# Patient Record
Sex: Male | Born: 1965
Health system: Southern US, Community
[De-identification: ages and names within clinical notes are randomized; demographics above are authoritative.]

## PROBLEM LIST (undated history)

## (undated) DIAGNOSIS — E119 Type 2 diabetes mellitus without complications: Secondary | ICD-10-CM

## (undated) DIAGNOSIS — F209 Schizophrenia, unspecified: Secondary | ICD-10-CM

## (undated) DIAGNOSIS — N179 Acute kidney failure, unspecified: Secondary | ICD-10-CM

---

## 2007-06-01 ENCOUNTER — Emergency Department: Payer: Self-pay | Admitting: Emergency Medicine

## 2007-06-08 ENCOUNTER — Inpatient Hospital Stay: Payer: Self-pay | Admitting: Psychiatry

## 2008-04-10 ENCOUNTER — Ambulatory Visit: Payer: Self-pay | Admitting: Internal Medicine

## 2008-04-29 ENCOUNTER — Ambulatory Visit: Payer: Self-pay | Admitting: Oncology

## 2008-05-15 ENCOUNTER — Ambulatory Visit: Payer: Self-pay | Admitting: Internal Medicine

## 2008-05-27 ENCOUNTER — Ambulatory Visit: Payer: Self-pay | Admitting: Oncology

## 2008-06-11 ENCOUNTER — Ambulatory Visit: Payer: Self-pay | Admitting: Gastroenterology

## 2008-06-13 ENCOUNTER — Ambulatory Visit: Payer: Self-pay | Admitting: Oncology

## 2008-06-17 ENCOUNTER — Ambulatory Visit: Payer: Self-pay | Admitting: Oncology

## 2008-06-30 ENCOUNTER — Ambulatory Visit: Payer: Self-pay | Admitting: Oncology

## 2008-07-29 ENCOUNTER — Ambulatory Visit: Payer: Self-pay | Admitting: Internal Medicine

## 2008-11-04 ENCOUNTER — Emergency Department: Payer: Self-pay | Admitting: Emergency Medicine

## 2009-02-25 ENCOUNTER — Emergency Department: Payer: Self-pay | Admitting: Emergency Medicine

## 2009-06-13 ENCOUNTER — Emergency Department: Payer: Self-pay | Admitting: Emergency Medicine

## 2009-06-29 ENCOUNTER — Emergency Department: Payer: Self-pay | Admitting: Emergency Medicine

## 2010-02-09 ENCOUNTER — Ambulatory Visit: Payer: Self-pay | Admitting: Emergency Medicine

## 2010-02-12 LAB — PATHOLOGY REPORT

## 2010-06-22 ENCOUNTER — Inpatient Hospital Stay: Payer: Self-pay | Admitting: Psychiatry

## 2010-11-22 ENCOUNTER — Emergency Department: Payer: Self-pay | Admitting: Emergency Medicine

## 2011-02-12 ENCOUNTER — Emergency Department: Payer: Self-pay | Admitting: Emergency Medicine

## 2012-05-12 ENCOUNTER — Emergency Department: Payer: Self-pay | Admitting: Emergency Medicine

## 2012-05-12 LAB — CBC
MCV: 87 fL (ref 80–100)
Platelet: 217 10*3/uL (ref 150–440)
RBC: 5.35 10*6/uL (ref 4.40–5.90)
RDW: 13.4 % (ref 11.5–14.5)
WBC: 5.4 10*3/uL (ref 3.8–10.6)

## 2012-05-12 LAB — URINALYSIS, COMPLETE
Bilirubin,UR: NEGATIVE
Blood: NEGATIVE
Glucose,UR: NEGATIVE mg/dL (ref 0–75)
Ketone: NEGATIVE
Leukocyte Esterase: NEGATIVE
Nitrite: NEGATIVE
Ph: 6 (ref 4.5–8.0)
Squamous Epithelial: <1

## 2012-05-12 LAB — COMPREHENSIVE METABOLIC PANEL
Albumin: 4.2 g/dL (ref 3.4–5.0)
Anion Gap: 7 (ref 7–16)
BUN: 11 mg/dL (ref 7–18)
Bilirubin,Total: 1.3 mg/dL — ABNORMAL HIGH (ref 0.2–1.0)
Chloride: 107 mmol/L (ref 98–107)
Co2: 29 mmol/L (ref 21–32)
Creatinine: 1.1 mg/dL (ref 0.60–1.30)
EGFR (African American): >60
Glucose: 166 mg/dL — ABNORMAL HIGH (ref 65–99)
Osmolality: 288 (ref 275–301)
Potassium: 4.1 mmol/L (ref 3.5–5.1)
SGOT(AST): 25 U/L (ref 15–37)
Sodium: 143 mmol/L (ref 136–145)
Total Protein: 7.5 g/dL (ref 6.4–8.2)

## 2012-05-12 LAB — DRUG SCREEN, URINE
Barbiturates, Ur Screen: NEGATIVE (ref ?–200)
Benzodiazepine, Ur Scrn: NEGATIVE (ref ?–200)
Methadone, Ur Screen: NEGATIVE (ref ?–300)
Opiate, Ur Screen: NEGATIVE (ref ?–300)
Phencyclidine (PCP) Ur S: NEGATIVE (ref ?–25)
Tricyclic, Ur Screen: NEGATIVE (ref ?–1000)

## 2012-05-12 LAB — DIFFERENTIAL
Basophil #: 0.1 10*3/uL (ref 0.0–0.1)
Eosinophil #: 0 10*3/uL (ref 0.0–0.7)
Lymphocyte #: 1.1 10*3/uL (ref 1.0–3.6)
Monocyte #: 0.3 x10 3/mm (ref 0.2–1.0)
Monocyte %: 4.5 %
Neutrophil #: 4.2 10*3/uL (ref 1.4–6.5)
Neutrophil %: 73.8 %

## 2012-05-12 LAB — ETHANOL
Ethanol %: <0.003 % (ref 0.000–0.080)
Ethanol: <3 mg/dL

## 2012-05-12 LAB — ACETAMINOPHEN LEVEL: Acetaminophen: <2 ug/mL

## 2012-05-13 LAB — LITHIUM LEVEL: Lithium: 0.76 mmol/L

## 2012-05-17 LAB — CBC
HGB: 13.7 g/dL (ref 13.0–18.0)
MCH: 28.7 pg (ref 26.0–34.0)
MCHC: 32.8 g/dL (ref 32.0–36.0)
MCV: 88 fL (ref 80–100)
Platelet: 194 10*3/uL (ref 150–440)
RDW: 12.8 % (ref 11.5–14.5)

## 2012-05-19 LAB — CBC
HCT: 43.9 % (ref 40.0–52.0)
HGB: 14.2 g/dL (ref 13.0–18.0)
MCH: 28.7 pg (ref 26.0–34.0)
MCHC: 32.4 g/dL (ref 32.0–36.0)
MCV: 89 fL (ref 80–100)
RBC: 4.95 10*6/uL (ref 4.40–5.90)

## 2012-05-19 LAB — COMPREHENSIVE METABOLIC PANEL
Alkaline Phosphatase: 77 U/L (ref 50–136)
Anion Gap: 3 — ABNORMAL LOW (ref 7–16)
Bilirubin,Total: 1 mg/dL (ref 0.2–1.0)
Calcium, Total: 9.4 mg/dL (ref 8.5–10.1)
Chloride: 107 mmol/L (ref 98–107)
Co2: 30 mmol/L (ref 21–32)
Creatinine: 1.1 mg/dL (ref 0.60–1.30)
EGFR (African American): >60
Osmolality: 281 (ref 275–301)
SGOT(AST): 42 U/L — ABNORMAL HIGH (ref 15–37)
SGPT (ALT): 57 U/L (ref 12–78)
Sodium: 140 mmol/L (ref 136–145)

## 2012-05-19 LAB — DIFFERENTIAL
Basophil #: 0.1 10*3/uL (ref 0.0–0.1)
Basophil %: 0.8 %
Eosinophil #: 0 10*3/uL (ref 0.0–0.7)
Lymphocyte %: 12 %
Monocyte #: 0.6 x10 3/mm (ref 0.2–1.0)
Monocyte %: 4.8 %
Neutrophil #: 9.5 10*3/uL — ABNORMAL HIGH (ref 1.4–6.5)
Neutrophil %: 82.3 %

## 2012-07-16 ENCOUNTER — Emergency Department: Payer: Self-pay | Admitting: Emergency Medicine

## 2012-07-16 LAB — URINALYSIS, COMPLETE
Bacteria: NONE SEEN
Blood: NEGATIVE
Glucose,UR: NEGATIVE mg/dL (ref 0–75)
Ketone: NEGATIVE
Ph: 7 (ref 4.5–8.0)
RBC,UR: 1 /HPF (ref 0–5)
Specific Gravity: 1.008 (ref 1.003–1.030)
WBC UR: 3 /HPF (ref 0–5)

## 2012-07-16 LAB — CBC
MCHC: 32.6 g/dL (ref 32.0–36.0)
MCV: 88 fL (ref 80–100)
Platelet: 217 10*3/uL (ref 150–440)
RBC: 5.31 10*6/uL (ref 4.40–5.90)
RDW: 13.7 % (ref 11.5–14.5)
WBC: 6.4 10*3/uL (ref 3.8–10.6)

## 2012-07-16 LAB — TSH: Thyroid Stimulating Horm: 1.61 u[IU]/mL

## 2012-07-16 LAB — COMPREHENSIVE METABOLIC PANEL
Albumin: 4.1 g/dL (ref 3.4–5.0)
Alkaline Phosphatase: 65 U/L (ref 50–136)
Anion Gap: 4 — ABNORMAL LOW (ref 7–16)
BUN: 6 mg/dL — ABNORMAL LOW (ref 7–18)
Calcium, Total: 9.1 mg/dL (ref 8.5–10.1)
Chloride: 110 mmol/L — ABNORMAL HIGH (ref 98–107)
Co2: 29 mmol/L (ref 21–32)
EGFR (Non-African Amer.): >60
Osmolality: 281 (ref 275–301)
SGOT(AST): 19 U/L (ref 15–37)
SGPT (ALT): 29 U/L (ref 12–78)
Total Protein: 7.5 g/dL (ref 6.4–8.2)

## 2012-07-16 LAB — DRUG SCREEN, URINE
Cannabinoid 50 Ng, Ur ~~LOC~~: NEGATIVE (ref ?–50)
Methadone, Ur Screen: NEGATIVE (ref ?–300)
Tricyclic, Ur Screen: NEGATIVE (ref ?–1000)

## 2012-07-16 LAB — ETHANOL
Ethanol %: <0.003 % (ref 0.000–0.080)
Ethanol: <3 mg/dL

## 2012-07-18 LAB — LITHIUM LEVEL: Lithium: 0.64 mmol/L

## 2012-08-03 ENCOUNTER — Emergency Department: Payer: Self-pay | Admitting: Emergency Medicine

## 2012-08-03 LAB — CBC
HCT: 49.3 % (ref 40.0–52.0)
HGB: 16 g/dL (ref 13.0–18.0)
MCH: 29 pg (ref 26.0–34.0)
MCV: 90 fL (ref 80–100)
RBC: 5.5 10*6/uL (ref 4.40–5.90)
RDW: 14.3 % (ref 11.5–14.5)
WBC: 5 10*3/uL (ref 3.8–10.6)

## 2012-08-03 LAB — DRUG SCREEN, URINE
Amphetamines, Ur Screen: NEGATIVE (ref ?–1000)
Benzodiazepine, Ur Scrn: NEGATIVE (ref ?–200)
Cannabinoid 50 Ng, Ur ~~LOC~~: NEGATIVE (ref ?–50)
Cocaine Metabolite,Ur ~~LOC~~: NEGATIVE (ref ?–300)
MDMA (Ecstasy)Ur Screen: NEGATIVE (ref ?–500)
Opiate, Ur Screen: NEGATIVE (ref ?–300)
Phencyclidine (PCP) Ur S: NEGATIVE (ref ?–25)

## 2012-08-03 LAB — URINALYSIS, COMPLETE
Bilirubin,UR: NEGATIVE
Blood: NEGATIVE
Glucose,UR: NEGATIVE mg/dL (ref 0–75)
Ketone: NEGATIVE
Nitrite: NEGATIVE
RBC,UR: <1 /HPF (ref 0–5)
Specific Gravity: 1.013 (ref 1.003–1.030)
WBC UR: 1 /HPF (ref 0–5)

## 2012-08-03 LAB — COMPREHENSIVE METABOLIC PANEL
Alkaline Phosphatase: 69 U/L (ref 50–136)
Anion Gap: 2 — ABNORMAL LOW (ref 7–16)
BUN: 6 mg/dL — ABNORMAL LOW (ref 7–18)
Bilirubin,Total: 1 mg/dL (ref 0.2–1.0)
Calcium, Total: 8.7 mg/dL (ref 8.5–10.1)
Co2: 31 mmol/L (ref 21–32)
Creatinine: 1.11 mg/dL (ref 0.60–1.30)
EGFR (Non-African Amer.): >60
Glucose: 92 mg/dL (ref 65–99)
Osmolality: 284 (ref 275–301)
Potassium: 3.8 mmol/L (ref 3.5–5.1)
SGOT(AST): 19 U/L (ref 15–37)
SGPT (ALT): 30 U/L (ref 12–78)
Sodium: 144 mmol/L (ref 136–145)

## 2012-08-03 LAB — TSH: Thyroid Stimulating Horm: 1.52 u[IU]/mL

## 2012-08-03 LAB — ACETAMINOPHEN LEVEL: Acetaminophen: <2 ug/mL

## 2012-08-03 LAB — ETHANOL
Ethanol %: <0.003 % (ref 0.000–0.080)
Ethanol: <3 mg/dL

## 2012-08-04 LAB — LITHIUM LEVEL: Lithium: 0.64 mmol/L

## 2012-08-14 LAB — CBC WITH DIFFERENTIAL/PLATELET
Basophil: 1 %
Comment - H1-Com2: NORMAL
Eosinophil: 1 %
HCT: 47.5 % (ref 40.0–52.0)
HGB: 15.4 g/dL (ref 13.0–18.0)
Lymphocytes: 16 %
MCH: 28.7 pg (ref 26.0–34.0)
MCHC: 32.5 g/dL (ref 32.0–36.0)
MCV: 89 fL (ref 80–100)
Monocytes: 9 %
Platelet: 178 10*3/uL (ref 150–440)
RBC: 5.37 10*6/uL (ref 4.40–5.90)
RDW: 14 % (ref 11.5–14.5)
Segmented Neutrophils: 73 %
WBC: 5.6 10*3/uL (ref 3.8–10.6)

## 2012-08-14 LAB — DIFFERENTIAL
Basophil #: 0.1 10*3/uL (ref 0.0–0.1)
Basophil %: 1.3 %
Eosinophil #: 0 10*3/uL (ref 0.0–0.7)
Eosinophil %: 0.5 %
Lymphocyte #: 1.1 10*3/uL (ref 1.0–3.6)
Lymphocyte %: 19.9 %
Monocyte #: 0.5 x10 3/mm (ref 0.2–1.0)
Monocyte %: 8.7 %
Neutrophil #: 3.9 10*3/uL (ref 1.4–6.5)
Neutrophil %: 69.6 %

## 2012-08-18 LAB — LITHIUM LEVEL: Lithium: 0.71 mmol/L

## 2013-11-14 ENCOUNTER — Emergency Department: Payer: Self-pay | Admitting: Emergency Medicine

## 2013-11-14 LAB — COMPREHENSIVE METABOLIC PANEL
AST: 24 U/L (ref 15–37)
Albumin: 4.3 g/dL (ref 3.4–5.0)
Alkaline Phosphatase: 89 U/L
Anion Gap: 5 — ABNORMAL LOW (ref 7–16)
BUN: 8 mg/dL (ref 7–18)
Bilirubin,Total: 0.7 mg/dL (ref 0.2–1.0)
CALCIUM: 9.1 mg/dL (ref 8.5–10.1)
CHLORIDE: 110 mmol/L — AB (ref 98–107)
Co2: 26 mmol/L (ref 21–32)
Creatinine: 1.12 mg/dL (ref 0.60–1.30)
EGFR (African American): >60
EGFR (Non-African Amer.): >60
Glucose: 87 mg/dL (ref 65–99)
Osmolality: 279 (ref 275–301)
POTASSIUM: 3.5 mmol/L (ref 3.5–5.1)
SGPT (ALT): 28 U/L (ref 12–78)
Sodium: 141 mmol/L (ref 136–145)
TOTAL PROTEIN: 7.8 g/dL (ref 6.4–8.2)

## 2013-11-14 LAB — DRUG SCREEN, URINE
Amphetamines, Ur Screen: NEGATIVE (ref ?–1000)
BARBITURATES, UR SCREEN: NEGATIVE (ref ?–200)
Benzodiazepine, Ur Scrn: NEGATIVE (ref ?–200)
Cannabinoid 50 Ng, Ur ~~LOC~~: NEGATIVE (ref ?–50)
Cocaine Metabolite,Ur ~~LOC~~: NEGATIVE (ref ?–300)
MDMA (ECSTASY) UR SCREEN: NEGATIVE (ref ?–500)
Methadone, Ur Screen: NEGATIVE (ref ?–300)
OPIATE, UR SCREEN: NEGATIVE (ref ?–300)
Phencyclidine (PCP) Ur S: NEGATIVE (ref ?–25)
Tricyclic, Ur Screen: NEGATIVE (ref ?–1000)

## 2013-11-14 LAB — CBC
HCT: 42.6 % (ref 40.0–52.0)
HGB: 14.2 g/dL (ref 13.0–18.0)
MCH: 29 pg (ref 26.0–34.0)
MCHC: 33.4 g/dL (ref 32.0–36.0)
MCV: 87 fL (ref 80–100)
Platelet: 197 10*3/uL (ref 150–440)
RBC: 4.9 10*6/uL (ref 4.40–5.90)
RDW: 13.6 % (ref 11.5–14.5)
WBC: 5.6 10*3/uL (ref 3.8–10.6)

## 2013-11-14 LAB — ETHANOL: Ethanol %: <0.003 % (ref 0.000–0.080)

## 2013-11-14 LAB — SALICYLATE LEVEL: SALICYLATES, SERUM: 4.5 mg/dL — AB

## 2013-11-14 LAB — TSH: THYROID STIMULATING HORM: 0.98 u[IU]/mL

## 2013-11-14 LAB — ACETAMINOPHEN LEVEL: Acetaminophen: <2 ug/mL

## 2013-11-16 LAB — URINALYSIS, COMPLETE
BACTERIA: NONE SEEN
BILIRUBIN, UR: NEGATIVE
BLOOD: NEGATIVE
GLUCOSE, UR: NEGATIVE mg/dL (ref 0–75)
Hyaline Cast: 4
KETONE: NEGATIVE
Leukocyte Esterase: NEGATIVE
Nitrite: NEGATIVE
Ph: 5 (ref 4.5–8.0)
RBC,UR: <1 /HPF (ref 0–5)
SPECIFIC GRAVITY: 1.028 (ref 1.003–1.030)
SQUAMOUS EPITHELIAL: NONE SEEN

## 2013-11-18 LAB — TROPONIN I: Troponin-I: <0.02 ng/mL

## 2013-11-22 ENCOUNTER — Emergency Department: Payer: Self-pay | Admitting: Emergency Medicine

## 2013-11-23 LAB — URINALYSIS, COMPLETE
BACTERIA: NONE SEEN
Bilirubin,UR: NEGATIVE
Blood: NEGATIVE
GLUCOSE, UR: NEGATIVE mg/dL (ref 0–75)
Ketone: NEGATIVE
Leukocyte Esterase: NEGATIVE
NITRITE: NEGATIVE
PROTEIN: NEGATIVE
Ph: 7 (ref 4.5–8.0)
RBC,UR: 1 /HPF (ref 0–5)
Specific Gravity: 1.008 (ref 1.003–1.030)
Squamous Epithelial: NONE SEEN
WBC UR: <1 /HPF (ref 0–5)

## 2013-11-23 LAB — DRUG SCREEN, URINE

## 2013-11-23 LAB — ETHANOL
Ethanol %: <0.003 % (ref 0.000–0.080)
Ethanol: <3 mg/dL

## 2013-11-23 LAB — COMPREHENSIVE METABOLIC PANEL
ALBUMIN: 4.2 g/dL (ref 3.4–5.0)
ALT: 23 U/L (ref 12–78)
AST: 25 U/L (ref 15–37)
Alkaline Phosphatase: 75 U/L
Anion Gap: 5 — ABNORMAL LOW (ref 7–16)
BUN: 11 mg/dL (ref 7–18)
Bilirubin,Total: 0.5 mg/dL (ref 0.2–1.0)
CHLORIDE: 110 mmol/L — AB (ref 98–107)
Calcium, Total: 8.9 mg/dL (ref 8.5–10.1)
Co2: 29 mmol/L (ref 21–32)
Creatinine: 1.24 mg/dL (ref 0.60–1.30)
EGFR (African American): >60
EGFR (Non-African Amer.): >60
GLUCOSE: 99 mg/dL (ref 65–99)
Osmolality: 286 (ref 275–301)
Potassium: 3.8 mmol/L (ref 3.5–5.1)
Sodium: 144 mmol/L (ref 136–145)
Total Protein: 7.6 g/dL (ref 6.4–8.2)

## 2013-11-23 LAB — CBC
HCT: 42.5 % (ref 40.0–52.0)
HGB: 13.8 g/dL (ref 13.0–18.0)
MCH: 28.7 pg (ref 26.0–34.0)
MCHC: 32.5 g/dL (ref 32.0–36.0)
MCV: 88 fL (ref 80–100)
Platelet: 216 10*3/uL (ref 150–440)
RBC: 4.82 10*6/uL (ref 4.40–5.90)
RDW: 14.2 % (ref 11.5–14.5)
WBC: 5.4 10*3/uL (ref 3.8–10.6)

## 2013-11-23 LAB — ACETAMINOPHEN LEVEL: Acetaminophen: <2 ug/mL

## 2013-11-23 LAB — SALICYLATE LEVEL: Salicylates, Serum: 1.9 mg/dL

## 2013-11-23 LAB — TSH: Thyroid Stimulating Horm: 3.25 u[IU]/mL

## 2013-11-23 LAB — LITHIUM LEVEL: Lithium: 0.45 mmol/L — ABNORMAL LOW

## 2013-11-29 LAB — LITHIUM LEVEL: Lithium: 0.72 mmol/L

## 2013-12-03 LAB — DIFFERENTIAL
BASOS ABS: 0.1 10*3/uL (ref 0.0–0.1)
Basophil %: 0.9 %
EOS PCT: 0.1 %
Eosinophil #: 0 10*3/uL (ref 0.0–0.7)
Lymphocyte #: 1.2 10*3/uL (ref 1.0–3.6)
Lymphocyte %: 22.2 %
MONO ABS: 0.4 x10 3/mm (ref 0.2–1.0)
Monocyte %: 7.7 %
Neutrophil #: 3.9 10*3/uL (ref 1.4–6.5)
Neutrophil %: 69.1 %

## 2013-12-03 LAB — WBC: WBC: 5.8 10*3/uL (ref 3.8–10.6)

## 2013-12-11 ENCOUNTER — Emergency Department: Payer: Self-pay | Admitting: Internal Medicine

## 2013-12-11 LAB — ACETAMINOPHEN LEVEL

## 2013-12-11 LAB — DRUG SCREEN, URINE

## 2013-12-11 LAB — DIFFERENTIAL
BASOS ABS: 0.1 10*3/uL (ref 0.0–0.1)
Basophil %: 2.4 %
EOS PCT: 0.4 %
Eosinophil #: 0 10*3/uL (ref 0.0–0.7)
LYMPHS PCT: 18.9 %
Lymphocyte #: 1 10*3/uL (ref 1.0–3.6)
Monocyte #: 0.3 x10 3/mm (ref 0.2–1.0)
Monocyte %: 5.4 %
Neutrophil #: 3.9 10*3/uL (ref 1.4–6.5)
Neutrophil %: 72.9 %

## 2013-12-11 LAB — URINALYSIS, COMPLETE
BLOOD: NEGATIVE
Bacteria: NONE SEEN
Bilirubin,UR: NEGATIVE
Glucose,UR: NEGATIVE mg/dL (ref 0–75)
Ketone: NEGATIVE
Leukocyte Esterase: NEGATIVE
Nitrite: NEGATIVE
Ph: 7 (ref 4.5–8.0)
Protein: NEGATIVE
RBC,UR: NONE SEEN /HPF (ref 0–5)
SQUAMOUS EPITHELIAL: NONE SEEN
Specific Gravity: 1.014 (ref 1.003–1.030)
WBC UR: 1 /HPF (ref 0–5)

## 2013-12-11 LAB — CBC
HCT: 42.5 % (ref 40.0–52.0)
HGB: 13.7 g/dL (ref 13.0–18.0)
MCH: 28.8 pg (ref 26.0–34.0)
MCHC: 32.3 g/dL (ref 32.0–36.0)
MCV: 89 fL (ref 80–100)
PLATELETS: 205 10*3/uL (ref 150–440)
RBC: 4.77 10*6/uL (ref 4.40–5.90)
RDW: 14.7 % — ABNORMAL HIGH (ref 11.5–14.5)
WBC: 5.1 10*3/uL (ref 3.8–10.6)

## 2013-12-11 LAB — ETHANOL: Ethanol: <3 mg/dL

## 2013-12-11 LAB — COMPREHENSIVE METABOLIC PANEL
ALBUMIN: 3.9 g/dL (ref 3.4–5.0)
ALK PHOS: 72 U/L
Anion Gap: 4 — ABNORMAL LOW (ref 7–16)
BUN: 8 mg/dL (ref 7–18)
Bilirubin,Total: 0.8 mg/dL (ref 0.2–1.0)
Calcium, Total: 8.6 mg/dL (ref 8.5–10.1)
Chloride: 111 mmol/L — ABNORMAL HIGH (ref 98–107)
Co2: 27 mmol/L (ref 21–32)
Creatinine: 1.53 mg/dL — ABNORMAL HIGH (ref 0.60–1.30)
EGFR (African American): >60
GFR CALC NON AF AMER: 54 — AB
GLUCOSE: 123 mg/dL — AB (ref 65–99)
Osmolality: 283 (ref 275–301)
POTASSIUM: 3.6 mmol/L (ref 3.5–5.1)
SGOT(AST): 30 U/L (ref 15–37)
SGPT (ALT): 19 U/L (ref 12–78)
Sodium: 142 mmol/L (ref 136–145)
TOTAL PROTEIN: 7.5 g/dL (ref 6.4–8.2)

## 2013-12-11 LAB — LITHIUM LEVEL: LITHIUM: 0.76 mmol/L

## 2013-12-11 LAB — SALICYLATE LEVEL

## 2014-02-06 ENCOUNTER — Emergency Department: Payer: Self-pay | Admitting: Emergency Medicine

## 2014-02-06 LAB — COMPREHENSIVE METABOLIC PANEL
ALBUMIN: 4 g/dL (ref 3.4–5.0)
ALT: 27 U/L
Alkaline Phosphatase: 68 U/L
Anion Gap: 7 (ref 7–16)
BUN: 10 mg/dL (ref 7–18)
Bilirubin,Total: 0.9 mg/dL (ref 0.2–1.0)
CO2: 28 mmol/L (ref 21–32)
CREATININE: 1.27 mg/dL (ref 0.60–1.30)
Calcium, Total: 9.1 mg/dL (ref 8.5–10.1)
Chloride: 109 mmol/L — ABNORMAL HIGH (ref 98–107)
EGFR (African American): >60
EGFR (Non-African Amer.): >60
Glucose: 99 mg/dL (ref 65–99)
Osmolality: 286 (ref 275–301)
Potassium: 4.1 mmol/L (ref 3.5–5.1)
SGOT(AST): 24 U/L (ref 15–37)
SODIUM: 144 mmol/L (ref 136–145)
Total Protein: 7.6 g/dL (ref 6.4–8.2)

## 2014-02-06 LAB — URINALYSIS, COMPLETE
Bacteria: NONE SEEN
Bilirubin,UR: NEGATIVE
Blood: NEGATIVE
GLUCOSE, UR: NEGATIVE mg/dL (ref 0–75)
KETONE: NEGATIVE
Leukocyte Esterase: NEGATIVE
Nitrite: NEGATIVE
PH: 6 (ref 4.5–8.0)
PROTEIN: NEGATIVE
SPECIFIC GRAVITY: 1.015 (ref 1.003–1.030)
Squamous Epithelial: <1

## 2014-02-06 LAB — CBC
HCT: 45.9 % (ref 40.0–52.0)
HGB: 14.6 g/dL (ref 13.0–18.0)
MCH: 28 pg (ref 26.0–34.0)
MCHC: 31.8 g/dL — ABNORMAL LOW (ref 32.0–36.0)
MCV: 88 fL (ref 80–100)
Platelet: 229 10*3/uL (ref 150–440)
RBC: 5.21 10*6/uL (ref 4.40–5.90)
RDW: 13.4 % (ref 11.5–14.5)
WBC: 7.6 10*3/uL (ref 3.8–10.6)

## 2014-02-06 LAB — ETHANOL: Ethanol: <3 mg/dL

## 2014-02-06 LAB — DRUG SCREEN, URINE
Amphetamines, Ur Screen: NEGATIVE (ref ?–1000)
Barbiturates, Ur Screen: NEGATIVE (ref ?–200)
Benzodiazepine, Ur Scrn: POSITIVE (ref ?–200)
COCAINE METABOLITE, UR ~~LOC~~: NEGATIVE (ref ?–300)
Cannabinoid 50 Ng, Ur ~~LOC~~: NEGATIVE (ref ?–50)
MDMA (Ecstasy)Ur Screen: NEGATIVE (ref ?–500)
Methadone, Ur Screen: NEGATIVE (ref ?–300)
OPIATE, UR SCREEN: NEGATIVE (ref ?–300)
Phencyclidine (PCP) Ur S: NEGATIVE (ref ?–25)
TRICYCLIC, UR SCREEN: NEGATIVE (ref ?–1000)

## 2014-02-06 LAB — DIFFERENTIAL
BASOS PCT: 1.2 %
Basophil #: 0.1 10*3/uL (ref 0.0–0.1)
EOS PCT: 0.5 %
Eosinophil #: 0 10*3/uL (ref 0.0–0.7)
Lymphocyte #: 1.8 10*3/uL (ref 1.0–3.6)
Lymphocyte %: 23.4 %
MONO ABS: 0.5 x10 3/mm (ref 0.2–1.0)
Monocyte %: 6.8 %
NEUTROS PCT: 68.1 %
Neutrophil #: 5.2 10*3/uL (ref 1.4–6.5)

## 2014-02-06 LAB — LITHIUM LEVEL: LITHIUM: 0.91 mmol/L

## 2014-02-06 LAB — SALICYLATE LEVEL: Salicylates, Serum: <1.7 mg/dL

## 2014-02-06 LAB — ACETAMINOPHEN LEVEL: Acetaminophen: <2 ug/mL

## 2014-09-16 NOTE — Consult Note (Signed)
PATIENT NAME:  Nicholas Cole, STAMLER MR#:  [id number] DATE OF BIRTH:  06-03-1965  DATE OF CONSULTATION:  05/12/2012  REFERRING PHYSICIAN:   CONSULTING PHYSICIAN:  Harneet Noblett K. Homer Miller, MD  SUBJECTIVE: The patient is a 49 year old African American male with a long history of schizoaffective disorder and has been living in a group home.  According to information obtained, the patient refuses medication which is Clozaril last night and was threatening to walk into traffic and when the patient was asked he reported "they can blow the horn".  He reports that he will take all of the medications, except Clozaril because it makes him feel paranoid. The patient was confronted and informed about the importance of taking Clozaril.  OBJECTIVE: Dressed in hospital scrubs, alert and oriented. Appears calm, pleasant and cooperative.  However, he gets agitated and disruptive very easily and difficult to redirect and becomes very argumentative with the staff and behavior gets out of control and he has been doing this during previous admissions.  He is paranoid and suspicious, although he reports that Clozaril causes it.  He is probably psychotic, although he denies hearing voices or seeing things and not now, but he does have it.  He does admit to thought control and thought insertion. He is upset and unhappy about being here. He knew the reason why he was admitted. Behavior is unpredictable and could be dangerous to himself and others. Insight and judgment are guarded versus impaired.   IMPRESSION: Schizoaffective disorder with psychosis.   RECOMMENDATION: Continue all of the medications and try to give him Clozaril because this is a very efficacious medication. We will await transfer to [facility] when a bed is available.   ____________________________ Wallace Cullens. Franchot Mimes, MD skc:sb D: 05/12/2012 20:04:54 ET     T: 05/13/2012 14:21:19 ET       JOB#: [id number] cc: Arlyn Leak K. Franchot Mimes, MD, <Dictator> Dewain Penning  MD ELECTRONICALLY SIGNED 05/13/2012 21:30

## 2014-09-19 NOTE — Consult Note (Signed)
Brief Consult Note: Diagnosis: Schizophrenia.   Patient was seen by consultant.   Consult note dictated.   Recommend further assessment or treatment.   Orders entered.   Comments: Mr. Heino has a h/o schoziphrenia. He was brought to the ER after an argument with a peer at the group home. He remains disorganized and has poor insight. he was taking Clozaril in the past which was discontinued at [facility]. He continues to have behavioral problems and is now on wait list for [facility].   PLAN: 1. The patient is on IVC.    Continue medications and will adjust the doses according to his behavior.  Awaiting placement at [facility].  Electronic Signatures: Jeronimo Norma (MD)  (Signed 11-Mar-14 16:56)  Authored: Brief Consult Note   Last Updated: 11-Mar-14 16:56 by Jeronimo Norma (MD)

## 2014-09-19 NOTE — Consult Note (Signed)
Brief Consult Note: Diagnosis: Schizophrenia, PT.   Patient was seen by consultant.   Recommend further assessment or treatment.   Comments: Nicholas Cole has been compliant with treatment. He is cool and collected. no unwanted behaviors. He has shown significant improvement.   MSE: Alert, oriented, leasant, cooperative. No safety issues.   PLAN: 1. The patient is IVC.   2. We are in contact with Richardson Landry from the [facility] today. He will pick the patient up on Monday to attentd his appointment at 14:00.   3. We will continue all his medications. Li level is therapeutic.   4. Psychiatry will follow up.  Electronic Signatures: Orson Slick (MD)  (Signed 22-Feb-14 01:45)  Authored: Brief Consult Note   Last Updated: 22-Feb-14 01:45 by Orson Slick (MD)

## 2014-09-19 NOTE — Consult Note (Signed)
Brief Consult Note: Diagnosis: Schizophrenia.   Patient was seen by consultant.   Recommend further assessment or treatment.   Orders entered.   Comments: Mr. Gasbarro has a h/o schoziphrenia. He remains delusional and has been disorganized at times. He will be started on Clozaril today as he remains disorganized and stated that he has taken Clozaril for years in the past. No acute SI/HI or plans noted.  PLAN: 1. The patient is on IVC.    Order baseline CBC with diff to calculate ANC  Start Clozail 12.5mg  po BID with gradual titration on a daily basis.  Will repeat labs in a week.  Monitor for adverse effects.  Continue medications at this time.  Awaiting placement at [facility].  Electronic Signatures: Jeronimo Norma (MD)  (Signed 18-Mar-14 21:22)  Authored: Brief Consult Note   Last Updated: 18-Mar-14 21:22 by Jeronimo Norma (MD)

## 2014-09-19 NOTE — Consult Note (Signed)
Brief Consult Note: Diagnosis: Schizophrenia.   Patient was seen by consultant.   Consult note dictated.   Recommend further assessment or treatment.   Orders entered.   Comments: Nicholas Cole has a h/o schoziphrenia. He was brought to the ER after an argument with a peer at the group home. He felt that he was disrespected. he is slightly agiated in the ER but no major behavioral roblems. He does not want to return to the group hoe and wants "a condo".   PLAN: 1. The patient is on IVC.     2. We will continue all medications as they appear in our system. We are unaware of any changes as the phones are down due to power shortage. Will contact his group home ASAP.    3. Lithium level not done on admission. Will recheck.   4. Psychiatry will follow.  Electronic Signatures: Orson Slick (MD)  (Signed 08-Mar-14 07:56)  Authored: Brief Consult Note   Last Updated: 08-Mar-14 07:56 by Orson Slick (MD)

## 2014-09-19 NOTE — Consult Note (Signed)
PATIENT NAME:  Nicholas Cole, Nicholas Cole OF BIRTH:  09/26/1965 OF ADMISSION:  03/07/2014OF CONSULTATION:  08/04/2012 PHYSICIAN:      Lavonia Drafts, MDPHYSICIAN:  Wardell Honour. Pucilowska, MD FOR CONSULTATION: To evaluate agitated patient.  DATA: The patient is a 49 year old male with history of paranoid schizophrenia.  COMPLAINT: "They called the police."  OF PRESENT ILLNESS:  The patient has a long history of mental illness with multiple state hospital admissions. He was discharged from [facility] ER on February 24 after he was stabilized here. He was moved to a new group home 2 days ago and felt disrespected by a new peer. He became agitated and threatening and was brought to our ER. He was loud, demanding, intrusive with pressured speech and racing thoughts. Of note, during his ost recent [facility] hospitalization, the Clozaril was discontinued and haldol initiated. It is unclear why this happened. Reportedly, he has been compliant with medicines.  The patient does have a history of assaultive behavior towards staff while at [facility].  As the patient denies any symptoms of anxiety, depression or psychosis. He wants to be discharged to "a condo".  PSYCHIATRIC HISTORY: There are numerous hospitalizations at the [facility]. He did well on Clozaril.  He has been tried on numerous medications:  Haldol, Haldol decanoate, Prolixin, lithium, Depakote, Abilify, Clozaril and Risperdal. There were suicide attempts by wrist cutting. There is a history of substance abuse with alcohol, opiates, cocaine and marijuana but apparently he has been clean of substances for years now.  PSYCHIATRIC HISTORY: None reported.  MEDICAL HISTORY: Dyslipidemia, GERD.   ALLERGIES: No known drug allergies.   MEDICATIONS ON ADMISSION:   ASA 81 mg daily.  Abilify 30 mg daily.  Fish Oil 1000 mg twice daily.  Omeprazole 20 mg twice daily.  Lorazepam 1 mgtwice daily as needed.  Lithium 300 mg twice daily. Lisinopril 2.5 mg daily.   Klonopin 1 mg twice daily.  Haldol 5 mg three times daily. HISTORY: The patient has been disabled. He has been a resident of multiple group homes. He was in prison and was treated for psychotic symptoms while at the prison.  He likes to work when possible and likes to save money but has not been able to do so lately. OF SYSTEMS:No fevers or chills. No weight changes. No double or blurred vision. No hearing loss. No shortness of breath or cough. No chest pain or orthopnea. No abdominal pain, nausea, vomiting or diarrhea. No incontinence or frequency. No heat or cold intolerance. No anemia or easy bruising. No acne or rash. No muscle or joint pain. No tingling or weakness. See history of present illness for details.   EXAMINATION: SIGNS: Blood pressure 168/95, pulse 76, respirations 18.  This is a well-developed male in no acute distress. The rest of the physical examination is deferred to his primary attending.  DATA: Chemistries are within normal limits.  Blood alcohol level is 0. LFTs within normal limits. TSH 1.52. Urine tox screen negative for substances. CBC within normal limits. Lithium level 64. Urinalysis is not suggestive of urinary tract infection.  STATUS EXAMINATION: The patient is alert and oriented to person, place and somewhat to situation. He is eating dinner with appetite. He recognizes me from previous admissions. He is pleasant. He maintains good eye contact. Grooming is good. His speech is pressured. Mood is "fine" with expansive affect. Thoughts are racing. There are no safety issues. He denies psychotic symptoms but is grandiose. His cognition is grossly intact. His insight and  judgment are nonexistent.  RISK ASSESSMENT ON ADMISSION:  This is a patient with a long history of schizophrenia and multiple suicide attempts who has become agitated and unruly following recent relocation.   I:  Schizoaffective disorder, bipolar type.    Remote history of polysubstance dependence. II: Deferred.  III:  Hypertension.   Gastroesophageal reflux disease. IV:  Mental illness, recent medication changes, primary support, conflict at the    group home. V:  GAF 25.    The patient is on IVC awaiting transfer to [facility] if necessary. The patient is allowed to return to the group home, but he does not want to.   We will restart all his medications and observe in the Emergency Room with the hope that we will be able to discharge him.    I will follow up.    Electronic Signatures: Orson Slick (MD)  (Signed on 11-Mar-14 08:15)  Authored  Last Updated: 11-Mar-14 08:15 by Orson Slick (MD)

## 2014-09-19 NOTE — Consult Note (Signed)
Brief Consult Note: Diagnosis: Schizophrenia, PT.   Patient was seen by consultant.   Consult note dictated.   Recommend further assessment or treatment.   Orders entered.   Comments: Mr. Carpenito has a h/o schoziphrenia. He was brought to the ER after he refused to follow house rules at his new group home. He has been compliant with treatment in the ER. He is cool and collected. No unwanted behaviors. He has shown significant improvement.   MSE: Alert, oriented, leasant, cooperative. No safety issues.   PLAN: 1. The patient no longer meets criteria for IVC. I will terminate proceedings. Please discharge as appropriate.    2. He is to cvontinue all medications as prescribed in the ER. FL2 form completed.   3. Richardson Landry from the [facility] will pick him up today.   4. Follow up appointment with his ACTT team in [place] is tomorrowe at 14:00.  Electronic Signatures: Orson Slick (MD)  (Signed 24-Feb-14 13:08)  Authored: Brief Consult Note   Last Updated: 24-Feb-14 13:08 by Orson Slick (MD)

## 2014-09-19 NOTE — Consult Note (Signed)
Details:   - Psychiatry: Followup visit for this patient with schizoaffective disorder currently in the emergency room. Patient has been in the emergency room for several days awaiting transfer to the state hospital. He presented to our facility agitated and aggressive and no longer able to stay at the group home where he had been for only 2 days. Today the patient presents as a reasonably well-groomed man who looks his stated age. He was cooperative for the most part with the interview. Made good eye contact. Psychomotor activity mildly fidgety but not aggressive. Thought slightly disorganized but not grossly bazaar. Denied having any current hallucinations. Denied suicidal or homicidal ideation. He stated that he was agreeable to taking his medicine. He says that he does not want to go back to the state hospital. Unfortunately the patient has had no ability to stay in a outpatient setting recently because of his aggressive behavior. He has been compliant with mood stabilizers and antipsychotics in the emergency room. Plan currently is for transfer to [facility] although we might consider discharge to another palpation facility if he is safe plan could be identified. We can look at this after the weekend. Meanwhile supportive therapy and education done continue current medicine.   Electronic Signatures: Gonzella Lex (MD)  (Signed 16-Mar-14 00:53)  Authored: Details   Last Updated: 16-Mar-14 00:53 by Gonzella Lex (MD)

## 2014-09-19 NOTE — Consult Note (Signed)
Brief Consult Note: Diagnosis: Schizophrenia, PT.   Patient was seen by consultant.   Recommend further assessment or treatment.   Comments: Pt is IVC. He is doing much better and has shown significant improvement. he will be evalauted by the [facility] today. he is complaint with meds and his Nicoletta Dress level is therapeutic. No change in meds.  Will be discharged if he is stable and accepted by the group home for placement.  Electronic Signatures: Jeronimo Norma (MD)  (Signed 20-Feb-14 09:36)  Authored: Brief Consult Note   Last Updated: 20-Feb-14 09:36 by Jeronimo Norma (MD)

## 2014-09-19 NOTE — Consult Note (Signed)
PATIENT NAME:  Nicholas Cole, Nicholas Cole MR#:  [id number] DATE OF BIRTH:  June 18, 1965  DATE OF CONSULTATION:  07/16/2012  REFERRING PHYSICIAN:      Lavonia Drafts, MD CONSULTING PHYSICIAN:  Shelina Luo B. Shadee Montoya, MD  REASON FOR CONSULTATION: To evaluate agitated patient.   IDENTIFYING DATA: The patient is a 49 year old male with history of paranoid schizophrenia.   CHIEF COMPLAINT: "They called the police."   HISTORY OF PRESENT ILLNESS:  The patient has a long history of mental illness. He was hospitalized at [facility] since December until January 30th.  He was then discharged to a group home.  During extended hospitalization at [facility], the patient was switched from Clozaril to Haldol.  It is unclear why this happened.  I remember this patient from our Emergency Room in December. At that time he felt that Clozaril made him paranoid and refused to take it.  Haldol is no match for Clozaril so it is unclear whether or not the patient is stable on a new medication, especially that he is taking by mouth. Reportedly, he has been compliant with medicines.  Reportedly, the patient has been behaving poorly lately. He was transferred from one group home to another one owned by the same owner. Apparently, he did not like the new group home rules and when told that he has to go to daytime programming, he refused and threatened to call the police.  The group home staff ended up calling police on him. It is not unusual when they residents participate in daytime programming no staff member is left behind at the group home to supervise anybody who is noncompliant with the daytime program. It is quite possible that the patient really is being forced to participate.  I do not know whether he is able to do so, as we do not know his current baseline.  The patient does have a history of assaultive behavior towards staff at the area in [facility].  As the patient denies any symptoms of  psychosis or depression, he feels that he is okay to be discharged from the hospital; however, he was agitated in the Emergency Room and was given an injection of Geodon.   PAST PSYCHIATRIC HISTORY: There are numerous hospitalizations at the [facility]. He did well on Clozaril.  He has been tried on numerous medications:  Haldol, Haldol decanoate, Prolixin, lithium, Depakote, Abilify, Clozaril and Risperdal. There were suicide attempts by wrist cutting. There is a history of substance abuse with alcohol, opiates, cocaine and marijuana but apparently he has been clean of substances for years now.   FAMILY PSYCHIATRIC HISTORY: None reported.   PAST MEDICAL HISTORY: Dyslipidemia, GERD.   ALLERGIES: No known drug allergies.   MEDICATIONS ON ADMISSION:  Lisinopril 2.5 mg daily, Haldol 10 mg twice daily, Ativan 2 mg every 6 hours as needed for agitation, Cogentin 1 mg twice daily, Klonopin 1 mg twice daily, MiraLAX 17 grams daily, omeprazole 20 mg twice daily, lithium carbonate 300 mg 3 times daily, Abilify 30 mg daily, aspirin 81 mg daily, hemorrhoidal ointment as needed.   SOCIAL HISTORY: The patient has been disabled. He has been a resident of multiple group homes. He was in prison and was treated for psychotic symptoms while at the prison.  He likes to work when possible and likes to save money but has not been able to do so lately.  REVIEW OF SYSTEMS: CONSTITUTIONAL: No fevers or chills. No weight changes.  EYES: No double or blurred  vision.  ENT: No hearing loss.  RESPIRATORY: No shortness of breath or cough.  CARDIOVASCULAR: No chest pain or orthopnea.  GASTROINTESTINAL: No abdominal pain, nausea, vomiting or diarrhea.  GENITOURINARY: No incontinence or frequency.  ENDOCRINE: No heat or cold intolerance.  LYMPHATIC: No anemia or easy bruising.  INTEGUMENTARY: No acne or rash.  MUSCULOSKELETAL: No muscle or joint pain.  NEUROLOGIC: No tingling or weakness.  PSYCHIATRIC: See  history of present illness for details.    PHYSICAL EXAMINATION:  VITAL SIGNS: Blood pressure 130/91, pulse 63, respirations 18, temperature 98.1.  GENERAL: This is a well-developed male in no acute distress. The rest of the physical examination is deferred to his primary attending.   LABORATORY DATA: Chemistries are within normal limits.  Blood alcohol level is 0. LFTs within normal limits except for bilirubin of 1.6. TSH 1.61. Urine tox screen negative for substances. CBC within normal limits. Urinalysis is not suggestive of urinary tract infection.   MENTAL STATUS EXAMINATION: The patient is oversedated and not really able to participate.  I will attempt to evaluate him tomorrow.   SUICIDE RISK ASSESSMENT ON ADMISSION:  This is a patient with a long history of schizophrenia and multiple suicide attempts who has become agitated and unruly following recent medication changes.   DIAGNOSES: AXIS I:  Schizoaffective disorder, bipolar type.    Remote history of polysubstance dependence.  AXIS II: Deferred.  AXIS III:  Hypertension.   Gastroesophageal reflux disease.  AXIS IV:  Mental illness, recent medication changes, primary support, conflict at the    group home.  AXIS V:  GAF 25.   PLAN: 1.  The patient is allowed to return to the group home, but he does not want to.  2.  We will restart all his medications and observe in the Emergency Room with the hope that we will be able to discharge him; if not he will go to [facility].  3.  I will follow up.      ____________________________ Herma Ard B. Bary Leriche, MD jbp:ct D: 07/16/2012 21:20:29 ET T: 07/17/2012 09:02:42 ET JOB#: [id number]  cc: Beverlie Kurihara B. Bary Leriche, MD, <Dictator> Clovis Fredrickson MD ELECTRONICALLY SIGNED 07/30/2012 7:49

## 2014-09-19 NOTE — Consult Note (Signed)
Brief Consult Note: Diagnosis: Schizophrenia.   Patient was seen by consultant.   Recommend further assessment or treatment.   Orders entered.   Comments: Mr. Rylee has a h/o schoziphrenia. He has been more agitated and code 300 was called twice yesterday as he was throwing things arounds. He did not respond well to the trial of Clozaril. He appeared very agitated during the interview and was not cooperative. He is not sleeping well.   PLAN: 1. The patient is on IVC.    2. I will d/c Clozaril and adjust his medications as follows Titrate Haldol 10mg  po TID Benadryl 1mg  po BID Lithium 300 mg po TID Zyrexa 5mg  IM q4-6 prn for agitation.  Pt is awaiting placement at [facility].  Electronic Signatures: Jeronimo Norma (MD)  (Signed 20-Mar-14 10:33)  Authored: Brief Consult Note   Last Updated: 20-Mar-14 10:33 by Jeronimo Norma (MD)

## 2014-09-19 NOTE — Consult Note (Signed)
Brief Consult Note: Diagnosis: Schizophrenia.   Patient was seen by consultant.   Consult note dictated.   Recommend further assessment or treatment.   Orders entered.   Comments: Mr. Scheidt has a h/o schoziphrenia. He remains delusional and has been disorganized at times. He has been started on Haldol as he reported that Clozail was making him more psychotic. He did not exhibit any acute SI/HI or plans.   PLAN: 1. The patient is on IVC.    Continue medications at this time.  Awaiting placement at [facility].  Electronic Signatures: Jeronimo Norma (MD)  (Signed 13-Mar-14 12:26)  Authored: Brief Consult Note   Last Updated: 13-Mar-14 12:26 by Jeronimo Norma (MD)

## 2014-09-19 NOTE — Consult Note (Signed)
PATIENT NAME:  Nicholas Cole, Nicholas Cole MR#:  [id number] DATE OF BIRTH:  [birth year]  DATE OF CONSULTATION:  08/18/2012  REFERRING PHYSICIAN:   CONSULTING PHYSICIAN:  Lizza Huffaker K. Franchot Mimes, MD  PLACE OF DICTATION:  [facility], [facility], [place], [place].  AGE:  49 years.  SEX:  Male.  RACE:  African American.  SUBJECTIVE:  The patient was seen in follow-up for his paranoid schizophrenia.  Staff reports that he needs as needed medication which is Geodon constantly.  The patient reports that he was given Clozaril at [facility] and refuses to take the medication.  According to information obtained he was given Clozaril to calm him down and control his agitation and psychosis as a last resort, but patient absolutely refuses to take this medication and reports the current medications are helping him the best.    OBJECTIVE:  Dressed in hospital clothes.  Alert and oriented with prompting and help.  Very agitated and psychotic with the paranoid thoughts and delusions.  Gets agitated really quickly and is hard to redirect.  Insight and judgment impaired.    IMPRESSION:  Schizophrenia, chronic, paranoia, in exacerbation.  PLAN:  Continue current medications.  We will add Thorazine 100 mg by mouth twice daily and 200 mg at bedtime.   stat dose of 200 mg Thorazine at bedtime will be given by mouth.  If patient refuses by mouth this can be given IM.  Hopefully this will calm him down as patient constantly refuses to take Clozaril.     ____________________________ Wallace Cullens. Franchot Mimes, MD skc:ea D: 08/18/2012 22:14:30 ET T: 08/19/2012 03:52:14 ET JOB#: [id number]  cc: Arlyn Leak K. Franchot Mimes, MD, <Dictator> Dewain Penning MD ELECTRONICALLY SIGNED 08/19/2012 18:58

## 2014-09-19 NOTE — Consult Note (Signed)
Brief Consult Note: Diagnosis: Schizophrenia, Paranoid Type.   Patient was seen by consultant.   Recommend further assessment or treatment.   Orders entered.   Comments: Pt awaiting Placement. He is IVC He was evaluated and notes reviewed.  Haldol dose adjusted to 5mg  po BID.  Continue other medications.  Will continue to monitor.  Electronic Signatures: Jeronimo Norma (MD)  (Signed 18-Feb-14 10:13)  Authored: Brief Consult Note   Last Updated: 18-Feb-14 10:13 by Jeronimo Norma (MD)

## 2014-09-20 NOTE — Consult Note (Signed)
PATIENT NAME:  Nicholas Cole, Nicholas Cole MR#:  [id number] DATE OF BIRTH:  Nov 14, 1965  DATE OF CONSULTATION:  12/11/2013  REFERRING PHYSICIAN:   CONSULTING PHYSICIAN:  Gonzella Lex, MD  IDENTIFYING INFORMATION AND REASON FOR CONSULTATION: A 49 year old man with a history of schizoaffective disorder was brought into the Emergency Room today after the law was called. He had a misunderstanding and what sounds like a minor confrontation with people from his group home.  Unfortunately, it happened in public and so the police got involved. Consultation for appropriate disposition.   History obtained from the chart and from the patient and through secondhand collateral history. The patient apparently was taken for a ride by his group home today to accompany them while running errands. This had to be done because there were no extra staff to stay at home. It sounds like the patient was getting restless and he left van during one of the stops and then refuse to get back in. Somehow this turned into an argument about his money. The law was called. It does not sound like he actually did anything violent or threatening.  [name] tells me that since leaving and going to this new group home, he has been feeling a little bit more irritable. He still hears voices, but those have been a constant for him and never really gone away. He still gets angry at times, but has not actually gone off and does not have any specific homicidal ideation. Does not have any suicidal ideation. He has been compliant with all of his medicines, but has not yet seen a doctor. He is not abusing any substances.   PAST PSYCHIATRIC HISTORY: Long history of schizoaffective disorder and multiple medications in the past. [name] has a tendency to become loud and angry quite easily, but usually does not actually become physically aggressive. Several prior hospitalizations including lengthy stays at [facility]. He has done well on clozapine the  past.   PAST MEDICAL HISTORY: Mild hypertension.   SOCIAL HISTORY: Currently living in a new group home. Things seem to be working out reasonably well.   SUBSTANCE ABUSE HISTORY: No significant history of substance abuse problems.   CURRENT MEDICATIONS: Clozapine 25 mg twice a day, lithium 300 mg 3 times a day, trazodone 100 mg at night, omega 3 fatty acid 1 tablet b.i.d., lisinopril 2.5 mg per day, Atarax 50 mg 3 times a day, and Abilify 30 mg per day.   ALLERGIES: NO KNOWN DRUG ALLERGIES.   REVIEW OF SYSTEMS: Feeling angry and admits that he has been having trouble sleeping at night. Chronic auditory hallucinations. No other specific physical complaints.   A neatly groomed gentleman, looks his stated age. He was yelling at me when I first walked in the door, but when I reassured him that I wanted to work on getting him discharged, he calmed down immediately. Eye contact intermittent. Psychomotor activity sluggish. Speech is easy to understand, decreased in total amount. Affect he is usually either angry or blunted with [name].  Mood is stated as all right. Thoughts are a little bit paranoid, but he did not make bizarre statements and he is able to follow a logical conversation. Chronic auditory hallucinations. No suicidal ideation. No homicidal ideation, but does lose his temper at times. Alert and oriented x 4. Normal intelligence. Short and long-term memory intact.   LABORATORY RESULTS: Standard labs done on admission: Alcohol level negative. Chemistry panel: Elevated glucose 123, creatinine up at 1.53 CBC all normal. Urinalysis  unremarkable. Drug screen negative.   ASSESSMENT: A 49 year old man with schizoaffective disorder. Got into a bit of a blow-up out in public today. Currently, his mental status is more or less where it was when he was discharged from the Emergency Room just about a week ago. That is to say that it is easy for  him to get irritated, but when handled correctly, it is  also usually pretty easy to get him calmed down and he is perfectly intelligent and able to be reasoned with. Everyone admits that the patient has not been sleeping well at home. His group home owner has reportedly said she would be happy to take him back if we can try and help him to sleep a little better.   TREATMENT PLAN: Does not need hospitalization. I am going to make a couple of changes to his medicine. I am going to switch the whole clozapine dose to nighttime and increase his dose to 100 mg.  I am also going to increase the trazodone to 150 mg at night and had Restoril 30 mg at night. All that ought to help him sleep better and hopefully we can discharge him tomorrow. The patient is agreeable to all of this.   DIAGNOSIS, PRINCIPAL AND PRIMARY:  AXIS I: Schizoaffective disorder, bipolar type.   SECONDARY DIAGNOSES:  AXIS I: No further.  AXIS II: No diagnosis.  AXIS III: Mild high blood pressure.  AXIS IV: Moderate to severe from chronic institutionalization.  AXIS V: Functioning at time of evaluation 70.   ____________________________ Gonzella Lex, MD jtc:ts D: 12/11/2013 18:18:02 ET T: 12/11/2013 18:39:25 ET JOB#: [id number]  cc: Gonzella Lex, MD, <Dictator> Gonzella Lex MD ELECTRONICALLY SIGNED 12/15/2013 12:42

## 2014-09-20 NOTE — Consult Note (Signed)
Brief Consult Note: Diagnosis: Schizophrenia, PT.   Patient was seen by consultant.   Recommend further assessment or treatment.   Orders entered.   Comments: Pt seen in ED BHU. He stated that he came here as he was having problems at the group home. he gave $1200 to the lady at group home as she is 47 years old. he was not paying his rent as he already did the same. He reported that he has been taking his meds. He called the EMS as he wanted to go the [facility]. he appeared anxious during the interview. He denied any perceptual disturbances. No behavioral problems noted at this time. Has h/o violence in the past.   Plan: Will titrate the Lithium Carbonate 300mg  po TID Order Li level on 6/ 25/15 in am  If pt responds to the medications, he can be discharged safely.  He will continue other meds as prescribed.  Will continue to monitor.  Electronic Signatures: Jeronimo Norma (MD)  (Signed 23-Jun-15 11:04)  Authored: Brief Consult Note   Last Updated: 23-Jun-15 11:04 by Jeronimo Norma (MD)

## 2014-09-20 NOTE — Consult Note (Signed)
Brief Consult Note: Diagnosis: Schizophrenia, PT.   Patient was seen by consultant.   Recommend further assessment or treatment.   Orders entered.   Comments: Pt seen in ED BHU. During my interview, he was noted to be calm and was asking about his discharge planning. He stated that he is doing well on his medications. He reported that he slept well. he does not appear to be agitated that this time and is very calm and sitting in the day room watching TV. He follows with [facility]  team  Plan: Will continue  Lithium Carbonate 300mg  po TID Will order Li level for AM.  He will continue other meds as prescribed.  Will continue to monitor.  Electronic Signatures: Jeronimo Norma (MD)  (Signed 02-Jul-15 10:08)  Authored: Brief Consult Note   Last Updated: 02-Jul-15 10:08 by Jeronimo Norma (MD)

## 2014-09-20 NOTE — Consult Note (Signed)
Psychiatry: Follow-up for this 49 year old gentleman with schizophrenia.  Patient has been waiting for an extended period of time in the emergency room because of difficulty with placement.  Patient complains of feeling agitated confused and angry at times today.  No new specific physical complaints. mental status he is a reasonably well-groomed gentleman who is easy to engage as long as he is not in her rage at the moment.  He makes good eye contact.  Psychomotor activity remains a little fidgety.  Speech can be pressured but he is able to get it under control.  Affect tends to be irritated and constricted.  Denies suicidal or homicidal ideation.  He is labile in his mood.  He is easy to lose his temper but then can usually be calm down pretty quickly.  Denies suicidal or homicidal ideation.  Endorses hallucinations.  Thoughts are disorganized and paranoid but his insight is pretty good about it.  He is alert and oriented 4 and of normal intelligence. is a gentleman with chronic schizophrenia and intermittent behavior problems that have caused him to have a difficult time staying in a stable environment.  As far as treatment he asked me today why he wasn't taking his clozapine.  I looked back over his records and it appears that he was last on clozapine about a year ago and it was discontinued because he was refusing it.  History would suggest that he actually did very well when he was taking clozapine and there is no evidence or history that he had an adverse reaction to it.  Therefore, I am restarting him on 25 mg of clozapine a day in addition to his current medication.  Lots of supportive counseling done with the patient. far as discharge, we have contacted a group home who might be willing to consider him and they are supposed to come by and talk to him today or tomorrow.  If this does not work out he is also on the list for [facility]. time spent today diagnosis schizoaffective  disorder  Electronic Signatures: Clapacs, Madie Reno (MD)  (Signed on 07-Jul-15 17:21)  Authored  Last Updated: 07-Jul-15 17:21 by Gonzella Lex (MD)

## 2014-09-20 NOTE — Consult Note (Signed)
Brief Consult Note: Diagnosis: schizophrenia.   Patient was seen by consultant.   Consult note dictated.   Recommend further assessment or treatment.   Comments: Psychiatry: PAtient seen and chart reviewed and note dictated. Patient long hx schizophrenia. Has decompensated with agitated behavior and psychosis. Inappropriate for inpt unit. Continue current medication.  Electronic Signatures: Ovadia Lopp, Madie Reno (MD)  (Signed 10-Sep-15 16:54)  Authored: Brief Consult Note   Last Updated: 10-Sep-15 16:54 by Gonzella Lex (MD)

## 2014-09-20 NOTE — Consult Note (Signed)
PATIENT NAME:  Nicholas Cole Cole, Nicholas Cole MR#:  [id number] DATE OF BIRTH:  December 20, 1965  DATE OF CONSULTATION:  11/23/2013  REFERRING PHYSICIAN:   CONSULTING PHYSICIAN:  Kahil Agner K. Franchot Mimes, MD  SEX: Male.  RACE: African-American.  SUBJECTIVE: The patient was seen in consultation at [facility] [place]. The patient is a 49 year old African-American male with a long history of mental illness and has been living at the current group home called [facility] for 2 weeks. The patient is very well known to this institution and to the staff here. He told the staff at the group home that he was going to cut his wrist and he was brought on IBC by the [employer].  CHIEF COMPLAINT: "I thought I was going to hurt the head man and landlord."  HISTORY OF PRESENT ILLNESS: The patient reports that he asked for his medication at 3 p.m. and was told by the landlord that the medication was due at 5 p.m. The patient reports that he was admitted to [facility] and at the time of discharge, he was told to take the medicine at 8 a.m., 3 p.m. and 8 p.m. and at 3 p.m. when he went and asked for medicine, they told him that it was due at 5 p.m. and he got upset and cursed the landlord out, for which he was brought here.  PAST PSYCHIATRIC HISTORY: He has a long history of mental illness and had multiple inpatient hospitalizations in psychiatry.  ALCOHOL AND DRUGS: Denied.  MENTAL STATUS: The patient is alert and oriented to place, person and time, fully aware of situation that brought him here. Affect is flat, mood depressed. He feels upset about the situation at the group home and does not want to go back there. Admits that he hears voices telling him to hurt people and telling him to kill himself, but currently he reports that the voices tell him to join those bad people and evil people. He is thinking about joining the evil spirits and evil people. He does admit to seeing things and seeing evil people. He knew day,  date, and time, and he knew the reason he was here. Denies any ideas or plans to hurt himself or others and currently contracts for safety. At this time, insight and judgment guarded, impulse control is poor.  IMPRESSION: Schizoaffective disorder with psychosis. Recommend observation and then Nicholas Cole Cole is to evaluate the patient on Monday, 11/25/2013, and call back the group home if they will take him back, or if not, consider placement at a different group home. Continue the current medications.    ____________________________ Wallace Cullens. Franchot Mimes, MD skc:lm D: 11/23/2013 18:59:42 ET T: 11/24/2013 00:29:58 ET JOB#: [id number]  cc: Arlyn Leak K. Franchot Mimes, MD, <Dictator> Dewain Penning MD ELECTRONICALLY SIGNED 11/24/2013 17:39

## 2014-09-20 NOTE — Consult Note (Signed)
PATIENT NAME:  Nicholas Cole, Nicholas Cole MR#:  [id number] DATE OF BIRTH:  March 20, 1966  DATE OF CONSULTATION:  11/15/2013  CONSULTING PHYSICIAN:  Gonzella Lex, MD  IDENTIFYING INFORMATION AND REASON FOR CONSULTATION: A 49 year old gentleman with a history of schizophrenia, brought into the Emergency Room because of worsening psychosis and agitated behavior.  The patient's chief complaint, "Can you send me back to [facility]?"   HISTORY OF PRESENT ILLNESS:  Information obtained from the patient and the chart. This patient who is well known to our service has a history of schizophrenia. He was brought into the hospital after becoming agitated at his family care home. He was claiming that people stealing his money and giving it away to other people. He was getting aggressive and verbally hostile, worsening paranoid delusions. The patient tells me today that the group home was not giving him his medicine correctly. He admits that he has been getting paranoid and has been hearing things. He is requesting referral back to [facility]. He was just released from [facility] about a month ago to live in this current family care home. Denies that he has been abusing substances.   PAST PSYCHIATRIC HISTORY:  Long history of schizophrenia. Unfortunately, he also has a history of aggression, hostility and violence when he is paranoid. Distant history of substance abuse but proudly tells me that he has been clean for 17 years. He also has a history of self-mutilation and suicide attempts.   PAST MEDICAL HISTORY:  High blood pressure, otherwise largely unremarkable.   SOCIAL HISTORY:  Lives in a family care home recently. He does have family that he stays in touch with occasionally, according to him. I am not sure right now whether or not he has a guardian.   FAMILY HISTORY:  None identified.   CURRENT MEDICATIONS:  According to the referral, he is on hydroxyzine 3 times a day, aspirin 81 mg a day,  Abilify 30 mg a day, omeprazole 20 mg twice a day, Ativan p.r.n., lithium 300 mg twice a day, lisinopril 2.5 mg a day and Klonopin 1 mg twice a day. The patient told me he was on clozapine, but I do not see that listed here.   ALLERGIES:  ALL DAIRY PRODUCTS.   REVIEW OF SYSTEMS:  States he has been paranoid, been hearing voices, been feeling angry, been having suicidal and homicidal ideation. He is complaining of dry skin, but otherwise medical review of systems is negative.   MENTAL STATUS EXAMINATION:  Neatly groomed gentleman who looks his stated age. With me, he was calm and pleasant in the interview. Made good eye contact. Psychomotor activity was restrained and appropriate. Affect blunted. Mood stated as being okay. Thoughts are slow with some thought blocking. Did not make any obviously bizarre statements, but says that he has been feeling paranoid and having hallucinations. Denies any actual homicidal intent, but says he has been having some thoughts and feelings about going off on people. Had some suicidal ideation and claims that he cut himself on the arm, although I do not see a fresh cut, so it has not been that recently. Alert and oriented x 4. Short and long-term memory intact. Normal intelligence.   LABORATORY RESULTS:  Drug screen negative. Thyroid screen negative. Salicylates just slightly elevated. CBC normal. Alcohol level negative. Chemistry panel unremarkable.   ASSESSMENT:  A 49 year old man with schizophrenia, who has had a great deal of trouble functioning outside a contained environment. He is only been the state  hospital for about a month and already he has fallen apart and gotten paranoid and agitated, so that he is not manageable. Now, he is back here requesting return to the state hospital. In his case, there is no substance abuse involved and he has probably been compliant with the medicines for the most part. His history of violence and aggression and unpredictable behavior  makes him inappropriate for admission to our unit, as does his history of requiring long-term management.   TREATMENT PLAN:  Restart his medications as I have them here. P.r.n. medications and treatment as appropriate. Referral to [facility].   DIAGNOSIS, PRINCIPAL AND PRIMARY:  AXIS I:  Schizophrenia, paranoid type.   SECONDARY DIAGNOSES: AXIS I:  No further.  AXIS II:  No diagnosis.  AXIS III:  High blood pressure.  AXIS IV:  Severe from chronic illness and lack of a stable place to live.  AXIS V:  Functioning at time of evaluation 36.    ____________________________ Gonzella Lex, MD jtc:dmm D: 11/15/2013 22:06:27 ET T: 11/15/2013 22:13:39 ET JOB#: [id number]  cc: Gonzella Lex, MD, <Dictator> Gonzella Lex MD ELECTRONICALLY SIGNED 11/19/2013 17:11

## 2014-09-20 NOTE — Consult Note (Signed)
Brief Consult Note: Diagnosis: Schizophrenia, PT.   Patient was seen by consultant.   Recommend further assessment or treatment.   Orders entered.   Comments: Pt seen in ED BHU. He continues to have behavioral problems and was readmitted again as he lost temper at the group home. During my interview, he stated that he has problems with anger. He stated that he has to take medications at the same time and was upset about the same.  He was noted to be walking back and forth in the unit. He refused to return to the same group home and stated that he wants to go somewhere else. Stated that he does not get along well with the staff there.  He denied any perceptual disturbances. No behavioral problems noted at this time. Has h/o violence in the past.   Plan: Will titrate the Lithium Carbonate 300mg  po TID His Li level is low at this time.  Will look for alternate placement as he is refusing to be discharged.  He will continue other meds as prescribed.  Will continue to monitor.  Electronic Signatures: Jeronimo Norma (MD)  (Signed 30-Jun-15 10:46)  Authored: Brief Consult Note   Last Updated: 30-Jun-15 10:46 by Jeronimo Norma (MD)

## 2014-09-20 NOTE — Consult Note (Signed)
Brief Consult Note: Diagnosis: schizophrenia.   Patient was seen by consultant.   Recommend further assessment or treatment.   Comments: Patient seen and chart reviewed. Patient best served by referral to [facility]. Meds restarted.  Electronic Signatures: Gonzella Lex (MD)  (Signed 19-Jun-15 22:00)  Authored: Brief Consult Note   Last Updated: 19-Jun-15 22:00 by Gonzella Lex (MD)

## 2014-09-20 NOTE — Consult Note (Signed)
PATIENT NAME:  Nicholas Cole, GOINGS MR#:  [id number] DATE OF BIRTH:  17-Mar-1966  DATE OF CONSULTATION:  02/06/2014  CONSULTING PHYSICIAN:  Gonzella Lex, MD  IDENTIFYING INFORMATION AND REASON FOR CONSULTATION: A 49 year old man with a long history of schizophrenia, brought in by his group home because of agitated behavior.   CHIEF COMPLAINT: "I called the crisis line."   HISTORY OF PRESENT ILLNESS: The full picture is still emerging, but apparently he has decompensated over the last couple of days. Started calling the crisis line from his group home. Said that he was having paranoid thinking and depression. Says he was having thoughts about hurting himself, also feeling aggressive. He has been having auditory hallucinations. The only thing he can come up with as a cause is that he says there have been problems with his medicine. He tells me that his clozapine was discontinued and he cannot tell me why. He also suspects that the people at the group home been giving him the wrong medicine. He says he has been having conflicts, feeling threatened and also hostile. Not abusing substances. He says he has been compliant with what he has been given.   PAST PSYCHIATRIC HISTORY: Long history of schizophrenia. When he decompensates he gets very agitated and bizarre in his behavior. In the past he has responded well to clozapine, also possibly at times to other medications. His agitated spells can be pretty dramatic with aggressive behavior. Probably has a history of self injury in the past. Certainly has a history of violent aggression.   SUBSTANCE ABUSE HISTORY: Mr. Kampfer generally does not use alcohol or drugs. Does not have a significant substance abuse history.   SOCIAL HISTORY: Currently residing in a group home. This is relatively new, he has just been there for a couple of months. Has had long stays in [facility] and been institutionalized for extended periods of time. He is currently  followed by an ACT team.   PAST MEDICAL HISTORY: Gastric reflux symptoms. Mild high blood pressure. Otherwise, in pretty good health.   FAMILY HISTORY: None identified.   CURRENT MEDICATIONS: Trazodone 150 mg at night, pantoprazole 40 mg a day, omega 3 fatty acids 1 twice a day, lithium 300 mg 3 times a day, Zestril 2.5 mg a day, Atarax 50 mg q. 8 hours p.r.n., aspirin 81 mg a day, Abilify 30 mg a day.   ALLERGIES: ALL DAIRY PRODUCTS.   REVIEW OF SYSTEMS: Says he still hearing voices. Feeling agitated and angry. Feeling paranoid. Does not have any specific physical complaints. The whole physical review of systems is essentially negative.   MENTAL STATUS EXAMINATION: A somewhat disheveled, oddly dressed man who looks his stated age. By the time I saw him this afternoon he was cooperative with treatment, although earlier today he was taking his clothes off, relieving himself on the bed in his room, and behaving otherwise in a very bizarre manner. When I came to see him today he made good eye contact. Psychomotor activity was slow. Speech decreased in total amount. Affect flat. Mood stated as being okay. Thoughts slow, blocked, halting. Mood stated as being as I said okay. Denies suicidal or homicidal ideation. Says he is still having hallucinations. Denies any specific intention to do anything violent. He is alert and oriented x 4. Can remember 3 out of 3 objects immediately, 1 out of 3 at 3 minutes. Normal intelligence.   LABORATORY RESULTS: Urinalysis normal. Drug screen positive only for prescribed benzodiazepines. Lithium level 0.9. His  alcohol level was negative. Chemistry panel unremarkable. CBC unremarkable.   VITAL SIGNS: Blood pressure 154/97 currently, respirations 18, pulse 54, temperature 98.4.   ASSESSMENT: A 49 year old man with schizophrenia. Decompensated. Agitated dangerous behavior. Has a history of sexual aggression as well as physical violence that makes him inappropriate for  admission to our hospital. We can consider putting him on the referral to [facility], but in the meantime we will try and treat him to get him better here and see if we can come up with an alternative. I am going to restart his clozapine which in the past had been well tolerated. I am not sure why they stopped it. We can try and find out if there was any clear medical reason for it, but as I said in the past his white count had been quite stable with no significant side effects. Start him back on 50 mg in the morning and 100 mg at night. Daily monitoring of symptoms and behavior.   DIAGNOSIS PRINCIPAL AND PRIMARY:   AXIS I: Schizophrenia, undifferentiated.   SECONDARY DIAGNOSES:  AXIS I: Deferred.   AXIS II: Deferred.    ____________________________ Gonzella Lex, MD jtc:bu D: 02/06/2014 17:13:40 ET T: 02/06/2014 18:37:57 ET JOB#: [id number]  cc: Gonzella Lex, MD, <Dictator> Gonzella Lex MD ELECTRONICALLY SIGNED 02/08/2014 22:25

## 2014-09-20 NOTE — Consult Note (Signed)
Psychiatry: Follow-up note for this 49 year old gentleman with schizoaffective disorder.  He has no new complaints today.  He is hoping to be discharged if appropriate arrangements can be made. denies any current auditory or visual hallucinations denies suicidal or homicidal ideation.  No new physical complaints. exam today the patient is neatly groomed.  He has been behaving himself well.  Minimal outbursts at this point.  No threatening behavior.  Denies suicidal or homicidal ideation. he is still on the waiting list for [facility] there is a good chance that we may be able to discharge him tomorrow.  We won't know for sure until it actually happens.  Patient is looking forward to it.  He understands that it still is not 100%.  Continue current medicines including the new clozapine which she seems to be tolerating well.  Electronic Signatures: Gonzella Lex (MD)  (Signed on 08-Jul-15 20:39)  Authored  Last Updated: 08-Jul-15 20:39 by Gonzella Lex (MD)

## 2014-09-20 NOTE — Consult Note (Signed)
Psychiatry: Follow up for the gentleman with schizophrenia. PAtient is currerntly calm and now states he agrees to go back to his group home. Agrees to continue medication. Will follow up with [facility] team and live back at [facility]. PAtient calm and denies any SI or HI. Discontinued the IVC. Discussed with ER MD.  Electronic Signatures: Gonzella Lex (MD)  (Signed on 24-Jun-15 15:39)  Authored  Last Updated: 24-Jun-15 15:39 by Gonzella Lex (MD)

## 2014-09-20 NOTE — Consult Note (Signed)
Psychiatry: Follow-up for this gentleman with schizophrenia.  He has a history of violence and hostile behavior.  Today he has no new complaints.  He tells me once again that his goal is to go to [facility] and asks me if I think that it will happen within the next week.  He is not reporting any physical complaints.  Denies suicidal ideation but continues to report hallucinations. is flat and blunted.  Mood is stated as fine.  Thoughts are simplistic slow with thought blocking.  Denies suicidal or homicidal ideation but endorses hallucinations.  Takes and tolerates medicine. appropriate for admission to our unit because of his history of impulsive violence and his need for longer-term treatment.  Continue await [facility].  Electronic Signatures: Clapacs, Madie Reno (MD)  (Signed on 20-Jun-15 20:54)  Authored  Last Updated: 20-Jun-15 20:54 by Gonzella Lex (MD)

## 2014-09-20 NOTE — Consult Note (Signed)
Follow-up for this patient with schizoaffective disorder.  He reports that he has been feeling sick.  He vomited 2 times last night.  He denies any other specific symptoms.  Denies fever or chills, pain, shortness of breath.  Says that now he is no longer feeling sick.  He has no idea what might have made him throw up.  Psychiatrically he has no new complaints.  Denies any suicidal or homicidal ideation.  He says that his voices are minimal.  On exam he is neatly groomed and cooperative.  Good eye contact.  Calm behavior and psychomotor activity which is appropriate.  Appropriate speech and tone.  Affect euthymic.  Denies suicidal or homicidal ideation.  Not displaying paranoia or agitation. signs are all stable.  No indication for new labs. may be starting to stabilize.  Reviewed his medicines.  Reviewed his vital signs.  No indication to change immediate medical treatment.  Encouraged him to notify nurses if he is feeling worse.  So far he is generally cooperative with medication.  I am optimistic that he may be returning towards his baseline to allow for discharge within the next 1-2 days.  Patient is still not appropriate for admission to the inpatient psychiatry unit because of his unpredictability of behavior.  Case discussed with emergency room attending.  Diagnosis schizoaffective disorder bipolar type manic.ime spent 15 minutes  Electronic Signatures: [place], Madie Reno (MD)  (Signed on 13-Sep-15 15:36)  Authored  Last Updated: 13-Sep-15 15:36 by Gonzella Lex (MD)

## 2014-09-20 NOTE — Consult Note (Signed)
Psychiatry: PAtient seen and case reviewed. PAtient with schizoaffective disorder who had stopped his medication and decompensated. He has no new complaints today and is asking, as usual , to either go back to the group home or to [facility]. His periodic episodes of violence make him unmanageable on the [facility] inpatient unit. neatly groomed. Good eye contact, affect flat. Speech quiet. While I saw him he was lucidand not acting paranoid. Earlier in the morning he had been more agitated. change to current meds. Continue the 200mg  clozapine. Supportive and cognitive therapy. I hope we will return to a state to allow him to go back to a group home as transfer to [facility] could take weeks.  Electronic Signatures: Clapacs, Madie Reno (MD)  (Signed on 12-Sep-15 21:57)  Authored  Last Updated: 12-Sep-15 21:57 by Gonzella Lex (MD)

## 2014-09-20 NOTE — Consult Note (Signed)
Psychiatry: PAtient seen and chart reviewed. This morning he was cal and lucid and cooperative. This afternoon he has suddenly decompensated for no clear reason. Has been agitated and hostile. Clothes off, poundiing on doors, threatening. Clearly not able to admit to [facility] or send out. Increased cloziril to 100mg  bid. Prns availible.  Electronic Signatures: Clapacs, Madie Reno (MD)  (Signed on 11-Sep-15 16:34)  Authored  Last Updated: 11-Sep-15 16:34 by Gonzella Lex (MD)

## 2014-10-22 ENCOUNTER — Telehealth: Payer: Self-pay

## 2014-10-29 NOTE — Telephone Encounter (Signed)
ERROR

## 2014-11-04 DIAGNOSIS — F200 Paranoid schizophrenia: Secondary | ICD-10-CM | POA: Diagnosis not present

## 2014-11-13 ENCOUNTER — Telehealth: Payer: Self-pay

## 2014-11-13 NOTE — Telephone Encounter (Signed)
Tried contacting pt. Was told Nicholas Cole was assisting in scheduling pts appt. Tried calling her at [phone]. No answer.

## 2014-11-13 NOTE — Telephone Encounter (Signed)
-----   Message from Otilio Jefferson sent at 11/03/2014 10:47 AM EDT ----- Regarding: Patient requesting call from nursing services From: Carlota Raspberry Sent: 10/22/2014  Colon triage

## 2014-11-17 NOTE — Telephone Encounter (Signed)
Mailed letter to pt for call back to schedule colonoscopy.

## 2014-12-04 DIAGNOSIS — Z79899 Other long term (current) drug therapy: Secondary | ICD-10-CM | POA: Diagnosis not present

## 2015-01-01 DIAGNOSIS — Z79899 Other long term (current) drug therapy: Secondary | ICD-10-CM | POA: Diagnosis not present

## 2015-01-29 DIAGNOSIS — Z79899 Other long term (current) drug therapy: Secondary | ICD-10-CM | POA: Diagnosis not present

## 2015-02-13 DIAGNOSIS — F17290 Nicotine dependence, other tobacco product, uncomplicated: Secondary | ICD-10-CM | POA: Diagnosis not present

## 2015-02-13 DIAGNOSIS — I10 Essential (primary) hypertension: Secondary | ICD-10-CM | POA: Diagnosis not present

## 2015-02-13 DIAGNOSIS — F203 Undifferentiated schizophrenia: Secondary | ICD-10-CM | POA: Diagnosis not present

## 2015-02-13 DIAGNOSIS — B351 Tinea unguium: Secondary | ICD-10-CM | POA: Diagnosis not present

## 2015-02-18 DIAGNOSIS — M79675 Pain in left toe(s): Secondary | ICD-10-CM | POA: Diagnosis not present

## 2015-02-18 DIAGNOSIS — R234 Changes in skin texture: Secondary | ICD-10-CM | POA: Diagnosis not present

## 2015-02-18 DIAGNOSIS — M79674 Pain in right toe(s): Secondary | ICD-10-CM | POA: Diagnosis not present

## 2015-02-18 DIAGNOSIS — B351 Tinea unguium: Secondary | ICD-10-CM | POA: Diagnosis not present

## 2015-03-06 DIAGNOSIS — Z79899 Other long term (current) drug therapy: Secondary | ICD-10-CM | POA: Diagnosis not present

## 2015-03-06 DIAGNOSIS — E507 Other ocular manifestations of vitamin A deficiency: Secondary | ICD-10-CM | POA: Diagnosis not present

## 2015-04-03 DIAGNOSIS — Z79899 Other long term (current) drug therapy: Secondary | ICD-10-CM | POA: Diagnosis not present

## 2015-04-07 DIAGNOSIS — Z79899 Other long term (current) drug therapy: Secondary | ICD-10-CM | POA: Diagnosis not present

## 2015-05-04 DIAGNOSIS — Z79899 Other long term (current) drug therapy: Secondary | ICD-10-CM | POA: Diagnosis not present

## 2015-06-05 DIAGNOSIS — F200 Paranoid schizophrenia: Secondary | ICD-10-CM | POA: Diagnosis not present

## 2015-06-15 DIAGNOSIS — M79675 Pain in left toe(s): Secondary | ICD-10-CM | POA: Diagnosis not present

## 2015-06-15 DIAGNOSIS — M79674 Pain in right toe(s): Secondary | ICD-10-CM | POA: Diagnosis not present

## 2015-06-15 DIAGNOSIS — R234 Changes in skin texture: Secondary | ICD-10-CM | POA: Diagnosis not present

## 2015-06-15 DIAGNOSIS — B351 Tinea unguium: Secondary | ICD-10-CM | POA: Diagnosis not present

## 2015-06-15 DIAGNOSIS — M898X9 Other specified disorders of bone, unspecified site: Secondary | ICD-10-CM | POA: Diagnosis not present

## 2015-07-02 DIAGNOSIS — F200 Paranoid schizophrenia: Secondary | ICD-10-CM | POA: Diagnosis not present

## 2015-08-04 DIAGNOSIS — Z79899 Other long term (current) drug therapy: Secondary | ICD-10-CM | POA: Diagnosis not present

## 2015-08-13 DIAGNOSIS — I10 Essential (primary) hypertension: Secondary | ICD-10-CM | POA: Diagnosis not present

## 2015-08-13 DIAGNOSIS — F17210 Nicotine dependence, cigarettes, uncomplicated: Secondary | ICD-10-CM | POA: Diagnosis not present

## 2015-08-13 DIAGNOSIS — G4733 Obstructive sleep apnea (adult) (pediatric): Secondary | ICD-10-CM | POA: Diagnosis not present

## 2015-08-13 DIAGNOSIS — K219 Gastro-esophageal reflux disease without esophagitis: Secondary | ICD-10-CM | POA: Diagnosis not present

## 2015-09-01 DIAGNOSIS — Z79899 Other long term (current) drug therapy: Secondary | ICD-10-CM | POA: Diagnosis not present

## 2015-09-07 DIAGNOSIS — Z79899 Other long term (current) drug therapy: Secondary | ICD-10-CM | POA: Diagnosis not present

## 2015-09-25 DIAGNOSIS — M898X9 Other specified disorders of bone, unspecified site: Secondary | ICD-10-CM | POA: Diagnosis not present

## 2015-09-25 DIAGNOSIS — M79675 Pain in left toe(s): Secondary | ICD-10-CM | POA: Diagnosis not present

## 2015-09-25 DIAGNOSIS — R234 Changes in skin texture: Secondary | ICD-10-CM | POA: Diagnosis not present

## 2015-09-25 DIAGNOSIS — M79674 Pain in right toe(s): Secondary | ICD-10-CM | POA: Diagnosis not present

## 2015-09-25 DIAGNOSIS — B351 Tinea unguium: Secondary | ICD-10-CM | POA: Diagnosis not present

## 2015-09-30 DIAGNOSIS — Z79899 Other long term (current) drug therapy: Secondary | ICD-10-CM | POA: Diagnosis not present

## 2015-11-02 DIAGNOSIS — Z79899 Other long term (current) drug therapy: Secondary | ICD-10-CM | POA: Diagnosis not present

## 2015-11-17 DIAGNOSIS — K219 Gastro-esophageal reflux disease without esophagitis: Secondary | ICD-10-CM | POA: Diagnosis not present

## 2015-11-17 DIAGNOSIS — Z0001 Encounter for general adult medical examination with abnormal findings: Secondary | ICD-10-CM | POA: Diagnosis not present

## 2015-11-17 DIAGNOSIS — Z0000 Encounter for general adult medical examination without abnormal findings: Secondary | ICD-10-CM | POA: Diagnosis not present

## 2015-11-17 DIAGNOSIS — I10 Essential (primary) hypertension: Secondary | ICD-10-CM | POA: Diagnosis not present

## 2015-11-17 DIAGNOSIS — F203 Undifferentiated schizophrenia: Secondary | ICD-10-CM | POA: Diagnosis not present

## 2015-11-17 DIAGNOSIS — Z79899 Other long term (current) drug therapy: Secondary | ICD-10-CM | POA: Diagnosis not present

## 2015-11-17 DIAGNOSIS — F17210 Nicotine dependence, cigarettes, uncomplicated: Secondary | ICD-10-CM | POA: Diagnosis not present

## 2015-12-02 DIAGNOSIS — Z125 Encounter for screening for malignant neoplasm of prostate: Secondary | ICD-10-CM | POA: Diagnosis not present

## 2015-12-02 DIAGNOSIS — I10 Essential (primary) hypertension: Secondary | ICD-10-CM | POA: Diagnosis not present

## 2015-12-02 DIAGNOSIS — Z0001 Encounter for general adult medical examination with abnormal findings: Secondary | ICD-10-CM | POA: Diagnosis not present

## 2015-12-25 DIAGNOSIS — M79674 Pain in right toe(s): Secondary | ICD-10-CM | POA: Diagnosis not present

## 2015-12-25 DIAGNOSIS — B351 Tinea unguium: Secondary | ICD-10-CM | POA: Diagnosis not present

## 2015-12-25 DIAGNOSIS — M79675 Pain in left toe(s): Secondary | ICD-10-CM | POA: Diagnosis not present

## 2015-12-25 DIAGNOSIS — M898X9 Other specified disorders of bone, unspecified site: Secondary | ICD-10-CM | POA: Diagnosis not present

## 2015-12-25 DIAGNOSIS — R234 Changes in skin texture: Secondary | ICD-10-CM | POA: Diagnosis not present

## 2015-12-31 DIAGNOSIS — D649 Anemia, unspecified: Secondary | ICD-10-CM | POA: Diagnosis not present

## 2015-12-31 DIAGNOSIS — N189 Chronic kidney disease, unspecified: Secondary | ICD-10-CM | POA: Diagnosis not present

## 2016-02-02 DIAGNOSIS — Z79899 Other long term (current) drug therapy: Secondary | ICD-10-CM | POA: Diagnosis not present

## 2016-02-03 DIAGNOSIS — R944 Abnormal results of kidney function studies: Secondary | ICD-10-CM | POA: Diagnosis not present

## 2016-02-16 DIAGNOSIS — N390 Urinary tract infection, site not specified: Secondary | ICD-10-CM | POA: Diagnosis not present

## 2016-02-16 DIAGNOSIS — I10 Essential (primary) hypertension: Secondary | ICD-10-CM | POA: Diagnosis not present

## 2016-02-16 DIAGNOSIS — R944 Abnormal results of kidney function studies: Secondary | ICD-10-CM | POA: Diagnosis not present

## 2016-02-16 DIAGNOSIS — N281 Cyst of kidney, acquired: Secondary | ICD-10-CM | POA: Diagnosis not present

## 2016-02-17 DIAGNOSIS — N182 Chronic kidney disease, stage 2 (mild): Secondary | ICD-10-CM | POA: Diagnosis not present

## 2016-02-24 DIAGNOSIS — I10 Essential (primary) hypertension: Secondary | ICD-10-CM | POA: Diagnosis not present

## 2016-02-24 DIAGNOSIS — N183 Chronic kidney disease, stage 3 (moderate): Secondary | ICD-10-CM | POA: Diagnosis not present

## 2016-03-01 DIAGNOSIS — Z79899 Other long term (current) drug therapy: Secondary | ICD-10-CM | POA: Diagnosis not present

## 2016-03-24 DIAGNOSIS — R809 Proteinuria, unspecified: Secondary | ICD-10-CM | POA: Diagnosis not present

## 2016-03-24 DIAGNOSIS — I10 Essential (primary) hypertension: Secondary | ICD-10-CM | POA: Diagnosis not present

## 2016-03-24 DIAGNOSIS — N183 Chronic kidney disease, stage 3 (moderate): Secondary | ICD-10-CM | POA: Diagnosis not present

## 2016-03-25 DIAGNOSIS — Z23 Encounter for immunization: Secondary | ICD-10-CM | POA: Diagnosis not present

## 2016-03-25 DIAGNOSIS — I10 Essential (primary) hypertension: Secondary | ICD-10-CM | POA: Diagnosis not present

## 2016-03-25 DIAGNOSIS — N183 Chronic kidney disease, stage 3 (moderate): Secondary | ICD-10-CM | POA: Diagnosis not present

## 2016-03-31 DIAGNOSIS — Z79899 Other long term (current) drug therapy: Secondary | ICD-10-CM | POA: Diagnosis not present

## 2016-04-08 DIAGNOSIS — M898X9 Other specified disorders of bone, unspecified site: Secondary | ICD-10-CM | POA: Diagnosis not present

## 2016-04-08 DIAGNOSIS — R234 Changes in skin texture: Secondary | ICD-10-CM | POA: Diagnosis not present

## 2016-04-08 DIAGNOSIS — B351 Tinea unguium: Secondary | ICD-10-CM | POA: Diagnosis not present

## 2016-04-08 DIAGNOSIS — M79674 Pain in right toe(s): Secondary | ICD-10-CM | POA: Diagnosis not present

## 2016-04-08 DIAGNOSIS — M7752 Other enthesopathy of left foot: Secondary | ICD-10-CM | POA: Diagnosis not present

## 2016-05-02 DIAGNOSIS — Z79899 Other long term (current) drug therapy: Secondary | ICD-10-CM | POA: Diagnosis not present

## 2016-05-31 DIAGNOSIS — Z79899 Other long term (current) drug therapy: Secondary | ICD-10-CM | POA: Diagnosis not present

## 2016-06-28 DIAGNOSIS — I10 Essential (primary) hypertension: Secondary | ICD-10-CM | POA: Diagnosis not present

## 2016-06-28 DIAGNOSIS — R631 Polydipsia: Secondary | ICD-10-CM | POA: Diagnosis not present

## 2016-06-28 DIAGNOSIS — N183 Chronic kidney disease, stage 3 (moderate): Secondary | ICD-10-CM | POA: Diagnosis not present

## 2016-07-01 DIAGNOSIS — Z79899 Other long term (current) drug therapy: Secondary | ICD-10-CM | POA: Diagnosis not present

## 2016-07-15 DIAGNOSIS — R234 Changes in skin texture: Secondary | ICD-10-CM | POA: Diagnosis not present

## 2016-07-15 DIAGNOSIS — M898X9 Other specified disorders of bone, unspecified site: Secondary | ICD-10-CM | POA: Diagnosis not present

## 2016-07-15 DIAGNOSIS — Q6689 Other  specified congenital deformities of feet: Secondary | ICD-10-CM | POA: Diagnosis not present

## 2016-07-15 DIAGNOSIS — B351 Tinea unguium: Secondary | ICD-10-CM | POA: Diagnosis not present

## 2016-07-15 DIAGNOSIS — M7752 Other enthesopathy of left foot: Secondary | ICD-10-CM | POA: Diagnosis not present

## 2016-07-21 DIAGNOSIS — N183 Chronic kidney disease, stage 3 (moderate): Secondary | ICD-10-CM | POA: Diagnosis not present

## 2016-07-21 DIAGNOSIS — G4733 Obstructive sleep apnea (adult) (pediatric): Secondary | ICD-10-CM | POA: Diagnosis not present

## 2016-07-21 DIAGNOSIS — I10 Essential (primary) hypertension: Secondary | ICD-10-CM | POA: Diagnosis not present

## 2016-07-29 DIAGNOSIS — Z79899 Other long term (current) drug therapy: Secondary | ICD-10-CM | POA: Diagnosis not present

## 2016-09-06 DIAGNOSIS — Z79899 Other long term (current) drug therapy: Secondary | ICD-10-CM | POA: Diagnosis not present

## 2016-10-18 DIAGNOSIS — R631 Polydipsia: Secondary | ICD-10-CM | POA: Diagnosis not present

## 2016-10-18 DIAGNOSIS — I10 Essential (primary) hypertension: Secondary | ICD-10-CM | POA: Diagnosis not present

## 2016-10-18 DIAGNOSIS — N183 Chronic kidney disease, stage 3 (moderate): Secondary | ICD-10-CM | POA: Diagnosis not present

## 2016-10-18 DIAGNOSIS — R809 Proteinuria, unspecified: Secondary | ICD-10-CM | POA: Diagnosis not present

## 2016-11-02 DIAGNOSIS — B351 Tinea unguium: Secondary | ICD-10-CM | POA: Diagnosis not present

## 2016-11-02 DIAGNOSIS — M79674 Pain in right toe(s): Secondary | ICD-10-CM | POA: Diagnosis not present

## 2016-11-02 DIAGNOSIS — M79675 Pain in left toe(s): Secondary | ICD-10-CM | POA: Diagnosis not present

## 2016-11-05 ENCOUNTER — Encounter: Payer: Self-pay | Admitting: Internal Medicine

## 2016-11-05 ENCOUNTER — Emergency Department: Payer: Medicare Other

## 2016-11-05 ENCOUNTER — Inpatient Hospital Stay
Admission: EM | Admit: 2016-11-05 | Discharge: 2016-11-11 | DRG: 674 | Disposition: A | Discharge status: Home / Self Care | Payer: Medicare Other | Attending: Internal Medicine | Admitting: Internal Medicine

## 2016-11-05 DIAGNOSIS — E876 Hypokalemia: Secondary | ICD-10-CM | POA: Diagnosis present

## 2016-11-05 DIAGNOSIS — F250 Schizoaffective disorder, bipolar type: Secondary | ICD-10-CM | POA: Diagnosis not present

## 2016-11-05 DIAGNOSIS — I959 Hypotension, unspecified: Secondary | ICD-10-CM | POA: Diagnosis not present

## 2016-11-05 DIAGNOSIS — R4182 Altered mental status, unspecified: Secondary | ICD-10-CM | POA: Diagnosis not present

## 2016-11-05 DIAGNOSIS — W19XXXA Unspecified fall, initial encounter: Secondary | ICD-10-CM | POA: Diagnosis present

## 2016-11-05 DIAGNOSIS — Z79899 Other long term (current) drug therapy: Secondary | ICD-10-CM | POA: Diagnosis not present

## 2016-11-05 DIAGNOSIS — R251 Tremor, unspecified: Secondary | ICD-10-CM | POA: Diagnosis present

## 2016-11-05 DIAGNOSIS — Y92009 Unspecified place in unspecified non-institutional (private) residence as the place of occurrence of the external cause: Secondary | ICD-10-CM

## 2016-11-05 DIAGNOSIS — E860 Dehydration: Secondary | ICD-10-CM

## 2016-11-05 DIAGNOSIS — T56891A Toxic effect of other metals, accidental (unintentional), initial encounter: Secondary | ICD-10-CM | POA: Diagnosis not present

## 2016-11-05 DIAGNOSIS — N189 Chronic kidney disease, unspecified: Secondary | ICD-10-CM | POA: Diagnosis not present

## 2016-11-05 DIAGNOSIS — S0990XA Unspecified injury of head, initial encounter: Secondary | ICD-10-CM | POA: Diagnosis not present

## 2016-11-05 DIAGNOSIS — M6282 Rhabdomyolysis: Secondary | ICD-10-CM | POA: Diagnosis present

## 2016-11-05 DIAGNOSIS — E871 Hypo-osmolality and hyponatremia: Secondary | ICD-10-CM | POA: Diagnosis present

## 2016-11-05 DIAGNOSIS — N179 Acute kidney failure, unspecified: Principal | ICD-10-CM | POA: Diagnosis present

## 2016-11-05 DIAGNOSIS — I10 Essential (primary) hypertension: Secondary | ICD-10-CM | POA: Diagnosis present

## 2016-11-05 DIAGNOSIS — T56891D Toxic effect of other metals, accidental (unintentional), subsequent encounter: Secondary | ICD-10-CM | POA: Diagnosis not present

## 2016-11-05 DIAGNOSIS — Z7982 Long term (current) use of aspirin: Secondary | ICD-10-CM

## 2016-11-05 DIAGNOSIS — E872 Acidosis, unspecified: Secondary | ICD-10-CM

## 2016-11-05 DIAGNOSIS — I129 Hypertensive chronic kidney disease with stage 1 through stage 4 chronic kidney disease, or unspecified chronic kidney disease: Secondary | ICD-10-CM | POA: Diagnosis not present

## 2016-11-05 DIAGNOSIS — T50901A Poisoning by unspecified drugs, medicaments and biological substances, accidental (unintentional), initial encounter: Secondary | ICD-10-CM | POA: Diagnosis not present

## 2016-11-05 DIAGNOSIS — T43595A Adverse effect of other antipsychotics and neuroleptics, initial encounter: Secondary | ICD-10-CM | POA: Diagnosis not present

## 2016-11-05 DIAGNOSIS — R4781 Slurred speech: Secondary | ICD-10-CM | POA: Diagnosis not present

## 2016-11-05 LAB — COMPREHENSIVE METABOLIC PANEL
ALBUMIN: 4.8 g/dL (ref 3.5–5.0)
ALT: 13 U/L — ABNORMAL LOW (ref 17–63)
AST: 30 U/L (ref 15–41)
Alkaline Phosphatase: 136 U/L — ABNORMAL HIGH (ref 38–126)
Anion gap: 20 — ABNORMAL HIGH (ref 5–15)
BUN: 78 mg/dL — AB (ref 6–20)
CHLORIDE: 81 mmol/L — AB (ref 101–111)
CO2: 29 mmol/L (ref 22–32)
Calcium: 9 mg/dL (ref 8.9–10.3)
Creatinine, Ser: 11.34 mg/dL — ABNORMAL HIGH (ref 0.61–1.24)
GFR calc Af Amer: 5 mL/min — ABNORMAL LOW (ref >60)
GFR calc non Af Amer: 5 mL/min — ABNORMAL LOW (ref >60)
GLUCOSE: 179 mg/dL — AB (ref 65–99)
POTASSIUM: 3 mmol/L — AB (ref 3.5–5.1)
Sodium: 130 mmol/L — ABNORMAL LOW (ref 135–145)
Total Bilirubin: 1.3 mg/dL — ABNORMAL HIGH (ref 0.3–1.2)
Total Protein: 7.7 g/dL (ref 6.5–8.1)

## 2016-11-05 LAB — CBC WITH DIFFERENTIAL/PLATELET
Basophils Absolute: 0 10*3/uL (ref 0–0.1)
Basophils Relative: 0 %
EOS ABS: 0 10*3/uL (ref 0–0.7)
EOS PCT: 0 %
HCT: 35.7 % — ABNORMAL LOW (ref 40.0–52.0)
Hemoglobin: 12 g/dL — ABNORMAL LOW (ref 13.0–18.0)
LYMPHS ABS: 0.3 10*3/uL — AB (ref 1.0–3.6)
Lymphocytes Relative: 2 %
MCH: 28.7 pg (ref 26.0–34.0)
MCHC: 33.5 g/dL (ref 32.0–36.0)
MCV: 85.6 fL (ref 80.0–100.0)
Monocytes Absolute: 0.6 10*3/uL (ref 0.2–1.0)
Monocytes Relative: 5 %
Neutro Abs: 11.7 10*3/uL — ABNORMAL HIGH (ref 1.4–6.5)
Neutrophils Relative %: 93 %
Platelets: 188 10*3/uL (ref 150–440)
RBC: 4.17 MIL/uL — AB (ref 4.40–5.90)
RDW: 12.5 % (ref 11.5–14.5)
WBC: 12.7 10*3/uL — AB (ref 3.8–10.6)

## 2016-11-05 LAB — BLOOD GAS, VENOUS
Acid-Base Excess: 7.1 mmol/L — ABNORMAL HIGH (ref 0.0–2.0)
BICARBONATE: 33 mmol/L — AB (ref 20.0–28.0)
O2 Saturation: 62.4 %
PH VEN: 7.41 (ref 7.250–7.430)
Patient temperature: 37
pCO2, Ven: 52 mmHg (ref 44.0–60.0)
pO2, Ven: 32 mmHg (ref 32.0–45.0)

## 2016-11-05 LAB — LITHIUM LEVEL: LITHIUM LVL: 2.7 mmol/L — AB (ref 0.60–1.20)

## 2016-11-05 LAB — LACTIC ACID, PLASMA
Lactic Acid, Venous: 2.3 mmol/L (ref 0.5–1.9)
Lactic Acid, Venous: 3 mmol/L (ref 0.5–1.9)

## 2016-11-05 LAB — ACETAMINOPHEN LEVEL: Acetaminophen (Tylenol), Serum: <10 ug/mL — ABNORMAL LOW (ref 10–30)

## 2016-11-05 LAB — ETHANOL

## 2016-11-05 LAB — SALICYLATE LEVEL: Salicylate Lvl: <7 mg/dL (ref 2.8–30.0)

## 2016-11-05 LAB — CK: Total CK: 700 U/L — ABNORMAL HIGH (ref 49–397)

## 2016-11-05 LAB — LIPASE, BLOOD: Lipase: 82 U/L — ABNORMAL HIGH (ref 11–51)

## 2016-11-05 LAB — TROPONIN I

## 2016-11-05 MED ORDER — ACETAMINOPHEN 650 MG RE SUPP: 650.0000 mg | Freq: Four times a day (QID) | RECTAL | Status: DC | PRN

## 2016-11-05 MED ORDER — DOCUSATE SODIUM 100 MG PO CAPS
100.0000 mg | ORAL_CAPSULE | Freq: Two times a day (BID) | ORAL | Status: DC
  Administered 2016-11-05 – 2016-11-11 (×11): 100 mg via ORAL
  Filled 2016-11-05 (×11): qty 1

## 2016-11-05 MED ORDER — POTASSIUM CHLORIDE IN NACL 20-0.9 MEQ/L-% IV SOLN
INTRAVENOUS | Status: DC
  Administered 2016-11-05 – 2016-11-07 (×4): via INTRAVENOUS
  Filled 2016-11-05 (×5): qty 1000

## 2016-11-05 MED ORDER — LITHIUM CARBONATE 300 MG PO CAPS: 300.0000 mg | ORAL_CAPSULE | Freq: Every morning | ORAL | Status: DC

## 2016-11-05 MED ORDER — FAMOTIDINE 20 MG PO TABS
20.0000 mg | ORAL_TABLET | Freq: Every day | ORAL | Status: DC
  Administered 2016-11-06 – 2016-11-11 (×5): 20 mg via ORAL
  Filled 2016-11-05 (×4): qty 1

## 2016-11-05 MED ORDER — HEPARIN SODIUM (PORCINE) 5000 UNIT/ML IJ SOLN
5000.0000 [IU] | Freq: Three times a day (TID) | INTRAMUSCULAR | Status: DC
  Administered 2016-11-05 – 2016-11-11 (×16): 5000 [IU] via SUBCUTANEOUS
  Filled 2016-11-05 (×16): qty 1

## 2016-11-05 MED ORDER — ATENOLOL 25 MG PO TABS: 25.0000 mg | ORAL_TABLET | Freq: Every day | ORAL | Status: DC

## 2016-11-05 MED ORDER — LITHIUM CARBONATE 300 MG PO CAPS
600.0000 mg | ORAL_CAPSULE | Freq: Every day | ORAL | Status: DC
  Filled 2016-11-05: qty 2

## 2016-11-05 MED ORDER — LITHIUM CARBONATE 300 MG PO CAPS: 300.0000 mg | ORAL_CAPSULE | ORAL | Status: DC

## 2016-11-05 MED ORDER — ASPIRIN EC 81 MG PO TBEC
81.0000 mg | DELAYED_RELEASE_TABLET | Freq: Every day | ORAL | Status: DC
  Administered 2016-11-06 – 2016-11-11 (×5): 81 mg via ORAL
  Filled 2016-11-05 (×5): qty 1

## 2016-11-05 MED ORDER — LACTATED RINGERS IV SOLN
INTRAVENOUS | Status: AC
  Administered 2016-11-05: 1000 mL via INTRAVENOUS

## 2016-11-05 MED ORDER — CEFTRIAXONE SODIUM 1 G IJ SOLR
1.0000 g | INTRAMUSCULAR | Status: DC
  Administered 2016-11-05 – 2016-11-06 (×2): 1 g via INTRAVENOUS
  Filled 2016-11-05 (×3): qty 10

## 2016-11-05 MED ORDER — BISACODYL 10 MG RE SUPP: 10.0000 mg | Freq: Every day | RECTAL | Status: DC | PRN

## 2016-11-05 MED ORDER — LORAZEPAM 2 MG/ML IJ SOLN
0.5000 mg | INTRAMUSCULAR | Status: DC | PRN
  Administered 2016-11-09 – 2016-11-10 (×2): 0.5 mg via INTRAVENOUS
  Filled 2016-11-05 (×2): qty 1

## 2016-11-05 MED ORDER — ACETAMINOPHEN 325 MG PO TABS
650.0000 mg | ORAL_TABLET | Freq: Four times a day (QID) | ORAL | Status: DC | PRN
  Administered 2016-11-08: 650 mg via ORAL
  Filled 2016-11-05: qty 2

## 2016-11-05 MED ORDER — ONDANSETRON HCL 4 MG/2ML IJ SOLN: 4.0000 mg | Freq: Four times a day (QID) | INTRAMUSCULAR | Status: DC | PRN

## 2016-11-05 MED ORDER — ONDANSETRON HCL 4 MG PO TABS: 4.0000 mg | ORAL_TABLET | Freq: Four times a day (QID) | ORAL | Status: DC | PRN

## 2016-11-05 MED ORDER — SODIUM CHLORIDE 0.9 % IV BOLUS (SEPSIS)
1000.0000 mL | Freq: Once | INTRAVENOUS | Status: AC
  Administered 2016-11-05: 1000 mL via INTRAVENOUS

## 2016-11-05 NOTE — H&P (Signed)
History and Physical    Nicholas Cole EPP:[id number] DOB: 1966-05-15 DOA: 11/05/2016  Referring physician: Dr. Joni Fears PCP: No primary care provider on file.  Specialists: none  Chief Complaint: weakness  HPI: Nicholas Cole is a 51 y.o. male has a past medical history significant for schizoaffective d/o and HTN brought in by EMS after being found on the floor in his home lethargic. Pt has not been eating or drinking well but cannot articulate how long he was lying on the floor. In ER, pt found to be in ARF with rhabdomyolysis. He is now admitted. Denies N/V/D. Denies CP or SOB. Tremulous in ER and lethargic.  Review of Systems: The patient denies anorexia, fever, weight loss,, vision loss, decreased hearing, hoarseness, chest pain, syncope, dyspnea on exertion, peripheral edema, balance deficits, hemoptysis, abdominal pain, melena, hematochezia, severe indigestion/heartburn, hematuria, incontinence, genital sores,  suspicious skin lesions, transient blindness, depression, unusual weight change, abnormal bleeding, enlarged lymph nodes, angioedema, and breast masses.   History reviewed. No pertinent past medical history. History reviewed. No pertinent surgical history. Social History:  reports that he has quit smoking. He has never used smokeless tobacco. His alcohol and drug histories are not on file.  No Known Allergies  History reviewed. No pertinent family history.  Prior to Admission medications   Medication Sig Start Date End Date Taking? Authorizing Provider  aspirin EC 81 MG tablet Take 81 mg by mouth daily.   Yes [provider]  atenolol (TENORMIN) 25 MG tablet Take 25 mg by mouth daily.   Yes [provider]  cloZAPine (CLOZARIL) 100 MG tablet Take 500 mg by mouth daily.   Yes [provider]  lithium carbonate 300 MG capsule Take 300-600 mg by mouth See admin instructions. tk 1 cap qam and 2 caps qhs   Yes [provider]  perphenazine  (TRILAFON) 2 MG tablet Take 2 mg by mouth at bedtime.   Yes [provider]  ranitidine (ZANTAC) 150 MG tablet Take 150 mg by mouth 2 (two) times daily.   Yes [provider]   Physical Exam: Vitals:   11/05/16 1850 11/05/16 1854 11/05/16 1855  BP:  (!) 98/59   Pulse:  85   Resp:  18   Temp:  98.2 F (36.8 C)   TempSrc:  Oral   SpO2: 97% 96%   Weight:   77.1 kg (170 lb)  Height:   5\' 9"  (1.753 m)     General:  No apparent distress, tremulous, thin, [place]/AT  Eyes: PERRL, EOMI, no scleral icterus, conjunctiva clear  ENT: dry oropharynx without exudate, TM's benign, dentition poor  Neck: supple, no lymphadenopathy. No bruits or thyromegaly  Cardiovascular: regular rate without MRG; 2+ peripheral pulses, no JVD, no peripheral edema  Respiratory: CTA biL, good air movement without wheezing, rhonchi or crackled. Respiratory effort normal  Abdomen: soft, non tender to palpation, positive bowel sounds, no guarding, no rebound  Skin: no rashes or lesions  Musculoskeletal: normal bulk and tone, no joint swelling  Psychiatric: normal mood and affect, A&OX3  Neurologic: CN 2-12 grossly intact, Motor strength 5/5 in all 4 groups with symmetric DTR's and non-focal sensory exam  Labs on Admission:  Basic Metabolic Panel:  Recent Labs Lab 11/05/16 1857  NA 130*  K 3.0*  CL 81*  CO2 29  GLUCOSE 179*  BUN 78*  CREATININE 11.34*  CALCIUM 9.0   Liver Function Tests:  Recent Labs Lab 11/05/16 1857  AST 30  ALT 13*  ALKPHOS 136*  BILITOT 1.3*  PROT 7.7  ALBUMIN 4.8    Recent Labs Lab 11/05/16 1857  LIPASE 82*   No results for input(s): AMMONIA in the last 168 hours. CBC:  Recent Labs Lab 11/05/16 1857  WBC 12.7*  NEUTROABS 11.7*  HGB 12.0*  HCT 35.7*  MCV 85.6  PLT 188   Cardiac Enzymes:  Recent Labs Lab 11/05/16 1857  CKTOTAL 700*  TROPONINI <0.03    BNP (last 3 results) No results for input(s): BNP in the last 8760  hours.  ProBNP (last 3 results) No results for input(s): PROBNP in the last 8760 hours.  CBG: No results for input(s): GLUCAP in the last 168 hours.  Radiological Exams on Admission: Dg Chest Portable 1 View  Result Date: 11/05/2016 CLINICAL DATA:  Patient was found in his apartment with the windows closed and no AC on, incontinent to urine, slurred speech and confusion. Patient has dried mucous membranes. Patient was hypotensive upon EMs arrival at 76/41; EXAM: PORTABLE CHEST 1 VIEW COMPARISON:  12/05/2013 FINDINGS: Numerous leads and wires project over the chest. Midline trachea. Normal heart size and mediastinal contours. No pleural effusion or pneumothorax. Clear lungs. IMPRESSION: No acute cardiopulmonary disease. Electronically Signed   By: Abigail Miyamoto M.D.   On: 11/05/2016 19:29    EKG: Independently reviewed.  Assessment/Plan Principal Problem:   ARF (acute renal failure) (HCC) Active Problems:   Dehydration   Rhabdomyolysis   Schizoaffective disorder ([place])   Will admit to floor and begin IV fluids with empiric IV ABX. UA pending. Consult nephrology and Psychiatry. Consult PT and CM. [place] of kidneys. Repeat labs in AM  Diet: regular Fluids: NS@100  DVT Prophylaxis: SQ Heparin  Code Status: FULL  Family Communication: none  Disposition Plan: home  Time spent: 55 min

## 2016-11-05 NOTE — ED Notes (Signed)
Dr. Doy Hutching paged at this time for lactic acid critical value.

## 2016-11-05 NOTE — ED Notes (Signed)
Patient ate part of the sandwich and states that he will still like to nibble on the rest. Patient c/o needing to sit up on the stretcher. Head of bed was raised and tray placed at side of the bed with railing down for patient's comfort.

## 2016-11-05 NOTE — ED Provider Notes (Signed)
[facility] Emergency Department Provider Note  ____________________________________________  Time seen: Approximately 7:58 PM  I have reviewed the triage vital signs and the nursing notes.   HISTORY  Chief Complaint Altered Mental Status Level 5 caveat:  Portions of the history and physical were unable to be obtained due to: Poor historian    HPI Nicholas Cole is a 51 y.o. male found down in his apartment during a wellness check by EMS, they note that the apartment was hot without air conditioning. Patient reports that he has been very dizzy and fell, denies any headache currently. Does think that he probably hit his head prior to losing consciousness. Denies any chest pain shortness of breath vomiting or belly pain. No diarrhea. EMS noted that he had urinated on himself on the floor.     No past medical history on file. Schizoaffective disorder bipolar type  There are no active problems to display for this patient.    No past surgical history on file. None  Prior to Admission medications   Medication Sig Start Date End Date Taking? Authorizing Provider  aspirin EC 81 MG tablet Take 81 mg by mouth daily.   Yes [provider]  atenolol (TENORMIN) 25 MG tablet Take 25 mg by mouth daily.   Yes [provider]  cloZAPine (CLOZARIL) 100 MG tablet Take 500 mg by mouth daily.   Yes [provider]  lithium carbonate 300 MG capsule Take 300-600 mg by mouth See admin instructions. tk 1 cap qam and 2 caps qhs   Yes [provider]  perphenazine (TRILAFON) 2 MG tablet Take 2 mg by mouth at bedtime.   Yes [provider]  ranitidine (ZANTAC) 150 MG tablet Take 150 mg by mouth 2 (two) times daily.   Yes [provider]     Allergies Patient has no known allergies.   No family history on file.  Social History Social History  Substance Use Topics  . Smoking status: Not on file  . Smokeless tobacco:  Not on file  . Alcohol use Not on file  Denies tobacco alcohol or drug use  Review of Systems  Constitutional:   No fever or chills.  ENT:   No sore throat. No rhinorrhea. Cardiovascular:   No chest pain or syncope. Respiratory:   No dyspnea or cough. Gastrointestinal:   Negative for abdominal pain, vomiting and diarrhea.  Musculoskeletal:   Negative for focal pain or swelling All other systems reviewed and are negative except as documented above in ROS and HPI.  ____________________________________________   PHYSICAL EXAM:  VITAL SIGNS: ED Triage Vitals  Enc Vitals Group     BP 11/05/16 1854 (!) 98/59     Pulse Rate 11/05/16 1854 85     Resp 11/05/16 1854 18     Temp 11/05/16 1854 98.2 F (36.8 C)     Temp Source 11/05/16 1854 Oral     SpO2 11/05/16 1850 97 %     Weight 11/05/16 1855 170 lb (77.1 kg)     Height 11/05/16 1855 5\' 9"  (1.753 m)     Head Circumference --      Peak Flow --      Pain Score --      Pain Loc --      Pain Edu? --      Excl. in [place]? --     Vital signs reviewed, nursing assessments reviewed.   Constitutional:   Alert and oriented. Not in distress. Eyes:  No scleral icterus.  EOMI. No nystagmus. No conjunctival pallor. PERRL. ENT   Head:   Normocephalic and atraumatic.   Nose:   No congestion/rhinnorhea.    Mouth/Throat:   Dry mucous membranes, no pharyngeal erythema. No peritonsillar mass.    Neck:   No meningismus. Full ROM Hematological/Lymphatic/Immunilogical:   No cervical lymphadenopathy. Cardiovascular:   RRR. Symmetric bilateral radial and DP pulses.  No murmurs.  Respiratory:   Normal respiratory effort without tachypnea/retractions. Breath sounds are clear and equal bilaterally. No wheezes/rales/rhonchi. Gastrointestinal:   Soft and nontender. Non distended. There is no CVA tenderness.  No rebound, rigidity, or guarding. Genitourinary:   deferred Musculoskeletal:   Normal range of motion in all extremities. No joint  effusions.  No lower extremity tenderness.  No edema. Neurologic:   Slurred speech. Repetitive questioning.  Motor grossly intact. No gross focal neurologic deficits are appreciated.  Skin:    Skin is warm, dry and intact. No rash noted.  No petechiae, purpura, or bullae.  ____________________________________________    LABS (pertinent positives/negatives) (all labs ordered are listed, but only abnormal results are displayed) Labs Reviewed  COMPREHENSIVE METABOLIC PANEL - Abnormal; Notable for the following:       Result Value   Sodium 130 (*)    Potassium 3.0 (*)    Chloride 81 (*)    Glucose, Bld 179 (*)    BUN 78 (*)    Creatinine, Ser 11.34 (*)    ALT 13 (*)    Alkaline Phosphatase 136 (*)    Total Bilirubin 1.3 (*)    GFR calc non Af Amer 5 (*)    GFR calc Af Amer 5 (*)    Anion gap 20 (*)    All other components within normal limits  ACETAMINOPHEN LEVEL - Abnormal; Notable for the following:    Acetaminophen (Tylenol), Serum <10 (*)    All other components within normal limits  LIPASE, BLOOD - Abnormal; Notable for the following:    Lipase 82 (*)    All other components within normal limits  CBC WITH DIFFERENTIAL/PLATELET - Abnormal; Notable for the following:    WBC 12.7 (*)    RBC 4.17 (*)    Hemoglobin 12.0 (*)    HCT 35.7 (*)    Neutro Abs 11.7 (*)    Lymphs Abs 0.3 (*)    All other components within normal limits  CK - Abnormal; Notable for the following:    Total CK 700 (*)    All other components within normal limits  ETHANOL  TROPONIN I  SALICYLATE LEVEL  LACTIC ACID, PLASMA  LACTIC ACID, PLASMA  BLOOD GAS, VENOUS  VOLATILES,BLD-ACETONE,ETHANOL,ISOPROP,METHANOL   ____________________________________________   EKG  Interpreted by me Sinus rhythm rate of 85, right axis, right bundle branch block. Associated T-wave inversions in anterior leads. Normal ST segments. No acute ischemic changes.  ____________________________________________     [id number]  Dg Chest Portable 1 View  Result Date: 11/05/2016 CLINICAL DATA:  Patient was found in his apartment with the windows closed and no AC on, incontinent to urine, slurred speech and confusion. Patient has dried mucous membranes. Patient was hypotensive upon EMs arrival at 76/41; EXAM: PORTABLE CHEST 1 VIEW COMPARISON:  12/05/2013 FINDINGS: Numerous leads and wires project over the chest. Midline trachea. Normal heart size and mediastinal contours. No pleural effusion or pneumothorax. Clear lungs. IMPRESSION: No acute cardiopulmonary disease. Electronically Signed   By: Abigail Miyamoto M.D.   On: 11/05/2016 19:29   CT head pending  ____________________________________________   PROCEDURES Procedures  ____________________________________________   INITIAL IMPRESSION / ASSESSMENT AND PLAN / ED COURSE  Pertinent labs & imaging results that were available during my care of the patient were reviewed by me and considered in my medical decision making (see chart for details).    Clinical Course as of Nov 05 1956  Sat Nov 05, 2016  1957 Labs consistent with severe dehydration and rhabdomyolysis. Patient has received 1.5 L of normal saline. I'll continue hydration with lactated Ringer's at twice maintenance. I will discuss  with hospitalist for admission. Creatinine: (!) 11.34 [PS]    Clinical Course User Index [PS] Carrie Mew, MD     ____________________________________________   FINAL CLINICAL IMPRESSION(S) / ED DIAGNOSES  Final diagnoses:  Acute renal failure, unspecified acute renal failure type ([place])  Non-traumatic rhabdomyolysis  Metabolic acidosis      New Prescriptions   No medications on file     Portions of this note were generated with dragon dictation software. Dictation errors may occur despite best attempts at proofreading.    Carrie Mew, MD 11/05/16 2006

## 2016-11-05 NOTE — ED Notes (Signed)
Patient given ED sandwich tray and ginger ale per his request and with Dr. Jerene Canny approval. Patient was agitated, stating that he wanted to go to [facility], but calmed down with food.

## 2016-11-05 NOTE — ED Notes (Signed)
Patient found standing unsteadily at the side of the bed. Patient states the bed was uncomfortable. Patient was assisted back to bed by this Probation officer and ED tech. Patient's pants were changed, new gown put on and linens changed. Patient was placed back on monitor. LR infusing without difficulty.

## 2016-11-05 NOTE — ED Notes (Signed)
Dr. Doy Hutching informed of lactic acid of 3.0.

## 2016-11-05 NOTE — ED Notes (Signed)
Pt voided (incontinent). Bed linen and gown was changed by RN Becky Sax and this EDT

## 2016-11-05 NOTE — ED Notes (Signed)
Dr. Joni Fears informed of lactic acid of 2.3.

## 2016-11-05 NOTE — ED Triage Notes (Signed)
Per EMS report, Patient lives alone and a friend couldn't get in touch with him and called for a wellness check. Patient was found in his apartment with the windows closed and no AC on, incontinent to urine, slurred speech and confusion. Patient has dried mucous membranes. Patient was hypotensive upon EMs arrival at 76/41, heart rate in 90's, 97% on room air. Patient was given 522ml of NS enroute.

## 2016-11-06 ENCOUNTER — Encounter: Payer: Self-pay | Admitting: Psychiatry

## 2016-11-06 ENCOUNTER — Inpatient Hospital Stay: Payer: Medicare Other

## 2016-11-06 DIAGNOSIS — F250 Schizoaffective disorder, bipolar type: Secondary | ICD-10-CM

## 2016-11-06 DIAGNOSIS — R4182 Altered mental status, unspecified: Secondary | ICD-10-CM

## 2016-11-06 DIAGNOSIS — T56891A Toxic effect of other metals, accidental (unintentional), initial encounter: Secondary | ICD-10-CM | POA: Diagnosis present

## 2016-11-06 DIAGNOSIS — T50901A Poisoning by unspecified drugs, medicaments and biological substances, accidental (unintentional), initial encounter: Secondary | ICD-10-CM

## 2016-11-06 DIAGNOSIS — I10 Essential (primary) hypertension: Secondary | ICD-10-CM

## 2016-11-06 DIAGNOSIS — N189 Chronic kidney disease, unspecified: Secondary | ICD-10-CM

## 2016-11-06 LAB — BASIC METABOLIC PANEL
ANION GAP: 14 (ref 5–15)
BUN: 77 mg/dL — AB (ref 6–20)
CHLORIDE: 87 mmol/L — AB (ref 101–111)
CO2: 31 mmol/L (ref 22–32)
Calcium: 8.6 mg/dL — ABNORMAL LOW (ref 8.9–10.3)
Creatinine, Ser: 10.16 mg/dL — ABNORMAL HIGH (ref 0.61–1.24)
GFR calc Af Amer: 6 mL/min — ABNORMAL LOW (ref >60)
GFR calc non Af Amer: 5 mL/min — ABNORMAL LOW (ref >60)
GLUCOSE: 141 mg/dL — AB (ref 65–99)
POTASSIUM: 3.1 mmol/L — AB (ref 3.5–5.1)
Sodium: 132 mmol/L — ABNORMAL LOW (ref 135–145)

## 2016-11-06 LAB — URINALYSIS, COMPLETE (UACMP) WITH MICROSCOPIC
BILIRUBIN URINE: NEGATIVE
GLUCOSE, UA: NEGATIVE mg/dL
KETONES UR: NEGATIVE mg/dL
LEUKOCYTES UA: NEGATIVE
NITRITE: NEGATIVE
PH: 5 (ref 5.0–8.0)
PROTEIN: NEGATIVE mg/dL
Specific Gravity, Urine: 1.009 (ref 1.005–1.030)

## 2016-11-06 LAB — COMPREHENSIVE METABOLIC PANEL
ALBUMIN: 3.7 g/dL (ref 3.5–5.0)
ALK PHOS: 104 U/L (ref 38–126)
ALT: 12 U/L — AB (ref 17–63)
ANION GAP: 12 (ref 5–15)
AST: 24 U/L (ref 15–41)
BILIRUBIN TOTAL: 0.8 mg/dL (ref 0.3–1.2)
BUN: 79 mg/dL — AB (ref 6–20)
CALCIUM: 8.3 mg/dL — AB (ref 8.9–10.3)
CO2: 31 mmol/L (ref 22–32)
CREATININE: 9.51 mg/dL — AB (ref 0.61–1.24)
Chloride: 90 mmol/L — ABNORMAL LOW (ref 101–111)
GFR calc Af Amer: 7 mL/min — ABNORMAL LOW (ref >60)
GFR calc non Af Amer: 6 mL/min — ABNORMAL LOW (ref >60)
GLUCOSE: 119 mg/dL — AB (ref 65–99)
Potassium: 2.7 mmol/L — CL (ref 3.5–5.1)
Sodium: 133 mmol/L — ABNORMAL LOW (ref 135–145)
Total Protein: 6.2 g/dL — ABNORMAL LOW (ref 6.5–8.1)

## 2016-11-06 LAB — LITHIUM LEVEL
LITHIUM LVL: 2.3 mmol/L — AB (ref 0.60–1.20)
Lithium Lvl: 2.5 mmol/L (ref 0.60–1.20)

## 2016-11-06 LAB — CBC
HCT: 32.1 % — ABNORMAL LOW (ref 40.0–52.0)
HEMOGLOBIN: 10.8 g/dL — AB (ref 13.0–18.0)
MCH: 29.2 pg (ref 26.0–34.0)
MCHC: 33.8 g/dL (ref 32.0–36.0)
MCV: 86.6 fL (ref 80.0–100.0)
PLATELETS: 174 10*3/uL (ref 150–440)
RBC: 3.71 MIL/uL — ABNORMAL LOW (ref 4.40–5.90)
RDW: 12.5 % (ref 11.5–14.5)
WBC: 13.4 10*3/uL — ABNORMAL HIGH (ref 3.8–10.6)

## 2016-11-06 LAB — CK: CK TOTAL: 983 U/L — AB (ref 49–397)

## 2016-11-06 LAB — PHOSPHORUS
PHOSPHORUS: 2.3 mg/dL — AB (ref 2.5–4.6)
Phosphorus: 6.4 mg/dL — ABNORMAL HIGH (ref 2.5–4.6)

## 2016-11-06 LAB — MAGNESIUM: Magnesium: 2.3 mg/dL (ref 1.7–2.4)

## 2016-11-06 MED ORDER — POTASSIUM CHLORIDE CRYS ER 20 MEQ PO TBCR
40.0000 meq | EXTENDED_RELEASE_TABLET | Freq: Once | ORAL | Status: AC
  Administered 2016-11-06: 40 meq via ORAL
  Filled 2016-11-06: qty 2

## 2016-11-06 MED ORDER — PERPHENAZINE 2 MG PO TABS
2.0000 mg | ORAL_TABLET | Freq: Every day | ORAL | Status: DC
  Administered 2016-11-06 – 2016-11-07 (×2): 2 mg via ORAL
  Filled 2016-11-06 (×3): qty 1

## 2016-11-06 MED ORDER — ALTEPLASE 2 MG IJ SOLR: 2.0000 mg | Freq: Once | INTRAMUSCULAR | Status: DC | PRN

## 2016-11-06 MED ORDER — LIDOCAINE HCL (PF) 1 % IJ SOLN
5.0000 mL | INTRAMUSCULAR | Status: DC | PRN
  Filled 2016-11-06: qty 5

## 2016-11-06 MED ORDER — CLOZAPINE 100 MG PO TABS
300.0000 mg | ORAL_TABLET | Freq: Every day | ORAL | Status: DC
  Administered 2016-11-06 – 2016-11-10 (×5): 300 mg via ORAL
  Filled 2016-11-06 (×8): qty 3

## 2016-11-06 MED ORDER — POTASSIUM CHLORIDE CRYS ER 20 MEQ PO TBCR
40.0000 meq | EXTENDED_RELEASE_TABLET | Freq: Once | ORAL | Status: AC
Start: 2016-11-06 — End: 2016-11-06
  Administered 2016-11-06: 40 meq via ORAL
  Filled 2016-11-06: qty 2

## 2016-11-06 MED ORDER — SODIUM CHLORIDE 0.9 % IV SOLN: 100.0000 mL | INTRAVENOUS | Status: DC | PRN

## 2016-11-06 MED ORDER — HEPARIN SODIUM (PORCINE) 1000 UNIT/ML DIALYSIS
1000.0000 [IU] | INTRAMUSCULAR | Status: DC | PRN
  Filled 2016-11-06: qty 1

## 2016-11-06 MED ORDER — LIDOCAINE-PRILOCAINE 2.5-2.5 % EX CREA
1.0000 "application " | TOPICAL_CREAM | CUTANEOUS | Status: DC | PRN
  Filled 2016-11-06: qty 5

## 2016-11-06 MED ORDER — PENTAFLUOROPROP-TETRAFLUOROETH EX AERO
1.0000 "application " | INHALATION_SPRAY | CUTANEOUS | Status: DC | PRN
  Filled 2016-11-06: qty 30

## 2016-11-06 NOTE — Progress Notes (Signed)
PT Cancellation Note  Patient Details Name: Nicholas Cole MRN: [id number] DOB: 01/26/1966   Cancelled Treatment:    Reason Eval/Treat Not Completed: Medical issues which prohibited therapy; Patient Potassium level 2.7 and is not currently appropriate for therapy. Pt will evaluate at next appropriate time.  Blythe Stanford, PT DPT 11/06/16, 12:49 PM [phone]  Blythe Stanford 11/06/2016, 12:48 PM

## 2016-11-06 NOTE — Progress Notes (Signed)
Patient ID: Nicholas Cole, male   DOB: [birth year], 51 y.o.   MRN: [id number]   Called by nursing regarding right bundle branch block reported by telemetry monitoring. Central telemetry called nursing to inform her about transient wide complex right bundle branch block. Repeat EKG is essentially unchanged. Patient remains asymptomatic with the exception of lethargy. He denies chest pain, shortness of breath, palpitations, diaphoresis.  I will continue IVF hydration with KCl. I will order repeat BMP, mag, phosphorus and a lithium level. I have discontinued his lithium for the morning given level of 2.7. I also discussed with Dr. Holley Raring who will see the patient in the a.m and consider dialysis if the patient is not improving with fluid hydration. Renal sono is pending. Monitor closely.

## 2016-11-06 NOTE — Progress Notes (Signed)
Pt with waxing and waning mental status.  He understood dialysis when I explained to him earlier, RN reexplained it and he became confused.  Therefore both Dr. Delana Meyer and I have provided emergency consent for the patient at this time.

## 2016-11-06 NOTE — Consult Note (Addendum)
[place] [facility] Consult   Reason for Consult:  Medication management. Referring Physician:  Dr. Doy Hutching Patient Identification: Nicholas Cole MRN:  [id number] Principal Diagnosis: ARF (acute renal failure) [facility]) Diagnosis:   Patient Active Problem List   Diagnosis Date Noted  . Lithium toxicity [T56.891A] 11/06/2016  . ARF (acute renal failure) ([place]) [N17.9] 11/05/2016  . Dehydration [E86.0] 11/05/2016  . Rhabdomyolysis [M62.82] 11/05/2016  . Schizoaffective disorder, bipolar type ([place]) [F25.0] 11/05/2016    Total Time spent with patient: 1 hour  Subjective:  Tremor and dizziness.  Identifying data. Nicholas Cole is a 51 year old male with a history of schizoaffective disorder.  Chief complaint. "I fainted twice."  History of present illness. Information was obtained from the patient and the chart. The patient was brought to the hospital after he was found on the floor in his apartment. The patient was dehydrated with elevated lithium level of 2.7. He was admitted to medical floor and will receive dialysis for acute renal failure. At home he was treated with a combination of clozapine 500 mg nightly, Trilafon 2 mg nightly and lithium 300 mg in the morning 600 at bedtime prescribed by [facility]. All his psychiatric medications were discontinued on admission.  The patient reports that 2 weeks prior to admission he stopped taking all his medications because he felt "week and had tremors" suggestive of lithium toxicity. She remembers that shortly prior to admission he was outside in the hit and was warned by the landlord to go inside to cool down but he would not listen. He remembers falling several times. He believes that he was on the floor for 2 minutes on before the police came in. According to the chart it was his friend who called for wellness check unable to get in touch with the patient. Today Nicholas Cole is in good spirits. He isn't sure that good Reita Cliche takes care of him. He  notes that he has high kidney trouble and will be in the hospital for a week or so to receive dialysis. He denies any symptoms of depression, anxiety, or psychosis and is ready to restart his medications. He does recognize me from previous admission. He assures me that he no longer uses drugs or alcohol.  Spoke with the pharmacist. Clozapine has not been dispensed from the pharmacy since April. Last blood draw 09/06/2016. I will register him with Clozapine [facility]. ANC 11.7 on 11/05/2016.  Past psychiatric history. There is a long history of severe mental illness. He was hospitalized at [facility] for many years but was released from the state hospital when she was 57. He has been tried on numerous medications the current combination of Clozaril and lithium were for him well and keep him away from the hospital since 2012. He denies ever attempting suicide.  Family psychiatric history. Nonreported.  Social history. He is originally from [place]. He lives independently in his own apartment. He was with the [employer] act team for 7 years but was dissatisfied with their services. He switched to [facility] to his satisfaction. He is asking me to call Colletta Maryland, his case worker, at [facility] to let her know that he is in the hospital. I left a message on their crisis number.  Risk to Self: Is patient at risk for suicide?: No Risk to Others:   Prior Inpatient Therapy:   Prior Outpatient Therapy:    Past Medical History: History reviewed. No pertinent past medical history. History reviewed. No pertinent surgical history. Family History: History reviewed.  No pertinent family history.  Social History:  History  Alcohol use Not on file     History  Drug use: Unknown    Social History   Social History  . Marital status: Single    Spouse name: N/A  . Number of children: N/A  . Years of education: N/A   Social History Main Topics  . Smoking status: Former Research scientist (life sciences)  . Smokeless tobacco: Never Used  .  Alcohol use None  . Drug use: Unknown  . Sexual activity: Not Asked   Other Topics Concern  . None   Social History Narrative  . None   Additional Social History:    Allergies:  No Known Allergies  Labs:  Results for orders placed or performed during the hospital encounter of 11/05/16 (from the past 48 hour(s))  Comprehensive metabolic panel     Status: Abnormal   Collection Time: 11/05/16  6:57 PM  Result Value Ref Range   Sodium 130 (L) 135 - 145 mmol/L   Potassium 3.0 (L) 3.5 - 5.1 mmol/L   Chloride 81 (L) 101 - 111 mmol/L   CO2 29 22 - 32 mmol/L   Glucose, Bld 179 (H) 65 - 99 mg/dL   BUN 78 (H) 6 - 20 mg/dL   Creatinine, Ser 11.34 (H) 0.61 - 1.24 mg/dL   Calcium 9.0 8.9 - 10.3 mg/dL   Total Protein 7.7 6.5 - 8.1 g/dL   Albumin 4.8 3.5 - 5.0 g/dL   AST 30 15 - 41 U/L   ALT 13 (L) 17 - 63 U/L   Alkaline Phosphatase 136 (H) 38 - 126 U/L   Total Bilirubin 1.3 (H) 0.3 - 1.2 mg/dL   GFR calc non Af Amer 5 (L) >60 mL/min   GFR calc Af Amer 5 (L) >60 mL/min    Comment: (NOTE) The eGFR has been calculated using the CKD EPI equation. This calculation has not been validated in all clinical situations. eGFR's persistently <60 mL/min signify possible Chronic Kidney Disease.    Anion gap 20 (H) 5 - 15  Ethanol     Status: None   Collection Time: 11/05/16  6:57 PM  Result Value Ref Range   Alcohol, Ethyl (B) <5 <5 mg/dL    Comment:        LOWEST DETECTABLE LIMIT FOR SERUM ALCOHOL IS 5 mg/dL FOR MEDICAL PURPOSES ONLY   Acetaminophen level     Status: Abnormal   Collection Time: 11/05/16  6:57 PM  Result Value Ref Range   Acetaminophen (Tylenol), Serum <10 (L) 10 - 30 ug/mL    Comment:        THERAPEUTIC CONCENTRATIONS VARY SIGNIFICANTLY. A RANGE OF 10-30 ug/mL MAY BE AN EFFECTIVE CONCENTRATION FOR MANY PATIENTS. HOWEVER, SOME ARE BEST TREATED AT CONCENTRATIONS OUTSIDE THIS RANGE. ACETAMINOPHEN CONCENTRATIONS >150 ug/mL AT 4 HOURS AFTER INGESTION AND >50 ug/mL AT  12 HOURS AFTER INGESTION ARE OFTEN ASSOCIATED WITH TOXIC REACTIONS.   Lipase, blood     Status: Abnormal   Collection Time: 11/05/16  6:57 PM  Result Value Ref Range   Lipase 82 (H) 11 - 51 U/L  Troponin I     Status: None   Collection Time: 11/05/16  6:57 PM  Result Value Ref Range   Troponin I <8.76 <8.11 ng/mL  Salicylate level     Status: None   Collection Time: 11/05/16  6:57 PM  Result Value Ref Range   Salicylate Lvl <5.7 2.8 - 30.0 mg/dL  CBC with Differential  Status: Abnormal   Collection Time: 11/05/16  6:57 PM  Result Value Ref Range   WBC 12.7 (H) 3.8 - 10.6 K/uL   RBC 4.17 (L) 4.40 - 5.90 MIL/uL   Hemoglobin 12.0 (L) 13.0 - 18.0 g/dL   HCT 35.7 (L) 40.0 - 52.0 %   MCV 85.6 80.0 - 100.0 fL   MCH 28.7 26.0 - 34.0 pg   MCHC 33.5 32.0 - 36.0 g/dL   RDW 12.5 11.5 - 14.5 %   Platelets 188 150 - 440 K/uL   Neutrophils Relative % 93 %   Neutro Abs 11.7 (H) 1.4 - 6.5 K/uL   Lymphocytes Relative 2 %   Lymphs Abs 0.3 (L) 1.0 - 3.6 K/uL   Monocytes Relative 5 %   Monocytes Absolute 0.6 0.2 - 1.0 K/uL   Eosinophils Relative 0 %   Eosinophils Absolute 0.0 0 - 0.7 K/uL   Basophils Relative 0 %   Basophils Absolute 0.0 0 - 0.1 K/uL  CK     Status: Abnormal   Collection Time: 11/05/16  6:57 PM  Result Value Ref Range   Total CK 700 (H) 49 - 397 U/L  Lithium level     Status: Abnormal   Collection Time: 11/05/16  6:57 PM  Result Value Ref Range   Lithium Lvl 2.70 (HH) 0.60 - 1.20 mmol/L    Comment: CRITICAL RESULT CALLED TO, READ BACK BY AND VERIFIED WITH DAVID BESANTI AT 2300 ON 11/05/16 RWW   Lactic acid, plasma     Status: Abnormal   Collection Time: 11/05/16  7:43 PM  Result Value Ref Range   Lactic Acid, Venous 2.3 (HH) 0.5 - 1.9 mmol/L    Comment: CRITICAL RESULT CALLED TO, READ BACK BY AND VERIFIED WITH SONJA WEAVER 11/05/16 @ 2020  MLK   Blood gas, venous     Status: Abnormal   Collection Time: 11/05/16  7:43 PM  Result Value Ref Range   pH, Ven 7.41  7.250 - 7.430   pCO2, Ven 52 44.0 - 60.0 mmHg   pO2, Ven 32.0 32.0 - 45.0 mmHg   Bicarbonate 33.0 (H) 20.0 - 28.0 mmol/L   Acid-Base Excess 7.1 (H) 0.0 - 2.0 mmol/L   O2 Saturation 62.4 %   Patient temperature 37.0    Collection site VEIN    Sample type VENOUS   Lactic acid, plasma     Status: Abnormal   Collection Time: 11/05/16  8:43 PM  Result Value Ref Range   Lactic Acid, Venous 3.0 (HH) 0.5 - 1.9 mmol/L    Comment: CRITICAL RESULT CALLED TO, READ BACK BY AND VERIFIED WITH SONJA WEAVER 11/05/16 @ 2123  MLK   Lithium level     Status: Abnormal   Collection Time: 11/05/16 11:51 PM  Result Value Ref Range   Lithium Lvl 2.50 (HH) 0.60 - 1.20 mmol/L    Comment: CRITICAL RESULT CALLED TO, READ BACK BY AND VERIFIED WITH DAVID BESANTI AT 0045 ON 11/06/16 RWW   Basic metabolic panel     Status: Abnormal   Collection Time: 11/05/16 11:51 PM  Result Value Ref Range   Sodium 132 (L) 135 - 145 mmol/L   Potassium 3.1 (L) 3.5 - 5.1 mmol/L   Chloride 87 (L) 101 - 111 mmol/L   CO2 31 22 - 32 mmol/L   Glucose, Bld 141 (H) 65 - 99 mg/dL   BUN 77 (H) 6 - 20 mg/dL   Creatinine, Ser 10.16 (H) 0.61 - 1.24 mg/dL  Calcium 8.6 (L) 8.9 - 10.3 mg/dL   GFR calc non Af Amer 5 (L) >60 mL/min   GFR calc Af Amer 6 (L) >60 mL/min    Comment: (NOTE) The eGFR has been calculated using the CKD EPI equation. This calculation has not been validated in all clinical situations. eGFR's persistently <60 mL/min signify possible Chronic Kidney Disease.    Anion gap 14 5 - 15  Magnesium     Status: None   Collection Time: 11/05/16 11:51 PM  Result Value Ref Range   Magnesium 2.3 1.7 - 2.4 mg/dL  Phosphorus     Status: Abnormal   Collection Time: 11/05/16 11:51 PM  Result Value Ref Range   Phosphorus 6.4 (H) 2.5 - 4.6 mg/dL  Comprehensive metabolic panel     Status: Abnormal   Collection Time: 11/06/16  3:58 AM  Result Value Ref Range   Sodium 133 (L) 135 - 145 mmol/L   Potassium 2.7 (LL) 3.5 - 5.1 mmol/L     Comment: CRITICAL RESULT CALLED TO, READ BACK BY AND VERIFIED WITH STEVEN SYKES AT 0433 ON 11/06/16 RWW    Chloride 90 (L) 101 - 111 mmol/L   CO2 31 22 - 32 mmol/L   Glucose, Bld 119 (H) 65 - 99 mg/dL   BUN 79 (H) 6 - 20 mg/dL   Creatinine, Ser 7.50 (H) 0.61 - 1.24 mg/dL   Calcium 8.3 (L) 8.9 - 10.3 mg/dL   Total Protein 6.2 (L) 6.5 - 8.1 g/dL   Albumin 3.7 3.5 - 5.0 g/dL   AST 24 15 - 41 U/L   ALT 12 (L) 17 - 63 U/L   Alkaline Phosphatase 104 38 - 126 U/L   Total Bilirubin 0.8 0.3 - 1.2 mg/dL   GFR calc non Af Amer 6 (L) >60 mL/min   GFR calc Af Amer 7 (L) >60 mL/min    Comment: (NOTE) The eGFR has been calculated using the CKD EPI equation. This calculation has not been validated in all clinical situations. eGFR's persistently <60 mL/min signify possible Chronic Kidney Disease.    Anion gap 12 5 - 15  CBC     Status: Abnormal   Collection Time: 11/06/16  3:58 AM  Result Value Ref Range   WBC 13.4 (H) 3.8 - 10.6 K/uL   RBC 3.71 (L) 4.40 - 5.90 MIL/uL   Hemoglobin 10.8 (L) 13.0 - 18.0 g/dL   HCT 00.7 (L) 51.7 - 89.0 %   MCV 86.6 80.0 - 100.0 fL   MCH 29.2 26.0 - 34.0 pg   MCHC 33.8 32.0 - 36.0 g/dL   RDW 82.7 60.1 - 00.6 %   Platelets 174 150 - 440 K/uL  CK     Status: Abnormal   Collection Time: 11/06/16  3:58 AM  Result Value Ref Range   Total CK 983 (H) 49 - 397 U/L  Urinalysis, Complete w Microscopic     Status: Abnormal   Collection Time: 11/06/16  8:33 AM  Result Value Ref Range   Color, Urine YELLOW (A) YELLOW   APPearance HAZY (A) CLEAR   Specific Gravity, Urine 1.009 1.005 - 1.030   pH 5.0 5.0 - 8.0   Glucose, UA NEGATIVE NEGATIVE mg/dL   Hgb urine dipstick MODERATE (A) NEGATIVE   Bilirubin Urine NEGATIVE NEGATIVE   Ketones, ur NEGATIVE NEGATIVE mg/dL   Protein, ur NEGATIVE NEGATIVE mg/dL   Nitrite NEGATIVE NEGATIVE   Leukocytes, UA NEGATIVE NEGATIVE   RBC / HPF 0-5 0 - 5  RBC/hpf   WBC, UA 0-5 0 - 5 WBC/hpf   Bacteria, UA RARE (A) NONE SEEN    Squamous Epithelial / LPF 0-5 (A) NONE SEEN   Hyaline Casts, UA PRESENT   Lithium level     Status: Abnormal   Collection Time: 11/06/16  9:08 AM  Result Value Ref Range   Lithium Lvl 2.30 (HH) 0.60 - 1.20 mmol/L    Comment: CRITICAL RESULT CALLED TO, READ BACK BY AND VERIFIED WITH VERN BOOKER'@0947'$  ON 11/06/16 BY HKP     Current Facility-Administered Medications  Medication Dose Route Frequency Provider Last Rate Last Dose  . 0.9 % NaCl with KCl 20 mEq/ L  infusion   Intravenous Continuous Idelle Crouch, MD 100 mL/hr at 11/06/16 0945    . acetaminophen (TYLENOL) tablet 650 mg  650 mg Oral Q6H PRN Idelle Crouch, MD       Or  . acetaminophen (TYLENOL) suppository 650 mg  650 mg Rectal Q6H PRN Idelle Crouch, MD      . aspirin EC tablet 81 mg  81 mg Oral Daily Idelle Crouch, MD   81 mg at 11/06/16 0947  . bisacodyl (DULCOLAX) suppository 10 mg  10 mg Rectal Daily PRN Idelle Crouch, MD      . cefTRIAXone (ROCEPHIN) 1 g in dextrose 5 % 50 mL IVPB  1 g Intravenous Q24H Idelle Crouch, MD   Stopped at 11/06/16 0700  . docusate sodium (COLACE) capsule 100 mg  100 mg Oral BID Idelle Crouch, MD   100 mg at 11/06/16 0947  . famotidine (PEPCID) tablet 20 mg  20 mg Oral Daily Idelle Crouch, MD   20 mg at 11/06/16 0947  . heparin injection 5,000 Units  5,000 Units Subcutaneous Q8H Idelle Crouch, MD   5,000 Units at 11/06/16 0553  . LORazepam (ATIVAN) injection 0.5 mg  0.5 mg Intravenous Q4H PRN Idelle Crouch, MD      . ondansetron [facility]) tablet 4 mg  4 mg Oral Q6H PRN Idelle Crouch, MD       Or  . ondansetron (ZOFRAN) injection 4 mg  4 mg Intravenous Q6H PRN Idelle Crouch, MD        Musculoskeletal: Strength & Muscle Tone: within normal limits Gait & Station: normal Patient leans: N/A  Psychiatric Specialty Exam: I reviewed physical examination performed on the medical floor and agree with the findings. Physical Exam  Nursing note and vitals  reviewed. Psychiatric: He has a normal mood and affect. His speech is normal and behavior is normal. Thought content normal. Cognition and memory are normal. He expresses impulsivity.    Review of Systems  Neurological: Positive for dizziness and tremors.  All other systems reviewed and are negative.   Blood pressure (!) 96/53, pulse 73, temperature 98.7 F (37.1 C), temperature source Oral, resp. rate 17, height '5\' 9"'$  (1.753 m), weight 77.1 kg (170 lb), SpO2 100 %.Body mass index is 25.1 kg/m.  General Appearance: Casual  Eye Contact:  Good  Speech:  Clear and Coherent  Volume:  Normal  Mood:  Euthymic  Affect:  Appropriate  Thought Process:  Goal Directed and Descriptions of Associations: Intact  Orientation:  Full (Time, Place, and Person)  Thought Content:  WDL  Suicidal Thoughts:  No  Homicidal Thoughts:  No  Memory:  Immediate;   Fair Recent;   Fair Remote;   Fair  Judgement:  Impaired  Insight:  Present  Psychomotor  Activity:  Normal  Concentration:  Concentration: Fair and Attention Span: Fair  Recall:  [employer] of Knowledge:  Fair  Language:  Fair  Akathisia:  No  Handed:  Right  AIMS (if indicated):     Assets:  Communication Skills Desire for Improvement Financial Resources/Insurance Housing Resilience Social Support  ADL's:  Intact  Cognition:  WNL  Sleep:        Treatment Plan Summary: Daily contact with patient to assess and evaluate symptoms and progress in treatment and Medication management   PLAN: 1. Please hold Lithium.  2. I will restart Clozapine 300 mg tonight. We will increase gradually.  3. I will restart Trilafon 2 mg nightly.  4. Dr. Weber Cooks will follow along.    Disposition: No evidence of imminent risk to self or others at present.   Patient does not meet criteria for psychiatric inpatient admission. Supportive therapy provided about ongoing stressors. Discussed crisis plan, support from social network, calling 911, coming  to the Emergency Department, and calling Suicide Hotline.  Orson Slick, MD 11/06/2016 2:56 PM

## 2016-11-06 NOTE — Progress Notes (Signed)
Post dialysis assessment 

## 2016-11-06 NOTE — Progress Notes (Signed)
PRE DIALYSIS ASSESSMENT 

## 2016-11-06 NOTE — Op Note (Signed)
  OPERATIVE NOTE   PROCEDURE: 1. Insertion of temporary dialysis catheter catheter right femoral approach.  PRE-OPERATIVE DIAGNOSIS: Acute renal failure; lithium toxicity  POST-OPERATIVE DIAGNOSIS: Same  SURGEON: Katha Cabal M.D.  ANESTHESIA: 1% lidocaine local infiltration  ESTIMATED BLOOD LOSS: Minimal cc  INDICATIONS:   Nicholas Cole is a 51 y.o. male who presents with acute renal failure associated with lithium toxicity.  DESCRIPTION: After obtaining full informed written consent, the patient was positioned supine. The right groin was prepped and draped in a sterile fashion. Ultrasound was placed in a sterile sleeve. Ultrasound was utilized to identify the right common femoral vein which is noted to be echolucent and compressible indicating patency. Images recorded for the permanent record. Under real-time visualization a Seldinger needle is inserted into the vein and the guidewires advanced without difficulty. Small counterincision was made at the wire insertion site. Dilator is passed over the wire and the temporary dialysis catheter catheter is fed over the wire without difficulty.  All lumens aspirate and flush easily and are packed with heparin saline. Catheter secured to the skin of the right thigh with 2-0 silk. A sterile dressing is applied with Biopatch.  COMPLICATIONS: None  CONDITION: Unchanged  Hortencia Pilar Office:  [phone] 11/06/2016, 3:50 PM

## 2016-11-06 NOTE — Progress Notes (Signed)
Patient ID: Nicholas Cole, male   DOB: [birth year], 51 y.o.   MRN: [id number]  [facility] PROGRESS NOTE  Nicholas Cole OTL:[id number] DOB: 31-Aug-1965 DOA: 11/05/2016 PCP: No primary care provider on file.  HPI/Subjective: Patient's major complaint is tremor and weakness. He had a fall at home. He states the police came and brought him to the hospital. Patient is not the best historian but does answer questions appropriately.  Objective: Vitals:   11/05/16 2111 11/06/16 0414  BP: (!) 142/83 (!) 96/53  Pulse: 89 74  Resp: 16 18  Temp: 99.4 F (37.4 C) 98.4 F (36.9 C)    Filed Weights   11/05/16 1855  Weight: 77.1 kg (170 lb)    ROS: Review of Systems  Constitutional: Negative for chills and fever.  Eyes: Negative for blurred vision.  Respiratory: Negative for cough and shortness of breath.   Cardiovascular: Negative for chest pain.  Gastrointestinal: Negative for abdominal pain, constipation, diarrhea, nausea and vomiting.  Genitourinary: Negative for dysuria.  Musculoskeletal: Negative for joint pain.  Neurological: Positive for tremors and weakness. Negative for dizziness and headaches.   Exam: Physical Exam  HENT:  Nose: No mucosal edema.  Mouth/Throat: No oropharyngeal exudate or posterior oropharyngeal edema.  Eyes: Conjunctivae, EOM and lids are normal. Pupils are equal, round, and reactive to light.  Neck: No JVD present. Carotid bruit is not present. No edema present. No thyroid mass and no thyromegaly present.  Cardiovascular: S1 normal and S2 normal.  Exam reveals no gallop.   No murmur heard. Pulses:      Dorsalis pedis pulses are 2+ on the right side, and 2+ on the left side.  Respiratory: No respiratory distress. He has no wheezes. He has no rhonchi. He has no rales.  GI: Soft. Bowel sounds are normal. There is no tenderness.  Musculoskeletal:       Right ankle: He exhibits no swelling.       Left ankle: He exhibits no swelling.  Lymphadenopathy:   He has no cervical adenopathy.  Neurological: He is alert. He displays tremor. No cranial nerve deficit.  Skin: Skin is warm. No rash noted. Nails show no clubbing.  Psychiatric: He has a normal mood and affect.      Data Reviewed: Basic Metabolic Panel:  Recent Labs Lab 11/05/16 1857 11/05/16 2351 11/06/16 0358  NA 130* 132* 133*  K 3.0* 3.1* 2.7*  CL 81* 87* 90*  CO2 29 31 31   GLUCOSE 179* 141* 119*  BUN 78* 77* 79*  CREATININE 11.34* 10.16* 9.51*  CALCIUM 9.0 8.6* 8.3*  MG  --  2.3  --   PHOS  --  6.4*  --    Liver Function Tests:  Recent Labs Lab 11/05/16 1857 11/06/16 0358  AST 30 24  ALT 13* 12*  ALKPHOS 136* 104  BILITOT 1.3* 0.8  PROT 7.7 6.2*  ALBUMIN 4.8 3.7    Recent Labs Lab 11/05/16 1857  LIPASE 82*   CBC:  Recent Labs Lab 11/05/16 1857 11/06/16 0358  WBC 12.7* 13.4*  NEUTROABS 11.7*  --   HGB 12.0* 10.8*  HCT 35.7* 32.1*  MCV 85.6 86.6  PLT 188 174   Cardiac Enzymes:  Recent Labs Lab 11/05/16 1857 11/06/16 0358  CKTOTAL 700* 983*  TROPONINI <0.03  --      Studies: Ct Head Wo Contrast  Result Date: 11/05/2016 CLINICAL DATA:  Schizoaffective disorder with hypertension, found on floor with lethargy. EXAM: CT HEAD WITHOUT CONTRAST TECHNIQUE: Contiguous axial images  were obtained from the base of the skull through the vertex without intravenous contrast. COMPARISON:  None. FINDINGS: Brain: No evidence of acute infarction, hemorrhage, hydrocephalus, extra-axial collection or mass lesion/mass effect allowing for slight motion degraded artifacts. Vascular: No hyperdense vessel or unexpected calcification. Skull: Negative for fracture or focal lesion. Sinuses/Orbits: Intact orbits and globes. No retrobulbar abnormality. The mastoids and visualized paranasal sinuses are nonacute. Other: None IMPRESSION: No acute intracranial abnormality. Electronically Signed   By: Ashley Royalty M.D.   On: 11/05/2016 20:40   US Renal  Result Date:  11/06/2016 CLINICAL DATA:  Acute renal failure EXAM: RENAL / URINARY TRACT ULTRASOUND COMPLETE COMPARISON:  None. FINDINGS: Right Kidney: Length: 10.1 cm. Echogenicity is mildly increased. Renal cortical thickness is normal. No mass, perinephric fluid, or hydronephrosis visualized. No sonographically demonstrable calculus or ureterectasis. Left Kidney: Length: 10.4 cm. Echogenicity is mildly increased. Renal cortical thickness is normal. No mass, perinephric fluid, or hydronephrosis visualized. No sonographically demonstrable calculus or ureterectasis. Bladder: Appears normal for degree of bladder distention. IMPRESSION: Mild increase in renal echogenicity bilaterally, a finding which may be indicative of medical renal disease. Renal cortical thickness normal. No obstructing focus on either side. Study otherwise unremarkable. Electronically Signed   By: Lowella Grip III M.D.   On: 11/06/2016 07:55   Dg Chest Portable 1 View  Result Date: 11/05/2016 CLINICAL DATA:  Patient was found in his apartment with the windows closed and no AC on, incontinent to urine, slurred speech and confusion. Patient has dried mucous membranes. Patient was hypotensive upon EMs arrival at 76/41; EXAM: PORTABLE CHEST 1 VIEW COMPARISON:  12/05/2013 FINDINGS: Numerous leads and wires project over the chest. Midline trachea. Normal heart size and mediastinal contours. No pleural effusion or pneumothorax. Clear lungs. IMPRESSION: No acute cardiopulmonary disease. Electronically Signed   By: Abigail Miyamoto M.D.   On: 11/05/2016 19:29    Scheduled Meds: . aspirin EC  81 mg Oral Daily  . docusate sodium  100 mg Oral BID  . famotidine  20 mg Oral Daily  . heparin  5,000 Units Subcutaneous Q8H   Continuous Infusions: . 0.9 % NaCl with KCl 20 mEq / L 100 mL/hr at 11/05/16 2327  . cefTRIAXone (ROCEPHIN)  IV 1 g (11/05/16 2343)    Assessment/Plan:  1. Lithium toxicity. On admission with hemoglobin level was 2.7. Today it's 2.5.  Normal range is 0-1.2. Lithium of course was held. Case discussed with nephrology Dr. Holley Raring to see the patient to determine whether dialysis is needed. 2. Acute kidney injury on chronic kidney disease stage 2. IV fluid hydration. Nephrology consultation. Renal ultrasound showed medical renal disease. Creatinine still very impaired. 3. Hypokalemia. Watch closely with replacement since the patient does have acute kidney injury 4. Rhabdomyolysis probably from fall. IV fluid hydration continue to monitor CPK 5. Schizoaffective disorder. Psychiatric consultation for medication management. Likely lithium will not be a good medication at this point. 6. Unclear why antibiotics were started. Send off a urinalysis  Code Status:     Code Status Orders        Start     Ordered   11/05/16 2206  Full code  Continuous     11/05/16 2205    Code Status History    Date Active Date Inactive Code Status Order ID Comments User Context   This patient has a current code status but no historical code status.    Advance Directive Documentation     Most Recent Value  Type of  Social research officer, government  Pre-existing out of facility DNR order (yellow form or pink MOST form)  -  "MOST" Form in Place?  -      Disposition Plan: Creatinine must improve prior to disposition  Consultants:  Nephrology  Psychiatry  Time spent: 35 minutes  [place], [place]

## 2016-11-06 NOTE — Progress Notes (Signed)
HD STARTED  

## 2016-11-06 NOTE — Consult Note (Signed)
[facility] CONSULT NOTE    Date: 11/06/2016                  Patient Name:  Nicholas Cole  MRN: [id number]  DOB: 10-21-1965  Age / Sex: 51 y.o., male         PCP: No primary care provider on file.                 Service Requesting Consult: Hospitalist                 Reason for Consult: Acute renal failure, lithium toxicity            History of Present Illness: Patient is a 51 y.o. male with a PMHx of Schizoaffective disorder hypertension, GERD who was admitted to [facility] on 11/05/2016 for evaluation of generalized weakness and recent falls. Patient is awake and alert and able to offer history at the moment. He states that over the past 3 weeks he's not been eating or drinking very well. He was taking his medications which included lithium.  Upon arrival he was found to have multiple severe metabolic derangements. BUN was 78 with a creatinine of 11. Potassium was also low at 3.0.  Initial lithium level was found to be 2.7. With IV fluid hydration the second lithium level came down to 2.5. This a.m. lithium level was down to 2.3. The patient does have a mild tremor when holding a glass to drink. The patient's renal function has also improved as creatinine is down to 9.   Medications: Outpatient medications: Prescriptions Prior to Admission  Medication Sig Dispense Refill Last Dose  . aspirin EC 81 MG tablet Take 81 mg by mouth daily.   11/05/2016 at am  . atenolol (TENORMIN) 25 MG tablet Take 25 mg by mouth daily.   11/05/2016 at am  . cloZAPine (CLOZARIL) 100 MG tablet Take 500 mg by mouth daily.   11/05/2016 at am  . lithium carbonate 300 MG capsule Take 300-600 mg by mouth See admin instructions. tk 1 cap qam and 2 caps qhs   11/05/2016 at am  . perphenazine (TRILAFON) 2 MG tablet Take 2 mg by mouth at bedtime.   11/04/2016 at qhs  . ranitidine (ZANTAC) 150 MG tablet Take 150 mg by mouth 2 (two) times daily.   11/05/2016 at am    Current medications: Current  Facility-Administered Medications  Medication Dose Route Frequency Provider Last Rate Last Dose  . 0.9 % NaCl with KCl 20 mEq/ L  infusion   Intravenous Continuous Idelle Crouch, MD 100 mL/hr at 11/06/16 0945    . acetaminophen (TYLENOL) tablet 650 mg  650 mg Oral Q6H PRN Idelle Crouch, MD       Or  . acetaminophen (TYLENOL) suppository 650 mg  650 mg Rectal Q6H PRN Idelle Crouch, MD      . aspirin EC tablet 81 mg  81 mg Oral Daily Idelle Crouch, MD   81 mg at 11/06/16 0947  . bisacodyl (DULCOLAX) suppository 10 mg  10 mg Rectal Daily PRN Idelle Crouch, MD      . cefTRIAXone (ROCEPHIN) 1 g in dextrose 5 % 50 mL IVPB  1 g Intravenous Q24H Idelle Crouch, MD   Stopped at 11/06/16 0700  . docusate sodium (COLACE) capsule 100 mg  100 mg Oral BID Idelle Crouch, MD   100 mg at 11/06/16 0947  . famotidine (PEPCID) tablet 20 mg  20 mg Oral Daily Idelle Crouch, MD   20 mg at 11/06/16 0947  . heparin injection 5,000 Units  5,000 Units Subcutaneous Q8H Idelle Crouch, MD   5,000 Units at 11/06/16 0553  . LORazepam (ATIVAN) injection 0.5 mg  0.5 mg Intravenous Q4H PRN Idelle Crouch, MD      . ondansetron [facility]) tablet 4 mg  4 mg Oral Q6H PRN Idelle Crouch, MD       Or  . ondansetron (ZOFRAN) injection 4 mg  4 mg Intravenous Q6H PRN Idelle Crouch, MD          Allergies: No Known Allergies    Past Medical History: Schizoaffective disorder next line hypertension GERD  Past Surgical History: History reviewed. No pertinent surgical history.   Family History: No family history of end-stage renal disease.   Social History: Social History   Social History  . Marital status: Single    Spouse name: N/A  . Number of children: N/A  . Years of education: N/A   Occupational History  . Not on file.   Social History Main Topics  . Smoking status: Former Research scientist (life sciences)  . Smokeless tobacco: Never Used  . Alcohol use Not on file  . Drug use: Unknown  .  Sexual activity: Not on file   Other Topics Concern  . Not on file   Social History Narrative  . No narrative on file     Review of Systems: Review of Systems  Constitutional: Positive for malaise/fatigue. Negative for chills, fever and weight loss.  HENT: Negative for ear discharge, ear pain and hearing loss.   Eyes: Negative for blurred vision and double vision.  Respiratory: Negative for cough, hemoptysis and sputum production.   Cardiovascular: Negative for chest pain, palpitations and orthopnea.  Gastrointestinal: Positive for heartburn. Negative for nausea and vomiting.  Genitourinary: Negative for dysuria, frequency and urgency.  Musculoskeletal: Negative for joint pain and myalgias.  Skin: Negative for itching and rash.  Neurological: Positive for tremors and weakness. Negative for dizziness.  Endo/Heme/Allergies: Negative for polydipsia. Does not bruise/bleed easily.  Psychiatric/Behavioral: Negative for depression and hallucinations. The patient is nervous/anxious.      Vital Signs: Blood pressure (!) 96/53, pulse 73, temperature 98.7 F (37.1 C), temperature source Oral, resp. rate 17, height 5\' 9"  (1.753 m), weight 77.1 kg (170 lb), SpO2 100 %.  Weight trends: Filed Weights   11/05/16 1855  Weight: 77.1 kg (170 lb)    Physical Exam: General: NAD, laying in bed  Head: Normocephalic, atraumatic.  Eyes: Anicteric, EOMI  Nose: Mucous membranes moist, not inflammed, nonerythematous.  Throat: Oropharynx nonerythematous, no exudate appreciated.   Neck: Supple, trachea midline.  Lungs:  Normal respiratory effort. Clear to auscultation BL without crackles or wheezes.  Heart: RRR. S1 and S2 normal without gallop, murmur, or rubs.  Abdomen:  BS normoactive. Soft, Nondistended, non-tender.  No masses or organomegaly.  Extremities: No pretibial edema.  Neurologic: Awake and alert, follows commands, has a tremor with holding a glass to drink  Skin: No visible rashes,  scars.    Lab results: Basic Metabolic Panel:  Recent Labs Lab 11/05/16 1857 11/05/16 2351 11/06/16 0358  NA 130* 132* 133*  K 3.0* 3.1* 2.7*  CL 81* 87* 90*  CO2 29 31 31   GLUCOSE 179* 141* 119*  BUN 78* 77* 79*  CREATININE 11.34* 10.16* 9.51*  CALCIUM 9.0 8.6* 8.3*  MG  --  2.3  --   PHOS  --  6.4*  --     Liver Function Tests:  Recent Labs Lab 11/05/16 1857 11/06/16 0358  AST 30 24  ALT 13* 12*  ALKPHOS 136* 104  BILITOT 1.3* 0.8  PROT 7.7 6.2*  ALBUMIN 4.8 3.7    Recent Labs Lab 11/05/16 1857  LIPASE 82*   No results for input(s): AMMONIA in the last 168 hours.  CBC:  Recent Labs Lab 11/05/16 1857 11/06/16 0358  WBC 12.7* 13.4*  NEUTROABS 11.7*  --   HGB 12.0* 10.8*  HCT 35.7* 32.1*  MCV 85.6 86.6  PLT 188 174    Cardiac Enzymes:  Recent Labs Lab 11/05/16 1857 11/06/16 0358  CKTOTAL 700* 983*  TROPONINI <0.03  --     BNP: Invalid input(s): POCBNP  CBG: No results for input(s): GLUCAP in the last 168 hours.  Microbiology: No results found for this or any previous visit.  Coagulation Studies: No results for input(s): LABPROT, INR in the last 72 hours.  Urinalysis:  Recent Labs  11/06/16 0833  COLORURINE YELLOW*  LABSPEC 1.009  PHURINE 5.0  GLUCOSEU NEGATIVE  HGBUR MODERATE*  BILIRUBINUR NEGATIVE  KETONESUR NEGATIVE  PROTEINUR NEGATIVE  NITRITE NEGATIVE  LEUKOCYTESUR NEGATIVE      Imaging: Ct Head Wo Contrast  Result Date: 11/05/2016 CLINICAL DATA:  Schizoaffective disorder with hypertension, found on floor with lethargy. EXAM: CT HEAD WITHOUT CONTRAST TECHNIQUE: Contiguous axial images were obtained from the base of the skull through the vertex without intravenous contrast. COMPARISON:  None. FINDINGS: Brain: No evidence of acute infarction, hemorrhage, hydrocephalus, extra-axial collection or mass lesion/mass effect allowing for slight motion degraded artifacts. Vascular: No hyperdense vessel or unexpected  calcification. Skull: Negative for fracture or focal lesion. Sinuses/Orbits: Intact orbits and globes. No retrobulbar abnormality. The mastoids and visualized paranasal sinuses are nonacute. Other: None IMPRESSION: No acute intracranial abnormality. Electronically Signed   By: Ashley Royalty M.D.   On: 11/05/2016 20:40   US Renal  Result Date: 11/06/2016 CLINICAL DATA:  Acute renal failure EXAM: RENAL / URINARY TRACT ULTRASOUND COMPLETE COMPARISON:  None. FINDINGS: Right Kidney: Length: 10.1 cm. Echogenicity is mildly increased. Renal cortical thickness is normal. No mass, perinephric fluid, or hydronephrosis visualized. No sonographically demonstrable calculus or ureterectasis. Left Kidney: Length: 10.4 cm. Echogenicity is mildly increased. Renal cortical thickness is normal. No mass, perinephric fluid, or hydronephrosis visualized. No sonographically demonstrable calculus or ureterectasis. Bladder: Appears normal for degree of bladder distention. IMPRESSION: Mild increase in renal echogenicity bilaterally, a finding which may be indicative of medical renal disease. Renal cortical thickness normal. No obstructing focus on either side. Study otherwise unremarkable. Electronically Signed   By: Lowella Grip III M.D.   On: 11/06/2016 07:55   Dg Chest Portable 1 View  Result Date: 11/05/2016 CLINICAL DATA:  Patient was found in his apartment with the windows closed and no AC on, incontinent to urine, slurred speech and confusion. Patient has dried mucous membranes. Patient was hypotensive upon EMs arrival at 76/41; EXAM: PORTABLE CHEST 1 VIEW COMPARISON:  12/05/2013 FINDINGS: Numerous leads and wires project over the chest. Midline trachea. Normal heart size and mediastinal contours. No pleural effusion or pneumothorax. Clear lungs. IMPRESSION: No acute cardiopulmonary disease. Electronically Signed   By: Abigail Miyamoto M.D.   On: 11/05/2016 19:29      Assessment & Plan: Pt is a 51 y.o. male with a PMHx of  Schizoaffective disorder hypertension, GERD who was admitted to [facility] on 11/05/2016 for evaluation of generalized weakness and recent falls.  1.  Acute renal failure secondary to prolonged dehydration. 2.  Mild rhabodmyolysis. 3.  Hyponatremia. 4.  Hypokalemia. 5.  Lithium toxicity, mild at the moment.   Plan:  The patient presented with very poor eating for the past 3 weeks per his report. He was however taking his medications. Suspect that he had prolonged dehydration which is lead to acute renal failure. His creatinine is coming down with gentle IV fluid hydration. He also was found have multiple electrolyte abnormalities. There is a very mild tremor noted when he is holding a glass. Given his relatively severe acute renal failure is our opinion that he will improve more quickly with temporary hemodialysis. After discussing risks, benefits, and alternatives with the patient he is agreeable to proceeding with temporary hemodialysis. I have consulted vascular surgery for this purpose. Continue IV fluid hydration with 0.9 normal saline. Agree with potassium repletion as well.   Further plan as patient progresses. Thanks for consultation.

## 2016-11-06 NOTE — Progress Notes (Signed)
Patient takes lithium carbonate capsules at home 300 mg in the morning and 600 mg at bedtime. Patient's lithium level was 2.7 and repeat was 2.5.  Contacted MD to discontinue lithium; patient will possibly be dialyzed. MD agrees with plan.  Tobie Lords, PharmD, BCPS Clinical Pharmacist 11/06/2016

## 2016-11-06 NOTE — Consult Note (Signed)
[facility] Vascular Consult Note  MRN : [id number]  Nicholas Cole is a 51 y.o. ([birth year]) male who presents with chief complaint of  Chief Complaint  Patient presents with  . Altered Mental Status  .  History of Present Illness: I am asked to evaluate the patient by Dr Holley Raring.  He is is a 51 y.o. male with a past medical history significant for schizoaffective disorder and HTN.  He was brought in to the [facility] ER yesterday by EMS after being found on the floor in his home lethargic. Pt has not been eating or drinking much but isn't able to give a reason why. In ER, pt found to be very lethargic and in ARF with rhabdomyolysis. He was also noted to be lithium toxic and is very tremulous.  After evaluation by Nephrology he it is felt that he would benefit from dialysis and I am asked to evaluate for access.   Current Facility-Administered Medications  Medication Dose Route Frequency Provider Last Rate Last Dose  . 0.9 % NaCl with KCl 20 mEq/ L  infusion   Intravenous Continuous Idelle Crouch, MD 100 mL/hr at 11/06/16 0945    . acetaminophen (TYLENOL) tablet 650 mg  650 mg Oral Q6H PRN Idelle Crouch, MD       Or  . acetaminophen (TYLENOL) suppository 650 mg  650 mg Rectal Q6H PRN Idelle Crouch, MD      . aspirin EC tablet 81 mg  81 mg Oral Daily Idelle Crouch, MD   81 mg at 11/06/16 0947  . bisacodyl (DULCOLAX) suppository 10 mg  10 mg Rectal Daily PRN Idelle Crouch, MD      . cefTRIAXone (ROCEPHIN) 1 g in dextrose 5 % 50 mL IVPB  1 g Intravenous Q24H Idelle Crouch, MD   Stopped at 11/06/16 0700  . docusate sodium (COLACE) capsule 100 mg  100 mg Oral BID Idelle Crouch, MD   100 mg at 11/06/16 0947  . famotidine (PEPCID) tablet 20 mg  20 mg Oral Daily Idelle Crouch, MD   20 mg at 11/06/16 0947  . heparin injection 5,000 Units  5,000 Units Subcutaneous Q8H Idelle Crouch, MD   5,000 Units at 11/06/16 0553  . LORazepam (ATIVAN) injection  0.5 mg  0.5 mg Intravenous Q4H PRN Idelle Crouch, MD      . ondansetron [employer] [employer]) tablet 4 mg  4 mg Oral Q6H PRN Idelle Crouch, MD       Or  . ondansetron (ZOFRAN) injection 4 mg  4 mg Intravenous Q6H PRN Idelle Crouch, MD        History reviewed. No pertinent past medical history.  History reviewed. No pertinent surgical history.  Social History Social History  Substance Use Topics  . Smoking status: Former Research scientist (life sciences)  . Smokeless tobacco: Never Used  . Alcohol use Not on file    Family History History reviewed. No pertinent family history. No family history of bleeding/clotting disorders, porphyria or autoimmune disease   No Known Allergies   REVIEW OF SYSTEMS (Negative unless checked)  Constitutional: [] Weight loss  [] Fever  [] Chills Cardiac: [] Chest pain   [] Chest pressure   [] Palpitations   [] Shortness of breath when laying flat   [x] Shortness of breath at rest   [] Shortness of breath with exertion. Vascular:  [] Pain in legs with walking   [] Pain in legs at rest   [] Pain in legs when laying flat   [] Claudication   []   Pain in feet when walking  [] Pain in feet at rest  [] Pain in feet when laying flat   [] History of DVT   [] Phlebitis   [] Swelling in legs   [] Varicose veins   [] Non-healing ulcers Pulmonary:   [] Uses home oxygen   [] Productive cough   [] Hemoptysis   [] Wheeze  [] COPD   [] Asthma Neurologic:  [] Dizziness  [] Blackouts   [] Seizures   [] History of stroke   [] History of TIA  [] Aphasia   [] Temporary blindness   [] Dysphagia   [] Weakness or numbness in arms   [] Weakness or numbness in legs Musculoskeletal:  [] Arthritis   [] Joint swelling   [] Joint pain   [] Low back pain Hematologic:  [] Easy bruising  [] Easy bleeding   [] Hypercoagulable state   [] Anemic  [] Hepatitis Gastrointestinal:  [] Blood in stool   [] Vomiting blood  [] Gastroesophageal reflux/heartburn   [] Difficulty swallowing. Genitourinary:  [x] Chronic kidney disease   [] Difficult urination  [] Frequent urination   [] Burning with urination   [] Blood in urine Skin:  [] Rashes   [] Ulcers   [] Wounds Psychological:  [x] History of schizophrenia   []  History of major depression.   Physical Examination  Vitals:   11/05/16 1855 11/05/16 2111 11/06/16 0414 11/06/16 1227  BP:  (!) 142/83 (!) 96/53 (!) 96/53  Pulse:  89 74 73  Resp:  16 18 17   Temp:  99.4 F (37.4 C) 98.4 F (36.9 C) 98.7 F (37.1 C)  TempSrc:  Oral Oral Oral  SpO2:  98% 98% 100%  Weight: 77.1 kg (170 lb)     Height: 5\' 9"  (1.753 m)      Body mass index is 25.1 kg/m.  Head: [place]/AT, No temporalis wasting. Prominent temp pulse not noted. Ear/Nose/Throat: Nares w/o erythema or drainage, oropharynx w/o obsrtuction, Mallampati score: 3.  Dentition poor.  Eyes: PERRLA, Sclera nonicteric.  Neck: Supple, no nuchal rigidity.  No bruit or JVD.  Pulmonary:  Breath sounds equal bilaterally, no use of accessory muscles.  Cardiac: RRR, normal S1, S2, no Murmurs, rubs or gallops. Gastrointestinal: soft, non-tender, non-distended.  Musculoskeletal: Moves all extremities.  No deformity or atrophy. No edema. Neurologic: CN 2-12 intact. Symmetrical.  Speech is fluent.  Psychiatric: Judgment seems poor, Mood & affect appropriate for pt's clinical situation. Dermatologic: No rashes or ulcers noted.  No cellulitis or open wounds. Lymph : No Cervical,  or Inguinal lymphadenopathy.      CBC Lab Results  Component Value Date   WBC 13.4 (H) 11/06/2016   HGB 10.8 (L) 11/06/2016   HCT 32.1 (L) 11/06/2016   MCV 86.6 11/06/2016   PLT 174 11/06/2016    BMET    Component Value Date/Time   NA 133 (L) 11/06/2016 0358   NA 144 02/06/2014 0108   K 2.7 (LL) 11/06/2016 0358   K 4.1 02/06/2014 0108   CL 90 (L) 11/06/2016 0358   CL 109 (H) 02/06/2014 0108   CO2 31 11/06/2016 0358   CO2 28 02/06/2014 0108   GLUCOSE 119 (H) 11/06/2016 0358   GLUCOSE 99 02/06/2014 0108   BUN 79 (H) 11/06/2016 0358   BUN 10 02/06/2014 0108   CREATININE 9.51 (H)  11/06/2016 0358   CREATININE 1.27 02/06/2014 0108   CALCIUM 8.3 (L) 11/06/2016 0358   CALCIUM 9.1 02/06/2014 0108   GFRNONAA 6 (L) 11/06/2016 0358   GFRNONAA >60 02/06/2014 0108   GFRAA 7 (L) 11/06/2016 0358   GFRAA >60 02/06/2014 0108   Estimated Creatinine Clearance: 9.4 mL/min (A) (by C-G formula based on SCr  of 9.51 mg/dL (H)).  COAG No results found for: INR, PROTIME  Assessment/Plan 1. Lithium toxicity. On admission his lithium level was 2.7. Today it's 2.3.   Normal range is 0-1.2. Lithium is held. Dr. Holley Raring feels dialysis would be tremendously. Helpful.  I will place a temporary catheter to allow HD to begin I would anticipate after discussion with Dr Holley Raring just a few runs. 2. Acute kidney injury on chronic kidney disease stage 2. IV fluid hydration. Nephrology is on consult. Renal ultrasound showed medical renal disease. 3. Hypokalemia. Watch closely with replacement since the patient does have acute kidney injury 4. Rhabdomyolysis probably from fall. IV fluid hydration continue to monitor CPK 5. Schizoaffective disorder. Psychiatric consultation for medication management. Likely lithium will not be a good medication at this point.  Hortencia Pilar, MD  11/06/2016 2:07 PM

## 2016-11-06 NOTE — Progress Notes (Signed)
HD COMPLETED  

## 2016-11-07 LAB — BASIC METABOLIC PANEL
Anion gap: 7 (ref 5–15)
BUN: 53 mg/dL — ABNORMAL HIGH (ref 6–20)
CALCIUM: 9.3 mg/dL (ref 8.9–10.3)
CHLORIDE: 111 mmol/L (ref 101–111)
CO2: 26 mmol/L (ref 22–32)
CREATININE: 4.84 mg/dL — AB (ref 0.61–1.24)
GFR calc Af Amer: 15 mL/min — ABNORMAL LOW (ref >60)
GFR calc non Af Amer: 13 mL/min — ABNORMAL LOW (ref >60)
Glucose, Bld: 133 mg/dL — ABNORMAL HIGH (ref 65–99)
Potassium: 3.8 mmol/L (ref 3.5–5.1)
Sodium: 144 mmol/L (ref 135–145)

## 2016-11-07 LAB — VOLATILES,BLD-ACETONE,ETHANOL,ISOPROP,METHANOL
Acetone, blood: NEGATIVE % (ref 0.000–0.010)
ETHANOL, BLOOD: NEGATIVE % (ref 0.000–0.010)
ISOPROPANOL, BLOOD: NEGATIVE % (ref 0.000–0.010)
Methanol, blood: NEGATIVE % (ref 0.000–0.010)

## 2016-11-07 LAB — CK: Total CK: 766 U/L — ABNORMAL HIGH (ref 49–397)

## 2016-11-07 LAB — LITHIUM LEVEL
LITHIUM LVL: 1 mmol/L (ref 0.60–1.20)
Lithium Lvl: 1.6 mmol/L (ref 0.60–1.20)

## 2016-11-07 LAB — HIV ANTIBODY (ROUTINE TESTING W REFLEX): HIV SCREEN 4TH GENERATION: NONREACTIVE

## 2016-11-07 MED ORDER — SODIUM CHLORIDE 0.9 % IV SOLN
INTRAVENOUS | Status: AC
  Administered 2016-11-07 – 2016-11-09 (×3): via INTRAVENOUS

## 2016-11-07 NOTE — Progress Notes (Signed)
Patient ID: Nicholas Cole, male   DOB: 06/17/1965, 51 y.o.   MRN: [id number]   [facility] PROGRESS NOTE  Nicholas Cole WUJ:[id number] DOB: 1965/11/25 DOA: 11/05/2016 PCP: No primary care provider on file.  HPI/Subjective: Patient is a poor historian. States he still had tremor today. Patient was seen while he is on dialysis and he did not have tremor at that time.  Objective: Vitals:   11/07/16 1301 11/07/16 1340  BP: 127/76 132/71  Pulse: 74 80  Resp: 16 16  Temp: 98 F (36.7 C) 97.9 F (36.6 C)    Filed Weights   11/07/16 0440 11/07/16 1025 11/07/16 1301  Weight: 72.9 kg (160 lb 11.2 oz) 74.2 kg (163 lb 9.3 oz) 74.2 kg (163 lb 9.3 oz)    ROS: Review of Systems  Constitutional: Negative for chills and fever.  Eyes: Negative for blurred vision.  Respiratory: Negative for cough and shortness of breath.   Cardiovascular: Negative for chest pain.  Gastrointestinal: Negative for abdominal pain, constipation, diarrhea, nausea and vomiting.  Genitourinary: Negative for dysuria.  Musculoskeletal: Negative for joint pain.  Neurological: Positive for tremors and weakness. Negative for dizziness and headaches.   Exam: Physical Exam  HENT:  Nose: No mucosal edema.  Mouth/Throat: No oropharyngeal exudate or posterior oropharyngeal edema.  Eyes: Conjunctivae, EOM and lids are normal. Pupils are equal, round, and reactive to light.  Neck: No JVD present. Carotid bruit is not present. No edema present. No thyroid mass and no thyromegaly present.  Cardiovascular: S1 normal and S2 normal.  Exam reveals no gallop.   No murmur heard. Pulses:      Dorsalis pedis pulses are 2+ on the right side, and 2+ on the left side.  Respiratory: No respiratory distress. He has no wheezes. He has no rhonchi. He has no rales.  GI: Soft. Bowel sounds are normal. There is no tenderness.  Musculoskeletal:       Right ankle: He exhibits no swelling.       Left ankle: He exhibits no swelling.   Lymphadenopathy:    He has no cervical adenopathy.  Neurological: He is alert. He displays no tremor. No cranial nerve deficit.  Skin: Skin is warm. No rash noted. Nails show no clubbing.  Psychiatric: He has a normal mood and affect.      Data Reviewed: Basic Metabolic Panel:  Recent Labs Lab 11/05/16 1857 11/05/16 2351 11/06/16 0358 11/06/16 1635 11/07/16 0312  NA 130* 132* 133*  --  144  K 3.0* 3.1* 2.7*  --  3.8  CL 81* 87* 90*  --  111  CO2 29 31 31   --  26  GLUCOSE 179* 141* 119*  --  133*  BUN 78* 77* 79*  --  53*  CREATININE 11.34* 10.16* 9.51*  --  4.84*  CALCIUM 9.0 8.6* 8.3*  --  9.3  MG  --  2.3  --   --   --   PHOS  --  6.4*  --  2.3*  --    Liver Function Tests:  Recent Labs Lab 11/05/16 1857 11/06/16 0358  AST 30 24  ALT 13* 12*  ALKPHOS 136* 104  BILITOT 1.3* 0.8  PROT 7.7 6.2*  ALBUMIN 4.8 3.7    Recent Labs Lab 11/05/16 1857  LIPASE 82*   CBC:  Recent Labs Lab 11/05/16 1857 11/06/16 0358  WBC 12.7* 13.4*  NEUTROABS 11.7*  --   HGB 12.0* 10.8*  HCT 35.7* 32.1*  MCV 85.6 86.6  PLT 188 174   Cardiac Enzymes:  Recent Labs Lab 11/05/16 1857 11/06/16 0358 11/07/16 0312  CKTOTAL 700* 983* 766*  TROPONINI <0.03  --   --      Studies: Ct Head Wo Contrast  Result Date: 11/05/2016 CLINICAL DATA:  Schizoaffective disorder with hypertension, found on floor with lethargy. EXAM: CT HEAD WITHOUT CONTRAST TECHNIQUE: Contiguous axial images were obtained from the base of the skull through the vertex without intravenous contrast. COMPARISON:  None. FINDINGS: Brain: No evidence of acute infarction, hemorrhage, hydrocephalus, extra-axial collection or mass lesion/mass effect allowing for slight motion degraded artifacts. Vascular: No hyperdense vessel or unexpected calcification. Skull: Negative for fracture or focal lesion. Sinuses/Orbits: Intact orbits and globes. No retrobulbar abnormality. The mastoids and visualized paranasal sinuses are  nonacute. Other: None IMPRESSION: No acute intracranial abnormality. Electronically Signed   By: Ashley Royalty M.D.   On: 11/05/2016 20:40   US Renal  Result Date: 11/06/2016 CLINICAL DATA:  Acute renal failure EXAM: RENAL / URINARY TRACT ULTRASOUND COMPLETE COMPARISON:  None. FINDINGS: Right Kidney: Length: 10.1 cm. Echogenicity is mildly increased. Renal cortical thickness is normal. No mass, perinephric fluid, or hydronephrosis visualized. No sonographically demonstrable calculus or ureterectasis. Left Kidney: Length: 10.4 cm. Echogenicity is mildly increased. Renal cortical thickness is normal. No mass, perinephric fluid, or hydronephrosis visualized. No sonographically demonstrable calculus or ureterectasis. Bladder: Appears normal for degree of bladder distention. IMPRESSION: Mild increase in renal echogenicity bilaterally, a finding which may be indicative of medical renal disease. Renal cortical thickness normal. No obstructing focus on either side. Study otherwise unremarkable. Electronically Signed   By: Lowella Grip III M.D.   On: 11/06/2016 07:55   Dg Chest Portable 1 View  Result Date: 11/05/2016 CLINICAL DATA:  Patient was found in his apartment with the windows closed and no AC on, incontinent to urine, slurred speech and confusion. Patient has dried mucous membranes. Patient was hypotensive upon EMs arrival at 76/41; EXAM: PORTABLE CHEST 1 VIEW COMPARISON:  12/05/2013 FINDINGS: Numerous leads and wires project over the chest. Midline trachea. Normal heart size and mediastinal contours. No pleural effusion or pneumothorax. Clear lungs. IMPRESSION: No acute cardiopulmonary disease. Electronically Signed   By: Abigail Miyamoto M.D.   On: 11/05/2016 19:29    Scheduled Meds: . aspirin EC  81 mg Oral Daily  . cloZAPine  300 mg Oral QHS  . docusate sodium  100 mg Oral BID  . famotidine  20 mg Oral Daily  . heparin  5,000 Units Subcutaneous Q8H  . perphenazine  2 mg Oral QHS   Continuous  Infusions: . sodium chloride 50 mL/hr at 11/07/16 0954  . cefTRIAXone (ROCEPHIN)  IV Stopped (11/06/16 2132)    Assessment/Plan:  1. Lithium toxicity. On admission with hemoglobin level was 2.7. Today it's 1.6. Normal range is 0-1.2. Lithium of course was held. Case discussed with nephrology Dr. Rolly Salter. Hopefully today will be the last dialysis session. Receive dialysis yesterday and today. Continue to monitor her lithium levels. 2. Acute kidney injury on chronic kidney disease stage 2. IV fluid hydration. Nephrology consultation. Renal ultrasound showed medical renal disease. Creatinine still very impaired. 3. Hypokalemia.  Replaced. 4. Rhabdomyolysis probably from fall. IV fluid hydration continue to monitor CPK.  5. Schizoaffective disorder. Psychiatric consultation for medication management. Appreciate psychiatric consultation. Patient currently on clozapine and perphenazine. 6. Discontinue antibiotics  Code Status:     Code Status Orders        Start     Ordered  11/05/16 2206  Full code  Continuous     11/05/16 2205    Code Status History    Date Active Date Inactive Code Status Order ID Comments User Context   This patient has a current code status but no historical code status.    Advance Directive Documentation     Most Recent Value  Type of Advance Directive  Healthcare Power of Attorney  Pre-existing out of facility DNR order (yellow form or pink MOST form)  -  "MOST" Form in Place?  -      Disposition Plan: Creatinine must improve prior to disposition  Consultants:  Nephrology  Psychiatry  Time spent: 25 minutes  [place], [place]

## 2016-11-07 NOTE — Progress Notes (Signed)
PT Cancellation Note  Patient Details Name: Nicholas Cole MRN: [id number] DOB: 07/25/1965   Cancelled Treatment:    Reason Eval/Treat Not Completed: Patient at procedure or test/unavailable (Evaluation attempted. Patient currently off unit for dialysis; will re-attempt at later time/date as medically appropriate and available.  )   Of note, patient with R temp fem dialysis catheter (placed 6/10); will need to remain bed-level until removed.   Odai Wimmer H. Owens Shark, PT, DPT, NCS 11/07/16, 11:04 AM [phone]

## 2016-11-07 NOTE — Progress Notes (Signed)
[facility]  ROUNDING NOTE   Subjective:   Seen and examined on hemodialysis. Tolerating treatment well. Treatment today due to continuing tremors  Lithium level improved.   Objective:  Vital signs in last 24 hours:  Temp:  [98 F (36.7 C)-99 F (37.2 C)] 98 F (36.7 C) (06/11 1301) Pulse Rate:  [71-98] 74 (06/11 1301) Resp:  [16-20] 16 (06/11 1301) BP: (110-127)/(70-88) 127/76 (06/11 1301) SpO2:  [98 %-100 %] 100 % (06/11 1301) Weight:  [72.9 kg (160 lb 11.2 oz)-75.1 kg (165 lb 9.1 oz)] 74.2 kg (163 lb 9.3 oz) (06/11 1301)  Weight change: -2.011 kg (-4 lb 7 oz) Filed Weights   11/07/16 0440 11/07/16 1025 11/07/16 1301  Weight: 72.9 kg (160 lb 11.2 oz) 74.2 kg (163 lb 9.3 oz) 74.2 kg (163 lb 9.3 oz)    Intake/Output: I/O last 3 completed shifts: In: 4751.7 [P.O.:840; I.V.:2761.7; IV Piggyback:1150] Out: 7169 [Urine:1840]   Intake/Output this shift:  Total I/O In: -  Out: 300 [Urine:300]  Physical Exam: General: NAD,   Head: Normocephalic, atraumatic. Moist oral mucosal membranes  Eyes: Anicteric, PERRL  Neck: Supple, trachea midline  Lungs:  Clear to auscultation  Heart: Regular rate and rhythm  Abdomen:  Soft, nontender,   Extremities: no peripheral edema.  Neurologic: Nonfocal, moving all four extremities  Skin: No lesions  Access: Right femoral temp HD catheter 6/10 Dr. Delana Meyer    Basic Metabolic Panel:  Recent Labs Lab 11/05/16 1857 11/05/16 2351 11/06/16 0358 11/06/16 1635 11/07/16 0312  NA 130* 132* 133*  --  144  K 3.0* 3.1* 2.7*  --  3.8  CL 81* 87* 90*  --  111  CO2 29 31 31   --  26  GLUCOSE 179* 141* 119*  --  133*  BUN 78* 77* 79*  --  53*  CREATININE 11.34* 10.16* 9.51*  --  4.84*  CALCIUM 9.0 8.6* 8.3*  --  9.3  MG  --  2.3  --   --   --   PHOS  --  6.4*  --  2.3*  --     Liver Function Tests:  Recent Labs Lab 11/05/16 1857 11/06/16 0358  AST 30 24  ALT 13* 12*  ALKPHOS 136* 104  BILITOT 1.3* 0.8  PROT 7.7  6.2*  ALBUMIN 4.8 3.7    Recent Labs Lab 11/05/16 1857  LIPASE 82*   No results for input(s): AMMONIA in the last 168 hours.  CBC:  Recent Labs Lab 11/05/16 1857 11/06/16 0358  WBC 12.7* 13.4*  NEUTROABS 11.7*  --   HGB 12.0* 10.8*  HCT 35.7* 32.1*  MCV 85.6 86.6  PLT 188 174    Cardiac Enzymes:  Recent Labs Lab 11/05/16 1857 11/06/16 0358 11/07/16 0312  CKTOTAL 700* 983* 766*  TROPONINI <0.03  --   --     BNP: Invalid input(s): POCBNP  CBG: No results for input(s): GLUCAP in the last 168 hours.  Microbiology: No results found for this or any previous visit.  Coagulation Studies: No results for input(s): LABPROT, INR in the last 72 hours.  Urinalysis:  Recent Labs  11/06/16 0833  COLORURINE YELLOW*  LABSPEC 1.009  PHURINE 5.0  GLUCOSEU NEGATIVE  HGBUR MODERATE*  BILIRUBINUR NEGATIVE  KETONESUR NEGATIVE  PROTEINUR NEGATIVE  NITRITE NEGATIVE  LEUKOCYTESUR NEGATIVE      Imaging: Ct Head Wo Contrast  Result Date: 11/05/2016 CLINICAL DATA:  Schizoaffective disorder with hypertension, found on floor with lethargy. EXAM: CT HEAD WITHOUT  CONTRAST TECHNIQUE: Contiguous axial images were obtained from the base of the skull through the vertex without intravenous contrast. COMPARISON:  None. FINDINGS: Brain: No evidence of acute infarction, hemorrhage, hydrocephalus, extra-axial collection or mass lesion/mass effect allowing for slight motion degraded artifacts. Vascular: No hyperdense vessel or unexpected calcification. Skull: Negative for fracture or focal lesion. Sinuses/Orbits: Intact orbits and globes. No retrobulbar abnormality. The mastoids and visualized paranasal sinuses are nonacute. Other: None IMPRESSION: No acute intracranial abnormality. Electronically Signed   By: Ashley Royalty M.D.   On: 11/05/2016 20:40   US Renal  Result Date: 11/06/2016 CLINICAL DATA:  Acute renal failure EXAM: RENAL / URINARY TRACT ULTRASOUND COMPLETE COMPARISON:  None.  FINDINGS: Right Kidney: Length: 10.1 cm. Echogenicity is mildly increased. Renal cortical thickness is normal. No mass, perinephric fluid, or hydronephrosis visualized. No sonographically demonstrable calculus or ureterectasis. Left Kidney: Length: 10.4 cm. Echogenicity is mildly increased. Renal cortical thickness is normal. No mass, perinephric fluid, or hydronephrosis visualized. No sonographically demonstrable calculus or ureterectasis. Bladder: Appears normal for degree of bladder distention. IMPRESSION: Mild increase in renal echogenicity bilaterally, a finding which may be indicative of medical renal disease. Renal cortical thickness normal. No obstructing focus on either side. Study otherwise unremarkable. Electronically Signed   By: Lowella Grip III M.D.   On: 11/06/2016 07:55   Dg Chest Portable 1 View  Result Date: 11/05/2016 CLINICAL DATA:  Patient was found in his apartment with the windows closed and no AC on, incontinent to urine, slurred speech and confusion. Patient has dried mucous membranes. Patient was hypotensive upon EMs arrival at 76/41; EXAM: PORTABLE CHEST 1 VIEW COMPARISON:  12/05/2013 FINDINGS: Numerous leads and wires project over the chest. Midline trachea. Normal heart size and mediastinal contours. No pleural effusion or pneumothorax. Clear lungs. IMPRESSION: No acute cardiopulmonary disease. Electronically Signed   By: Abigail Miyamoto M.D.   On: 11/05/2016 19:29     Medications:   . sodium chloride 50 mL/hr at 11/07/16 0954  . cefTRIAXone (ROCEPHIN)  IV Stopped (11/06/16 2132)   . aspirin EC  81 mg Oral Daily  . cloZAPine  300 mg Oral QHS  . docusate sodium  100 mg Oral BID  . famotidine  20 mg Oral Daily  . heparin  5,000 Units Subcutaneous Q8H  . perphenazine  2 mg Oral QHS   acetaminophen **OR** acetaminophen, bisacodyl, LORazepam, ondansetron **OR** ondansetron (ZOFRAN) IV  Assessment/ Plan:  [name] is a 51 y.o. black male with Schizoaffective  disorder hypertension, GERD who was admitted to [facility] on 11/05/2016   1.  Acute renal failure secondary to prolonged dehydration and lithium exposure. 2.  Rhabodmyolysis. 3.  Hyponatremia. 4.  Hypokalemia. 5.  Lithium toxicity   Plan:  - Two hemodialysis treatments.  - Check neuro status and lithium levels.  - Continue IV fluids NS at 62mL/hr for one more day.  - Appreciate psych input.    LOS: [place], [place] 6/11/20181:13 PM

## 2016-11-07 NOTE — Care Management (Signed)
Admitted from home.  Found down for indeterminate amount of time. Requiring temporary dialysis for Acute renal failure from dehydration. PT consult pending but contraindicated due to temporary dialysis in groin.  Contacted by Lars Masson with ACT Team.  Patient referred To ACT from [facility] services October 28 2016.  Patient has had a pretty recent discharge from a group home into the community.  ACT Team saw patient 6/8 and found that he "was a little off" but agency is not familiar with patient as services are relatively new.  Patient has air conditioning in the hone but "is cold natured" so does not use it.  Meds are bubble packed and up to 6/8, he was compliant with administration.  Has food in the home. Colletta [place] said the people that may have called to initiate a wellness check in the home may have been friends from his group home.  Patient is not avaialbe at present to speak with CM

## 2016-11-07 NOTE — Progress Notes (Signed)
Called Dr. Estanislado Pandy regarding the appearance of seizure like activity or tremors.  Doctor ordered lithium level to be checked by lab.  Will continue to monitor patient for any abnormal behavior.  Nicholas Cole  11/07/2016  2:42 AM

## 2016-11-08 LAB — BASIC METABOLIC PANEL
Anion gap: 4 — ABNORMAL LOW (ref 5–15)
BUN: 26 mg/dL — AB (ref 6–20)
CALCIUM: 9 mg/dL (ref 8.9–10.3)
CO2: 31 mmol/L (ref 22–32)
CREATININE: 2.52 mg/dL — AB (ref 0.61–1.24)
Chloride: 106 mmol/L (ref 101–111)
GFR calc Af Amer: 33 mL/min — ABNORMAL LOW (ref >60)
GFR, EST NON AFRICAN AMERICAN: 28 mL/min — AB (ref >60)
GLUCOSE: 103 mg/dL — AB (ref 65–99)
POTASSIUM: 4.2 mmol/L (ref 3.5–5.1)
SODIUM: 141 mmol/L (ref 135–145)

## 2016-11-08 LAB — TSH: TSH: 1.375 u[IU]/mL (ref 0.350–4.500)

## 2016-11-08 LAB — HEPATITIS B SURFACE ANTIBODY, QUANTITATIVE

## 2016-11-08 LAB — PARATHYROID HORMONE, INTACT (NO CA): PTH: 203 pg/mL — ABNORMAL HIGH (ref 15–65)

## 2016-11-08 LAB — CK: CK TOTAL: 326 U/L (ref 49–397)

## 2016-11-08 LAB — LITHIUM LEVEL: LITHIUM LVL: 1.1 mmol/L (ref 0.60–1.20)

## 2016-11-08 LAB — HEPATITIS B CORE ANTIBODY, TOTAL: HEP B C TOTAL AB: POSITIVE — AB

## 2016-11-08 LAB — HEPATITIS B SURFACE ANTIGEN: Hepatitis B Surface Ag: NEGATIVE

## 2016-11-08 MED ORDER — PERPHENAZINE 2 MG PO TABS
4.0000 mg | ORAL_TABLET | Freq: Every day | ORAL | Status: DC
  Administered 2016-11-08 – 2016-11-10 (×3): 4 mg via ORAL
  Filled 2016-11-08 (×3): qty 2

## 2016-11-08 MED ORDER — CLONAZEPAM 0.5 MG PO TABS
0.5000 mg | ORAL_TABLET | Freq: Three times a day (TID) | ORAL | Status: DC
  Administered 2016-11-08 – 2016-11-11 (×9): 0.5 mg via ORAL
  Filled 2016-11-08 (×10): qty 1

## 2016-11-08 NOTE — Progress Notes (Signed)
Patient ID: Nicholas Cole, male   DOB: [birth year], 51 y.o.   MRN: [id number]    [facility] PROGRESS NOTE  Nicholas Cole ZES:[id number] DOB: 03-27-1966 DOA: 11/05/2016 PCP: Lorelee Market, MD  HPI/Subjective: Patient is a poor historian. States he still had tremor today. Patient asking me to contact Olivia Mackie but I do not see a phone number.   Objective: Vitals:   11/08/16 0455 11/08/16 1348  BP: 124/70 (!) 145/94  Pulse: 87 100  Resp:  18  Temp: 98.5 F (36.9 C) 98.2 F (36.8 C)    Filed Weights   11/07/16 1025 11/07/16 1301 11/08/16 0500  Weight: 74.2 kg (163 lb 9.3 oz) 74.2 kg (163 lb 9.3 oz) 74.6 kg (164 lb 7.4 oz)    ROS: Review of Systems  Constitutional: Negative for chills and fever.  Eyes: Negative for blurred vision.  Respiratory: Negative for cough and shortness of breath.   Cardiovascular: Negative for chest pain.  Gastrointestinal: Negative for abdominal pain, constipation, diarrhea, nausea and vomiting.  Genitourinary: Negative for dysuria.  Musculoskeletal: Negative for joint pain.  Neurological: Positive for tremors and weakness. Negative for dizziness and headaches.   Exam: Physical Exam  HENT:  Nose: No mucosal edema.  Mouth/Throat: No oropharyngeal exudate or posterior oropharyngeal edema.  Eyes: Conjunctivae, EOM and lids are normal. Pupils are equal, round, and reactive to light.  Neck: No JVD present. Carotid bruit is not present. No edema present. No thyroid mass and no thyromegaly present.  Cardiovascular: S1 normal and S2 normal.  Exam reveals no gallop.   No murmur heard. Pulses:      Dorsalis pedis pulses are 2+ on the right side, and 2+ on the left side.  Respiratory: No respiratory distress. He has no wheezes. He has no rhonchi. He has no rales.  GI: Soft. Bowel sounds are normal. There is no tenderness.  Musculoskeletal:       Right ankle: He exhibits no swelling.       Left ankle: He exhibits no swelling.  Lymphadenopathy:    He  has no cervical adenopathy.  Neurological: He is alert. He displays no tremor. No cranial nerve deficit.  Skin: Skin is warm. No rash noted. Nails show no clubbing.  Psychiatric: He has a normal mood and affect.      Data Reviewed: Basic Metabolic Panel:  Recent Labs Lab 11/05/16 1857 11/05/16 2351 11/06/16 0358 11/06/16 1635 11/07/16 0312 11/08/16 0442  NA 130* 132* 133*  --  144 141  K 3.0* 3.1* 2.7*  --  3.8 4.2  CL 81* 87* 90*  --  111 106  CO2 29 31 31   --  26 31  GLUCOSE 179* 141* 119*  --  133* 103*  BUN 78* 77* 79*  --  53* 26*  CREATININE 11.34* 10.16* 9.51*  --  4.84* 2.52*  CALCIUM 9.0 8.6* 8.3*  --  9.3 9.0  MG  --  2.3  --   --   --   --   PHOS  --  6.4*  --  2.3*  --   --    Liver Function Tests:  Recent Labs Lab 11/05/16 1857 11/06/16 0358  AST 30 24  ALT 13* 12*  ALKPHOS 136* 104  BILITOT 1.3* 0.8  PROT 7.7 6.2*  ALBUMIN 4.8 3.7    Recent Labs Lab 11/05/16 1857  LIPASE 82*   CBC:  Recent Labs Lab 11/05/16 1857 11/06/16 0358  WBC 12.7* 13.4*  NEUTROABS 11.7*  --  HGB 12.0* 10.8*  HCT 35.7* 32.1*  MCV 85.6 86.6  PLT 188 174   Cardiac Enzymes:  Recent Labs Lab 11/05/16 1857 11/06/16 0358 11/07/16 0312 11/08/16 0442  CKTOTAL 700* 983* 766* 326  TROPONINI <0.03  --   --   --      Studies: No results found.  Scheduled Meds: . aspirin EC  81 mg Oral Daily  . cloZAPine  300 mg Oral QHS  . docusate sodium  100 mg Oral BID  . famotidine  20 mg Oral Daily  . heparin  5,000 Units Subcutaneous Q8H  . perphenazine  2 mg Oral QHS   Continuous Infusions: . sodium chloride 50 mL/hr at 11/08/16 0559    Assessment/Plan:  1. Lithium toxicity. On admission with hemoglobin level was 2.7. Today it's 1.1. Normal range is 0-1.2. Lithium is held. Case discussed with nephrology Dr. Rolly Salter. Patient had 2 dialysis sessions. Holding dialysis at this point Continue to monitor lithium levels. 2. Acute kidney injury on chronic kidney  disease stage 2. IV fluid hydration. Nephrology consultation. Renal ultrasound showed medical renal disease. Creatinine improved after dialysis. Monitor with holding dialysis. 3. Hypokalemia.  Replaced. 4. Rhabdomyolysis probably from fall. CPK improved with IV fluids  5. Schizoaffective disorder. Psychiatric consultation for medication management. Appreciate psychiatric consultation. Patient currently on clozapine and perphenazine.  Code Status:     Code Status Orders        Start     Ordered   11/05/16 2206  Full code  Continuous     11/05/16 2205    Code Status History    Date Active Date Inactive Code Status Order ID Comments User Context   This patient has a current code status but no historical code status.    Advance Directive Documentation     Most Recent Value  Type of Advance Directive  Healthcare Power of [place]  Pre-existing out of facility DNR order (yellow form or pink MOST form)  -  "MOST" Form in Place?  -      Disposition Plan: Creatinine must improve prior to disposition  Consultants:  Nephrology  Psychiatry  Time spent: 24 minutes  [place], [place]

## 2016-11-08 NOTE — Progress Notes (Signed)
Patient A/OX3, continues to be confused regarding situation that brought him to the hospital; incontinent of urine; stressed to patient the importance of using the urinal for output; bathed at this time; urinal within reach; voiced understanding; Barbaraann Faster, RN 12:58 AM 11/08/2016

## 2016-11-08 NOTE — Evaluation (Signed)
Physical Therapy Evaluation Patient Details Name: Nicholas Cole MRN: [id number] DOB: 1965/10/23 Today's Date: 11/08/2016   History of Present Illness  51 y.o. male has a past medical history significant for schizoaffective d/o and HTN brought in by EMS after being found on the floor in his home lethargic. Pt has not been eating or drinking well but cannot articulate how long he was lying on the floor. In ER, pt found to be in ARF with rhabdomyolysis.  Had temporary dialysis cath placed and numbers have been getting better.   Clinical Impression  Pt pleasant and willing to participate with PT, but ultimately needed a lot of cuing to remain on task; out of bed mobility/walking/etc could not be formally tested secondary to pt having temporary femoral catheter.  He was somewhat impulsive and perseverated on not wanting to be here, and per mental status he may need more care than her currently has.  Difficult to make full recommendations with limited PT exam, but he did seem to have good control/confidence with physical/mobility tasks.  Formal recommendations TBD.    Follow Up Recommendations Supervision/Assistance - 24 hour (PT recommendations per progress, after full assessment)    Equipment Recommendations       Recommendations for Other Services       Precautions / Restrictions Precautions Precautions: Fall (temporary cath) Restrictions Weight Bearing Restrictions: No Other Position/Activity Restrictions: bed activities only secondary to temp cath      Mobility  Bed Mobility Overal bed mobility: Independent                Transfers Overall transfer level: Modified independent               General transfer comment: Pt was able to stand at EOB, deferred doing anything more secondary to fem cath, pt did have some unsteadiness at EOB, leaning back onto bed  Ambulation/Gait             General Gait Details: deferred  Stairs            Wheelchair Mobility    Modified Rankin (Stroke Patients Only)       Balance Overall balance assessment: Needs assistance   Sitting balance-Leahy Scale: Normal       Standing balance-Leahy Scale: Fair Standing balance comment: pt leaning back on bed, not formally tested secondary to fem cath                             Pertinent Vitals/Pain Pain Assessment: No/denies pain    Home Living Family/patient expects to be discharged to:: Private residence Living Arrangements: Alone Available Help at Discharge: Friend(s) (ACT assistance)             Additional Comments: recently out of group home    Prior Function Level of Independence: Needs assistance         Comments: pt does not drive, has assistance from ACT, reports he is fine in the home and able to do all he needs     Hand Dominance        Extremity/Trunk Assessment   Upper Extremity Assessment Upper Extremity Assessment: Overall WFL for tasks assessed    Lower Extremity Assessment Lower Extremity Assessment: RLE deficits/detail (minimal R LE testing/tolerance, otherwise appears [facility]) RLE Deficits / Details: deferred some R LE testing secondary to temporary cathater.       Communication   Communication:  (Pt with scattered, pre-occupied speech)  Cognition Arousal/Alertness: Awake/alert  Behavior During Therapy: Anxious;Impulsive Overall Cognitive Status: Difficult to assess                                 General Comments: Pt with psych dx, unsure how current mentation compares to baseline but pt was difficult to keep on task      General Comments      Exercises     Assessment/Plan    PT Assessment Patient needs continued PT services  PT Problem List Decreased strength;Decreased range of motion;Decreased knowledge of use of DME;Decreased safety awareness;Decreased balance;Decreased activity tolerance;Decreased mobility;Decreased cognition;Decreased coordination       PT Treatment  Interventions Gait training;Stair training;Functional mobility training;Therapeutic activities;Therapeutic exercise;Balance training;Neuromuscular re-education;Patient/family education;Cognitive remediation    PT Goals (Current goals can be found in the Care Plan section)  Acute Rehab PT Goals Patient Stated Goal: go home PT Goal Formulation: With patient Time For Goal Achievement: 11/22/16 Potential to Achieve Goals: Fair    Frequency Min 2X/week   Barriers to discharge        Co-evaluation               AM-PAC PT "6 Clicks" Daily Activity  Outcome Measure Difficulty turning over in bed (including adjusting bedclothes, sheets and blankets)?: None Difficulty moving from lying on back to sitting on the side of the bed? : None Difficulty sitting down on and standing up from a chair with arms (e.g., wheelchair, bedside commode, etc,.)?: None Help needed moving to and from a bed to chair (including a wheelchair)?: None Help needed walking in hospital room?: A Little Help needed climbing 3-5 steps with a railing? : A Little 6 Click Score: 22    End of Session   Activity Tolerance: Patient tolerated treatment well Patient left: with call bell/phone within reach;with bed alarm set Nurse Communication: Mobility status PT Visit Diagnosis: Muscle weakness (generalized) (M62.81);Difficulty in walking, not elsewhere classified (R26.2)    Time: 4920-1007 PT Time Calculation (min) (ACUTE ONLY): 17 min   Charges:   PT Evaluation $PT Eval Low Complexity: 1 Procedure     PT G Codes:        Kreg Shropshire, DPT 11/08/2016, 11:07 AM

## 2016-11-08 NOTE — Consult Note (Signed)
[facility] Consult   Reason for Consult:  This is a follow-up consult for this 51 year old man with a history of schizophrenia who is currently in the hospital recovering from lithium toxicity. Referring Physician:  Leslye Peer Patient Identification: Nicholas Cole MRN:  [id number] Principal Diagnosis: ARF (acute renal failure) [facility]) Diagnosis:   Patient Active Problem List   Diagnosis Date Noted  . Lithium toxicity [T56.891A] 11/06/2016  . ARF (acute renal failure) ([place]) [N17.9] 11/05/2016  . Dehydration [E86.0] 11/05/2016  . Rhabdomyolysis [M62.82] 11/05/2016  . Schizoaffective disorder, bipolar type ([place]) [F25.0] 11/05/2016    Total Time spent with patient: 30 minutes  Subjective:   Nicholas Cole is a 51 y.o. male patient admitted with "I fell down at home".  HPI:  Patient seen chart reviewed including current notes, other psych consult from the weekend, past psychiatric notes. This is a 51 year old man with a history of schizophrenia or schizoaffective disorder who has a long history of somewhat difficult to manage psychosis and agitation. He presented to the hospital with lithium toxicity in kidney failure with very high lithium levels. Patient has received dialysis a couple of times and had serial checks on his lithium level which is now down to 1.1 with his kidney function improving. His mental status today he is wide awake and a little bit hyperactive and labile. He is somewhat agitated or at least a little hyperverbal. Didn't make any threats but gets derailed pretty easily and has a little paranoia to his thinking. His understanding of the details of his medical condition are shaky at best. I spoke with case management as well. Patient is currently living independently  Currently living independently apparently he's been there for only a short time maybe a few months. He has a brand-new act team because he switched care recently so they are not as familiar with him.  Family does not appear to be involved.  Does not drink or abuse drugs  Patient appears to be recovering kidney function rapidly. Creatinine is down to under 3 today after being as high as 11 when he presented.  Past Psychiatric History: Patient has a history of agitation and aggression and difficult to control behavior and violence when he is psychotic. When the past we have seen him even when he was on full medicines with behaviors that required seclusion and hospitalization. Spoke today with care management. It's not clear how well he is going to tolerate living independently at all given his past history. No history of suicide been on clozapine and lithium probably for a couple of years at least on a regular basis clozapine on and off for a long time same with the lithium  Risk to Self: Is patient at risk for suicide?: No Risk to Others:   Prior Inpatient Therapy:   Prior Outpatient Therapy:    Past Medical History: History reviewed. No pertinent past medical history. History reviewed. No pertinent surgical history. Family History: History reviewed. No pertinent family history. Family Psychiatric  History: None known Social History:  History  Alcohol use Not on file     History  Drug use: Unknown    Social History   Social History  . Marital status: Single    Spouse name: N/A  . Number of children: N/A  . Years of education: N/A   Social History Main Topics  . Smoking status: Former Research scientist (life sciences)  . Smokeless tobacco: Never Used  . Alcohol use None  . Drug use: Unknown  . Sexual activity: Not Asked  Other Topics Concern  . None   Social History Narrative  . None   Additional Social History:    Allergies:  No Known Allergies  Labs:  Results for orders placed or performed during the hospital encounter of 11/05/16 (from the past 48 hour(s))  Basic metabolic panel     Status: Abnormal   Collection Time: 11/07/16  3:12 AM  Result Value Ref Range   Sodium 144 135 - 145  mmol/L   Potassium 3.8 3.5 - 5.1 mmol/L   Chloride 111 101 - 111 mmol/L   CO2 26 22 - 32 mmol/L   Glucose, Bld 133 (H) 65 - 99 mg/dL   BUN 53 (H) 6 - 20 mg/dL   Creatinine, Ser 4.84 (H) 0.61 - 1.24 mg/dL   Calcium 9.3 8.9 - 10.3 mg/dL   GFR calc non Af Amer 13 (L) >60 mL/min   GFR calc Af Amer 15 (L) >60 mL/min    Comment: (NOTE) The eGFR has been calculated using the CKD EPI equation. This calculation has not been validated in all clinical situations. eGFR's persistently <60 mL/min signify possible Chronic Kidney Disease.    Anion gap 7 5 - 15  CK     Status: Abnormal   Collection Time: 11/07/16  3:12 AM  Result Value Ref Range   Total CK 766 (H) 49 - 397 U/L  Lithium level     Status: Abnormal   Collection Time: 11/07/16  3:12 AM  Result Value Ref Range   Lithium Lvl 1.60 (HH) 0.60 - 1.20 mmol/L    Comment: CRITICAL RESULT CALLED TO, READ BACK BY AND VERIFIED WITH DAWN SONGSTER AT 9323 11/07/16.PMH  Lithium level     Status: None   Collection Time: 11/07/16  5:13 PM  Result Value Ref Range   Lithium Lvl 1.00 0.60 - 1.20 mmol/L  Lithium level     Status: None   Collection Time: 11/08/16  4:42 AM  Result Value Ref Range   Lithium Lvl 1.10 0.60 - 1.20 mmol/L  TSH     Status: None   Collection Time: 11/08/16  4:42 AM  Result Value Ref Range   TSH 1.375 0.350 - 4.500 uIU/mL    Comment: Performed by a 3rd Generation assay with a functional sensitivity of <=0.01 uIU/mL.  CK     Status: None   Collection Time: 11/08/16  4:42 AM  Result Value Ref Range   Total CK 326 49 - 397 U/L  Basic metabolic panel     Status: Abnormal   Collection Time: 11/08/16  4:42 AM  Result Value Ref Range   Sodium 141 135 - 145 mmol/L   Potassium 4.2 3.5 - 5.1 mmol/L   Chloride 106 101 - 111 mmol/L   CO2 31 22 - 32 mmol/L   Glucose, Bld 103 (H) 65 - 99 mg/dL   BUN 26 (H) 6 - 20 mg/dL   Creatinine, Ser 2.52 (H) 0.61 - 1.24 mg/dL   Calcium 9.0 8.9 - 10.3 mg/dL   GFR calc non Af Amer 28 (L) >60  mL/min   GFR calc Af Amer 33 (L) >60 mL/min    Comment: (NOTE) The eGFR has been calculated using the CKD EPI equation. This calculation has not been validated in all clinical situations. eGFR's persistently <60 mL/min signify possible Chronic Kidney Disease.    Anion gap 4 (L) 5 - 15    Current Facility-Administered Medications  Medication Dose Route Frequency Provider Last Rate Last Dose  .  0.9 %  sodium chloride infusion   Intravenous Continuous Kolluru, Sarath, MD 50 mL/hr at 11/08/16 0559    . acetaminophen (TYLENOL) tablet 650 mg  650 mg Oral Q6H PRN Idelle Crouch, MD   650 mg at 11/08/16 1114   Or  . acetaminophen (TYLENOL) suppository 650 mg  650 mg Rectal Q6H PRN Idelle Crouch, MD      . aspirin EC tablet 81 mg  81 mg Oral Daily Idelle Crouch, MD   81 mg at 11/08/16 1011  . bisacodyl (DULCOLAX) suppository 10 mg  10 mg Rectal Daily PRN Idelle Crouch, MD      . clonazePAM Bobbye [place]) tablet 0.5 mg  0.5 mg Oral TID Trevonne Nyland T, MD      . cloZAPine (CLOZARIL) tablet 300 mg  300 mg Oral QHS Pucilowska, Jolanta B, MD   300 mg at 11/07/16 2246  . docusate sodium (COLACE) capsule 100 mg  100 mg Oral BID Idelle Crouch, MD   100 mg at 11/08/16 1011  . famotidine (PEPCID) tablet 20 mg  20 mg Oral Daily Idelle Crouch, MD   20 mg at 11/08/16 1011  . heparin injection 5,000 Units  5,000 Units Subcutaneous Q8H Idelle Crouch, MD   5,000 Units at 11/08/16 1500  . LORazepam (ATIVAN) injection 0.5 mg  0.5 mg Intravenous Q4H PRN Idelle Crouch, MD      . ondansetron [facility]) tablet 4 mg  4 mg Oral Q6H PRN Idelle Crouch, MD       Or  . ondansetron (ZOFRAN) injection 4 mg  4 mg Intravenous Q6H PRN Idelle Crouch, MD      . perphenazine (TRILAFON) tablet 4 mg  4 mg Oral QHS Almena Hokenson, Madie Reno, MD        Musculoskeletal: Strength & Muscle Tone: within normal limits Gait & Station: normal Patient leans: N/A  Psychiatric Specialty Exam: Physical Exam   Nursing note and vitals reviewed. Constitutional: He appears well-developed and well-nourished.  HENT:  Head: Normocephalic and atraumatic.  Eyes: Conjunctivae are normal. Pupils are equal, round, and reactive to light.  Neck: Normal range of motion.  Cardiovascular: Regular rhythm and normal heart sounds.   Respiratory: Effort normal. No respiratory distress.  GI: Soft.  Musculoskeletal: Normal range of motion.  Neurological: He is alert.  Skin: Skin is warm and dry.  Psychiatric: His affect is labile. His speech is rapid and/or pressured and tangential. He is agitated. He is not aggressive. Thought content is paranoid. Cognition and memory are impaired. He expresses impulsivity. He expresses no homicidal and no suicidal ideation. He exhibits abnormal recent memory.    Review of Systems  Constitutional: Negative.   HENT: Negative.   Eyes: Negative.   Respiratory: Negative.   Cardiovascular: Negative.   Gastrointestinal: Negative.   Musculoskeletal: Negative.   Skin: Negative.   Neurological: Negative.   Psychiatric/Behavioral: Positive for memory loss. Negative for depression, hallucinations, substance abuse and suicidal ideas. The patient is nervous/anxious and has insomnia.     Blood pressure (!) 145/94, pulse 100, temperature 98.2 F (36.8 C), temperature source Oral, resp. rate 18, height '5\' 9"'$  (1.753 m), weight 74.6 kg (164 lb 7.4 oz), SpO2 99 %.Body mass index is 24.29 kg/m.  General Appearance: Fairly Groomed  Eye Contact:  Good  Speech:  Pressured  Volume:  Increased  Mood:  Irritable  Affect:  Labile  Thought Process:  Disorganized  Orientation:  Full (Time, Place, and Person)  Thought Content:  Paranoid Ideation and Tangential  Suicidal Thoughts:  No  Homicidal Thoughts:  No  Memory:  Immediate;   Fair Recent;   Fair Remote;   Fair  Judgement:  Impaired  Insight:  Shallow  Psychomotor Activity:  Decreased  Concentration:  Concentration: Fair  Recall:  [employer] of Knowledge:  Fair  Language:  Fair  Akathisia:  No  Handed:  Right  AIMS (if indicated):     Assets:  Desire for Improvement Financial Resources/Insurance Housing Physical Health Resilience  ADL's:  Intact  Cognition:  WNL  Sleep:        Treatment Plan Summary: Daily contact with patient to assess and evaluate symptoms and progress in treatment, Medication management and Plan Patient is recovering from his lithium toxicity. It's not clear exactly what got him into this condition. His act team does not think that he overdosed on his lithium. Possibly developed rhabdomyolysis from heat exhaustion or dehydration and then developed lithium toxicity on top of it. Not clear whether he will be able to tolerate lithium in the future. Depends on how much his kidney function improves right now I think. For now we need to keep him off of it. Question was raised as to whether he needs further dialysis. I would recommend that we hold off on further dialysis at this point and recheck his lithium level. If he continues to come down on its own dialysis skin I would think probably be discontinued. If his lithium level starts to creep back up it probably needs to be reinitiated. I am going to add Klonopin 0.5 mg 3 times a day to help control his agitation. I written an order for a clozapine level to be checked tomorrow. He is act team is definitely involved in aware of his situation.  Disposition: Supportive therapy provided about ongoing stressors.  Alethia Berthold, MD 11/08/2016 4:35 PM

## 2016-11-08 NOTE — Care Management (Signed)
Spoke with patient this morning and unable to keep patient on track with subject.  Verbalizes he is angry with his friend Carolynn Comment for calling the police and having him locked up here when he wasn't doing any drugs. Made many attempts to explain to patient that he is in the hospital because he was very sick.  Explained that "using drugs" had nothing to do with it and that his friend really helped him. he does not believe CM when she says that his ACT Team is aware he is in the hospital. With patient's current mental status- makes CM wonder how patient is able to manage in an independent environment.  Attending aware.  Awaiting follow up visit by psych.

## 2016-11-08 NOTE — Progress Notes (Signed)
[facility]  ROUNDING NOTE   Subjective:   Two dialysis treatments. Patient's neurologic status has improved.   Lithium level 1.1  Objective:  Vital signs in last 24 hours:  Temp:  [97.9 F (36.6 C)-98.9 F (37.2 C)] 98.5 F (36.9 C) (06/12 0455) Pulse Rate:  [71-87] 87 (06/12 0455) Resp:  [16-20] 19 (06/11 1947) BP: (122-132)/(70-87) 124/70 (06/12 0455) SpO2:  [100 %] 100 % (06/12 0455) Weight:  [74.2 kg (163 lb 9.3 oz)-74.6 kg (164 lb 7.4 oz)] 74.6 kg (164 lb 7.4 oz) (06/12 0500)  Weight change: -0.9 kg (-1 lb 15.8 oz) Filed Weights   11/07/16 1025 11/07/16 1301 11/08/16 0500  Weight: 74.2 kg (163 lb 9.3 oz) 74.2 kg (163 lb 9.3 oz) 74.6 kg (164 lb 7.4 oz)    Intake/Output: I/O last 3 completed shifts: In: 3046.4 [P.O.:480; I.V.:2566.4] Out: 1200 [Urine:1200]   Intake/Output this shift:  Total I/O In: 240 [P.O.:240] Out: 275 [Urine:275]  Physical Exam: General: NAD,   Head: Normocephalic, atraumatic. Moist oral mucosal membranes  Eyes: Anicteric, PERRL  Neck: Supple, trachea midline  Lungs:  Clear to auscultation  Heart: Regular rate and rhythm  Abdomen:  Soft, nontender  Extremities: no peripheral edema.  Neurologic: +tardive dyskinesia  Skin: No lesions  Access: Right femoral temp HD catheter 6/10 Dr. Delana Meyer    Basic Metabolic Panel:  Recent Labs Lab 11/05/16 1857 11/05/16 2351 11/06/16 0358 11/06/16 1635 11/07/16 0312 11/08/16 0442  NA 130* 132* 133*  --  144 141  K 3.0* 3.1* 2.7*  --  3.8 4.2  CL 81* 87* 90*  --  111 106  CO2 29 31 31   --  26 31  GLUCOSE 179* 141* 119*  --  133* 103*  BUN 78* 77* 79*  --  53* 26*  CREATININE 11.34* 10.16* 9.51*  --  4.84* 2.52*  CALCIUM 9.0 8.6* 8.3*  --  9.3 9.0  MG  --  2.3  --   --   --   --   PHOS  --  6.4*  --  2.3*  --   --     Liver Function Tests:  Recent Labs Lab 11/05/16 1857 11/06/16 0358  AST 30 24  ALT 13* 12*  ALKPHOS 136* 104  BILITOT 1.3* 0.8  PROT 7.7 6.2*   ALBUMIN 4.8 3.7    Recent Labs Lab 11/05/16 1857  LIPASE 82*   No results for input(s): AMMONIA in the last 168 hours.  CBC:  Recent Labs Lab 11/05/16 1857 11/06/16 0358  WBC 12.7* 13.4*  NEUTROABS 11.7*  --   HGB 12.0* 10.8*  HCT 35.7* 32.1*  MCV 85.6 86.6  PLT 188 174    Cardiac Enzymes:  Recent Labs Lab 11/05/16 1857 11/06/16 0358 11/07/16 0312 11/08/16 0442  CKTOTAL 700* 983* 766* 326  TROPONINI <0.03  --   --   --     BNP: Invalid input(s): POCBNP  CBG: No results for input(s): GLUCAP in the last 168 hours.  Microbiology: No results found for this or any previous visit.  Coagulation Studies: No results for input(s): LABPROT, INR in the last 72 hours.  Urinalysis:  Recent Labs  11/06/16 0833  COLORURINE YELLOW*  LABSPEC 1.009  PHURINE 5.0  GLUCOSEU NEGATIVE  HGBUR MODERATE*  BILIRUBINUR NEGATIVE  KETONESUR NEGATIVE  PROTEINUR NEGATIVE  NITRITE NEGATIVE  LEUKOCYTESUR NEGATIVE      Imaging: No results found.   Medications:   . sodium chloride 50 mL/hr at 11/08/16  0559   . aspirin EC  81 mg Oral Daily  . cloZAPine  300 mg Oral QHS  . docusate sodium  100 mg Oral BID  . famotidine  20 mg Oral Daily  . heparin  5,000 Units Subcutaneous Q8H  . perphenazine  2 mg Oral QHS   acetaminophen **OR** acetaminophen, bisacodyl, LORazepam, ondansetron **OR** ondansetron (ZOFRAN) IV  Assessment/ Plan:  [name] is a 51 y.o. black male with Schizoaffective disorder hypertension, GERD who was admitted to [facility] on 11/05/2016   1.  Acute renal failure secondary to prolonged dehydration and lithium exposure. 2.  Rhabodmyolysis. 3.  Hyponatremia. 4.  Hypokalemia. 5.  Lithium toxicity   Plan:  - Two hemodialysis treatments.  - Check neuro status and lithium levels.  - Continue IV fluids NS at 3mL/hr for one more day.  - Appreciate psych input.    LOS: Edmunds, Vilonia 6/12/201811:26 AM

## 2016-11-09 LAB — RENAL FUNCTION PANEL
ALBUMIN: 3.1 g/dL — AB (ref 3.5–5.0)
Anion gap: 4 — ABNORMAL LOW (ref 5–15)
BUN: 22 mg/dL — ABNORMAL HIGH (ref 6–20)
CHLORIDE: 110 mmol/L (ref 101–111)
CO2: 28 mmol/L (ref 22–32)
CREATININE: 2.34 mg/dL — AB (ref 0.61–1.24)
Calcium: 9 mg/dL (ref 8.9–10.3)
GFR calc non Af Amer: 31 mL/min — ABNORMAL LOW (ref >60)
GFR, EST AFRICAN AMERICAN: 36 mL/min — AB (ref >60)
Glucose, Bld: 105 mg/dL — ABNORMAL HIGH (ref 65–99)
PHOSPHORUS: 3.3 mg/dL (ref 2.5–4.6)
POTASSIUM: 4.5 mmol/L (ref 3.5–5.1)
Sodium: 142 mmol/L (ref 135–145)

## 2016-11-09 LAB — LITHIUM LEVEL: Lithium Lvl: 0.92 mmol/L (ref 0.60–1.20)

## 2016-11-09 MED ORDER — SODIUM CHLORIDE 0.9 % IV SOLN
INTRAVENOUS | Status: DC
  Administered 2016-11-09: 21:00:00 via INTRAVENOUS

## 2016-11-09 NOTE — Progress Notes (Addendum)
LCSW received call from CM regarding patient being upset and wanting someone to call his ACT team.  Patient is active with [facility] in [place].  Call placed to on call crisis SW [phone]. Message left for on call worker who reports they will call back in 15 minutes.   Will update patient and staff once call returned and LCSW has spoken with ACT provider.  2:47 PM LCSW made contact with [facility] lead [facility] team [place] regarding patient being active with team. Lars Mage reports that when patient moved into his new apartment around April/May of this year he became delusional and fired all of his ACT team. Due to patient not willing to cooperate and work with team, patient was transitioned to [facility] the end of May, first of June.  He is also involved with care coordination with [facility]:  Consetta who manages his Transition to community living and assisted patient with community placement. Beverely Pace is very familiar with patient's case and involved. She will come visit patient on 11/10/16.  Patient is his own legal guardian at this time per report of care coordinator.  Contact for new ACT team:  (office:  [phone]) or Colletta Maryland:  [phone].  Colletta Maryland called and unable to reach, user was busy.  Will continue to reach.      Will follow up and assist with any other additional needs.  Lane Hacker, MSW Clinical Social Work: [employer] for :  [phone]

## 2016-11-09 NOTE — Consult Note (Signed)
[facility] [facility] Consult   Reason for Consult:  This is a follow-up consult for this 51 year old man with a history of schizophrenia who is currently in the hospital recovering from lithium toxicity. Referring Physician:  Leslye Peer Patient Identification: Nicholas Cole MRN:  [id number] Principal Diagnosis: ARF (acute renal failure) [facility]) Diagnosis:   Patient Active Problem List   Diagnosis Date Noted  . Lithium toxicity [T56.891A] 11/06/2016  . ARF (acute renal failure) ([place]) [N17.9] 11/05/2016  . Dehydration [E86.0] 11/05/2016  . Rhabdomyolysis [M62.82] 11/05/2016  . Schizoaffective disorder, bipolar type ([place]) [F25.0] 11/05/2016    Total Time spent with patient: 30 minutes  Subjective:   Nicholas Cole is a 51 y.o. male patient admitted with "I fell down at home".  Follow-up for 51 year old man with schizoaffective disorder who is in the hospital recovering from lithium toxicity. Chart reviewed patient interviewed. According to the chart he was more psychotic this morning. This evening he seems a little bit more sedated. His thoughts are still a little bit confused but he was not hostile or threatening. He tells me he feels a little tired and couldn't figure out why that was. I pointed out to him that he had been quite sick recently. Patient is still very focused on worrying about whether his act team is aware of his condition. I was able to reassure him that they were. Patient's creatinine and lithium level are only slightly better than they were yesterday. Certainly he is not in any condition to go back on lithium at this point whether he ever will be or not is unclear.  HPI:  Patient seen chart reviewed including current notes, other psych consult from the weekend, past psychiatric notes. This is a 51 year old man with a history of schizophrenia or schizoaffective disorder who has a long history of somewhat difficult to manage psychosis and agitation. He presented to the hospital  with lithium toxicity in kidney failure with very high lithium levels. Patient has received dialysis a couple of times and had serial checks on his lithium level which is now down to 1.1 with his kidney function improving. His mental status today he is wide awake and a little bit hyperactive and labile. He is somewhat agitated or at least a little hyperverbal. Didn't make any threats but gets derailed pretty easily and has a little paranoia to his thinking. His understanding of the details of his medical condition are shaky at best. I spoke with case management as well. Patient is currently living independently  Currently living independently apparently he's been there for only a short time maybe a few months. He has a brand-new act team because he switched care recently so they are not as familiar with him. Family does not appear to be involved.  Does not drink or abuse drugs  Patient appears to be recovering kidney function rapidly. Creatinine is down to under 3 today after being as high as 11 when he presented.  Past Psychiatric History: Patient has a history of agitation and aggression and difficult to control behavior and violence when he is psychotic. When the past we have seen him even when he was on full medicines with behaviors that required seclusion and hospitalization. Spoke today with care management. It's not clear how well he is going to tolerate living independently at all given his past history. No history of suicide been on clozapine and lithium probably for a couple of years at least on a regular basis clozapine on and off for a long time same  with the lithium  Risk to Self: Is patient at risk for suicide?: No Risk to Others:   Prior Inpatient Therapy:   Prior Outpatient Therapy:    Past Medical History: History reviewed. No pertinent past medical history. History reviewed. No pertinent surgical history. Family History: History reviewed. No pertinent family history. Family  Psychiatric  History: None known Social History:  History  Alcohol use Not on file     History  Drug use: Unknown    Social History   Social History  . Marital status: Single    Spouse name: N/A  . Number of children: N/A  . Years of education: N/A   Social History Main Topics  . Smoking status: Former Research scientist (life sciences)  . Smokeless tobacco: Never Used  . Alcohol use None  . Drug use: Unknown  . Sexual activity: Not Asked   Other Topics Concern  . None   Social History Narrative  . None   Additional Social History:    Allergies:  No Known Allergies  Labs:  Results for orders placed or performed during the hospital encounter of 11/05/16 (from the past 48 hour(s))  Lithium level     Status: None   Collection Time: 11/08/16  4:42 AM  Result Value Ref Range   Lithium Lvl 1.10 0.60 - 1.20 mmol/L  TSH     Status: None   Collection Time: 11/08/16  4:42 AM  Result Value Ref Range   TSH 1.375 0.350 - 4.500 uIU/mL    Comment: Performed by a 3rd Generation assay with a functional sensitivity of <=0.01 uIU/mL.  CK     Status: None   Collection Time: 11/08/16  4:42 AM  Result Value Ref Range   Total CK 326 49 - 397 U/L  Basic metabolic panel     Status: Abnormal   Collection Time: 11/08/16  4:42 AM  Result Value Ref Range   Sodium 141 135 - 145 mmol/L   Potassium 4.2 3.5 - 5.1 mmol/L   Chloride 106 101 - 111 mmol/L   CO2 31 22 - 32 mmol/L   Glucose, Bld 103 (H) 65 - 99 mg/dL   BUN 26 (H) 6 - 20 mg/dL   Creatinine, Ser 2.52 (H) 0.61 - 1.24 mg/dL   Calcium 9.0 8.9 - 10.3 mg/dL   GFR calc non Af Amer 28 (L) >60 mL/min   GFR calc Af Amer 33 (L) >60 mL/min    Comment: (NOTE) The eGFR has been calculated using the CKD EPI equation. This calculation has not been validated in all clinical situations. eGFR's persistently <60 mL/min signify possible Chronic Kidney Disease.    Anion gap 4 (L) 5 - 15  Lithium level     Status: None   Collection Time: 11/09/16  3:27 AM  Result Value  Ref Range   Lithium Lvl 0.92 0.60 - 1.20 mmol/L  Renal function panel     Status: Abnormal   Collection Time: 11/09/16  3:27 AM  Result Value Ref Range   Sodium 142 135 - 145 mmol/L   Potassium 4.5 3.5 - 5.1 mmol/L   Chloride 110 101 - 111 mmol/L   CO2 28 22 - 32 mmol/L   Glucose, Bld 105 (H) 65 - 99 mg/dL   BUN 22 (H) 6 - 20 mg/dL   Creatinine, Ser 2.34 (H) 0.61 - 1.24 mg/dL   Calcium 9.0 8.9 - 10.3 mg/dL   Phosphorus 3.3 2.5 - 4.6 mg/dL   Albumin 3.1 (L) 3.5 - 5.0 g/dL  GFR calc non Af Amer 31 (L) >60 mL/min   GFR calc Af Amer 36 (L) >60 mL/min    Comment: (NOTE) The eGFR has been calculated using the CKD EPI equation. This calculation has not been validated in all clinical situations. eGFR's persistently <60 mL/min signify possible Chronic Kidney Disease.    Anion gap 4 (L) 5 - 15    Current Facility-Administered Medications  Medication Dose Route Frequency Provider Last Rate Last Dose  . 0.9 %  sodium chloride infusion   Intravenous Continuous Kolluru, Sarath, MD      . acetaminophen (TYLENOL) tablet 650 mg  650 mg Oral Q6H PRN Idelle Crouch, MD   650 mg at 11/08/16 1114   Or  . acetaminophen (TYLENOL) suppository 650 mg  650 mg Rectal Q6H PRN Idelle Crouch, MD      . aspirin EC tablet 81 mg  81 mg Oral Daily Idelle Crouch, MD   81 mg at 11/09/16 1029  . bisacodyl (DULCOLAX) suppository 10 mg  10 mg Rectal Daily PRN Idelle Crouch, MD      . clonazePAM Bobbye Charleston) tablet 0.5 mg  0.5 mg Oral TID Clapacs, Madie Reno, MD   0.5 mg at 11/09/16 1641  . cloZAPine (CLOZARIL) tablet 300 mg  300 mg Oral QHS Pucilowska, Jolanta B, MD   300 mg at 11/08/16 2102  . docusate sodium (COLACE) capsule 100 mg  100 mg Oral BID Idelle Crouch, MD   100 mg at 11/09/16 1029  . famotidine (PEPCID) tablet 20 mg  20 mg Oral Daily Idelle Crouch, MD   20 mg at 11/09/16 1029  . heparin injection 5,000 Units  5,000 Units Subcutaneous Q8H Idelle Crouch, MD   5,000 Units at 11/09/16  0523  . LORazepam (ATIVAN) injection 0.5 mg  0.5 mg Intravenous Q4H PRN Idelle Crouch, MD      . ondansetron [facility]) tablet 4 mg  4 mg Oral Q6H PRN Idelle Crouch, MD       Or  . ondansetron (ZOFRAN) injection 4 mg  4 mg Intravenous Q6H PRN Idelle Crouch, MD      . perphenazine (TRILAFON) tablet 4 mg  4 mg Oral QHS Clapacs, Madie Reno, MD   4 mg at 11/08/16 2102    Musculoskeletal: Strength & Muscle Tone: within normal limits Gait & Station: normal Patient leans: N/A  Psychiatric Specialty Exam: Physical Exam  Nursing note and vitals reviewed. Constitutional: He appears well-developed and well-nourished.  HENT:  Head: Normocephalic and atraumatic.  Eyes: Conjunctivae are normal. Pupils are equal, round, and reactive to light.  Neck: Normal range of motion.  Cardiovascular: Regular rhythm and normal heart sounds.   Respiratory: Effort normal. No respiratory distress.  GI: Soft.  Musculoskeletal: Normal range of motion.  Neurological: He is alert.  Skin: Skin is warm and dry.  Psychiatric: His affect is labile. His speech is tangential. His speech is not rapid and/or pressured. He is not agitated and not aggressive. Thought content is paranoid. Cognition and memory are impaired. He expresses impulsivity. He expresses no homicidal and no suicidal ideation. He exhibits abnormal recent memory.    Review of Systems  Constitutional: Negative.   HENT: Negative.   Eyes: Negative.   Respiratory: Negative.   Cardiovascular: Negative.   Gastrointestinal: Negative.   Musculoskeletal: Negative.   Skin: Negative.   Neurological: Negative.   Psychiatric/Behavioral: Positive for memory loss. Negative for depression, hallucinations, substance abuse and suicidal ideas.  The patient is nervous/anxious and has insomnia.     Blood pressure (!) 137/91, pulse 76, temperature 98.1 F (36.7 C), temperature source Oral, resp. rate 14, height _0  (1.753 m), weight 75.3 kg (166 lb 0.1 oz), SpO2  100 %.Body mass index is 24.51 kg/m.  General Appearance: Fairly Groomed  Eye Contact:  Good  Speech:  Pressured  Volume:  Increased  Mood:  Irritable  Affect:  Labile  Thought Process:  Disorganized  Orientation:  Full (Time, Place, and Person)  Thought Content:  Paranoid Ideation and Tangential  Suicidal Thoughts:  No  Homicidal Thoughts:  No  Memory:  Immediate;   Fair Recent;   Fair Remote;   Fair  Judgement:  Impaired  Insight:  Shallow  Psychomotor Activity:  Decreased  Concentration:  Concentration: Fair  Recall:  [employer] of Knowledge:  Fair  Language:  Fair  Akathisia:  No  Handed:  Right  AIMS (if indicated):     Assets:  Desire for Improvement Financial Resources/Insurance Housing Physical Health Resilience  ADL's:  Intact  Cognition:  WNL  Sleep:        Treatment Plan Summary: Daily contact with patient to assess and evaluate symptoms and progress in treatment, Medication management and Plan Continue off of the lithium but on his clozapine. Added a little bit of Klonopin yesterday to help with the agitation. He hasn't had any aggressive or really remarkably bad behavior. Reassured him that the treatment team is working hard on taking care of all of his needs. No change to medicine for today  Disposition: Supportive therapy provided about ongoing stressors.  Alethia Berthold, MD 11/09/2016 7:37 PM

## 2016-11-09 NOTE — Progress Notes (Signed)
Patient ID: Nicholas Cole, male   DOB: 1965/07/28, 51 y.o.   MRN: [id number]    [facility] PROGRESS NOTE  Nicholas Cole [facility]MC:[id number] DOB: March 21, 1966 DOA: 11/05/2016 PCP: Lorelee Market, MD  HPI/Subjective: Patient upset with me about wanting to send him home. I told him that I need to see his kidney function better prior to making that decision. Patient states he's trying to do the best thing for him to get better by eating. Nursing staff but a condom catheter on him because he has had some incontinence.  Objective: Vitals:   11/09/16 1126 11/09/16 1355  BP:  (!) 137/91  Pulse: 78 76  Resp:  14  Temp:  98.1 F (36.7 C)    Filed Weights   11/07/16 1301 11/08/16 0500 11/09/16 0500  Weight: 74.2 kg (163 lb 9.3 oz) 74.6 kg (164 lb 7.4 oz) 75.3 kg (166 lb 0.1 oz)    ROS: Review of Systems  Constitutional: Negative for chills and fever.  Eyes: Negative for blurred vision.  Respiratory: Negative for cough and shortness of breath.   Cardiovascular: Negative for chest pain.  Gastrointestinal: Negative for abdominal pain, constipation, diarrhea, nausea and vomiting.  Genitourinary: Negative for dysuria.  Musculoskeletal: Negative for joint pain.  Neurological: Positive for tremors and weakness. Negative for dizziness and headaches.   Exam: Physical Exam  HENT:  Nose: No mucosal edema.  Mouth/Throat: No oropharyngeal exudate or posterior oropharyngeal edema.  Eyes: Conjunctivae, EOM and lids are normal. Pupils are equal, round, and reactive to light.  Neck: No JVD present. Carotid bruit is not present. No edema present. No thyroid mass and no thyromegaly present.  Cardiovascular: S1 normal and S2 normal.  Exam reveals no gallop.   No murmur heard. Pulses:      Dorsalis pedis pulses are 2+ on the right side, and 2+ on the left side.  Respiratory: No respiratory distress. He has no wheezes. He has no rhonchi. He has no rales.  GI: Soft. Bowel sounds are normal. There is  no tenderness.  Musculoskeletal:       Right ankle: He exhibits no swelling.       Left ankle: He exhibits no swelling.  Lymphadenopathy:    He has no cervical adenopathy.  Neurological: He is alert. He displays no tremor. No cranial nerve deficit.  Skin: Skin is warm. No rash noted. Nails show no clubbing.  Psychiatric: He has a normal mood and affect.      Data Reviewed: Basic Metabolic Panel:  Recent Labs Lab 11/05/16 2351 11/06/16 0358 11/06/16 1635 11/07/16 0312 11/08/16 0442 11/09/16 0327  NA 132* 133*  --  144 141 142  K 3.1* 2.7*  --  3.8 4.2 4.5  CL 87* 90*  --  111 106 110  CO2 31 31  --  26 31 28   GLUCOSE 141* 119*  --  133* 103* 105*  BUN 77* 79*  --  53* 26* 22*  CREATININE 10.16* 9.51*  --  4.84* 2.52* 2.34*  CALCIUM 8.6* 8.3*  --  9.3 9.0 9.0  MG 2.3  --   --   --   --   --   PHOS 6.4*  --  2.3*  --   --  3.3   Liver Function Tests:  Recent Labs Lab 11/05/16 1857 11/06/16 0358 11/09/16 0327  AST 30 24  --   ALT 13* 12*  --   ALKPHOS 136* 104  --   BILITOT 1.3* 0.8  --  PROT 7.7 6.2*  --   ALBUMIN 4.8 3.7 3.1*    Recent Labs Lab 11/05/16 1857  LIPASE 82*   CBC:  Recent Labs Lab 11/05/16 1857 11/06/16 0358  WBC 12.7* 13.4*  NEUTROABS 11.7*  --   HGB 12.0* 10.8*  HCT 35.7* 32.1*  MCV 85.6 86.6  PLT 188 174   Cardiac Enzymes:  Recent Labs Lab 11/05/16 1857 11/06/16 0358 11/07/16 0312 11/08/16 0442  CKTOTAL 700* 983* 766* 326  TROPONINI <0.03  --   --   --      Studies: No results found.  Scheduled Meds: . aspirin EC  81 mg Oral Daily  . clonazePAM  0.5 mg Oral TID  . cloZAPine  300 mg Oral QHS  . docusate sodium  100 mg Oral BID  . famotidine  20 mg Oral Daily  . heparin  5,000 Units Subcutaneous Q8H  . perphenazine  4 mg Oral QHS   Continuous Infusions: . sodium chloride      Assessment/Plan:  1. Lithium toxicity. On admission with hemoglobin level was 2.7. Today it's 0.9. Normal range is 0-1.2. Lithium is  held. Case discussed with nephrology Dr. Rolly Salter. Patient had 2 dialysis sessions. Holding dialysis at this point Continue to monitor lithium levels. 2. Acute kidney injury on chronic kidney disease stage 2. IV fluid hydration. Nephrology consultation. Renal ultrasound showed medical renal disease. Creatinine improved after dialysis. Monitor with holding dialysis. Creatinine down to 2.34.   3. Hypokalemia.  Replaced. 4. Rhabdomyolysis probably from fall. CPK improved with IV fluids  5. Schizoaffective disorder. Psychiatric consultation for medication management. Appreciate psychiatric consultation. Patient currently on clozapine and perphenazine and Klonopin.   Code Status:     Code Status Orders        Start     Ordered   11/05/16 2206  Full code  Continuous     11/05/16 2205    Code Status History    Date Active Date Inactive Code Status Order ID Comments User Context   This patient has a current code status but no historical code status.    Advance Directive Documentation     Most Recent Value  Type of Advance Directive  Healthcare Power of [place]  Pre-existing out of facility DNR order (yellow form or pink MOST form)  -  "MOST" Form in Place?  -      Disposition Plan: Creatinine must improve prior to disposition  Consultants:  Nephrology  Psychiatry  Time spent: 24 minutes  [place], [place]

## 2016-11-09 NOTE — Plan of Care (Signed)
Problem: Education: Goal: Knowledge of [facility] Education information/materials will improve Outcome: Not Progressing Patient is confused.  Problem: Health Behavior/Discharge Planning: Goal: Ability to manage health-related needs will improve Outcome: Not Progressing Patient needs assistance.

## 2016-11-09 NOTE — Progress Notes (Signed)
R Femoral catheter removed per MD order.

## 2016-11-09 NOTE — Progress Notes (Signed)
Physical Therapy Treatment Patient Details Name: Nicholas Cole MRN: [id number] DOB: [birth year] Today's Date: 11/09/2016    History of Present Illness 51 y.o. male has a past medical history significant for schizoaffective d/o and HTN brought in by EMS after being found on the floor in his home lethargic. Pt has not been eating or drinking well but cannot articulate how long he was lying on the floor. In ER, pt found to be in ARF with rhabdomyolysis.  Had temporary dialysis cath placed and numbers have been getting better.     PT Comments    Pt agreeable to PT after some agitation/rant; pt initially upset that he was "cut upon' referring to temporary femoral catheter. Pt "tells his story" and eases with pleasant conversation. Pt apologies and expresses difficulty with "new" faces in the hospital. Pt wishes to see "Tracey from [facility]"; offered encouragement that SW has contacted [facility] and is awaiting a return call. Pt willing to participate with supine bed exercises and does so with assist as needed; avoided hip flexion on the right due to catheter. Weakness noted throughout with trembling in lower extremities at times. Continue PT to improve strength and endurance to improve function.    Follow Up Recommendations  Supervision/Assistance - 24 hour;Other (comment) (Further determination when pt able to mobilize out of bed)     Equipment Recommendations       Recommendations for Other Services       Precautions / Restrictions Precautions Precautions: Fall;Other (comment) (R temporary femoral catheter) Restrictions Weight Bearing Restrictions: No    Mobility  Bed Mobility               General bed mobility comments: Not tested due to temporary femoral catheter  Transfers                    Ambulation/Gait                 Stairs            Wheelchair Mobility    Modified Rankin (Stroke Patients Only)       Balance                                             Cognition Arousal/Alertness: Awake/alert Behavior During Therapy: Agitated (hyper verbal; varied conversations) Overall Cognitive Status: History of cognitive impairments - at baseline                                        Exercises General Exercises - Lower Extremity Ankle Circles/Pumps: AROM;Both;20 reps;Supine Quad Sets: Strengthening;Both;20 reps;Supine Gluteal Sets: Strengthening;Both;20 reps;Supine Short Arc Quad: AROM;Both;20 reps;Supine Heel Slides: AROM;Left;20 reps;Supine Hip ABduction/ADduction: AAROM;Both;20 reps;Supine Straight Leg Raises: AAROM;Left;20 reps;Supine    General Comments        Pertinent Vitals/Pain Pain Assessment: No/denies pain (states intermittent at catherter sitem, not currently)    Home Living                      Prior Function            PT Goals (current goals can now be found in the care plan section) Progress towards PT goals: Progressing toward goals    Frequency    Min 2X/week  PT Plan Current plan remains appropriate    Co-evaluation              AM-PAC PT "6 Clicks" Daily Activity  Outcome Measure  Difficulty turning over in bed (including adjusting bedclothes, sheets and blankets)?: None Difficulty moving from lying on back to sitting on the side of the bed? : None Difficulty sitting down on and standing up from a chair with arms (e.g., wheelchair, bedside commode, etc,.)?: None Help needed moving to and from a bed to chair (including a wheelchair)?: A Little Help needed walking in hospital room?: A Little Help needed climbing 3-5 steps with a railing? : A Little 6 Click Score: 21    End of Session   Activity Tolerance: Patient tolerated treatment well Patient left: in bed;with call bell/phone within reach;with bed alarm set   PT Visit Diagnosis: Muscle weakness (generalized) (M62.81);Difficulty in walking, not elsewhere classified (R26.2)      Time: 1962-2297 PT Time Calculation (min) (ACUTE ONLY): 29 min  Charges:  $Therapeutic Exercise: 23-37 mins                    G Codes:        Larae Grooms, PTA 11/09/2016, 11:55 AM

## 2016-11-09 NOTE — Care Management Important Message (Signed)
Important Message  Patient Details  Name: Nicholas Cole MRN: [id number] Date of Birth: 01/19/1966   Medicare Important Message Given:  Yes (signed and scanned in from 6/12)    Beverly Sessions, RN 11/09/2016, 3:53 PM

## 2016-11-09 NOTE — Progress Notes (Signed)
[facility]  ROUNDING NOTE   Subjective:   Patient more paranoid and psychotic this morning.   Hemodialysis catheter removed.   Objective:  Vital signs in last 24 hours:  Temp:  [97.6 F (36.4 C)-98.1 F (36.7 C)] 98.1 F (36.7 C) (06/13 1355) Pulse Rate:  [76-82] 76 (06/13 1355) Resp:  [14-20] 14 (06/13 1355) BP: (129-137)/(82-91) 137/91 (06/13 1355) SpO2:  [97 %-100 %] 100 % (06/13 1355) Weight:  [75.3 kg (166 lb 0.1 oz)] 75.3 kg (166 lb 0.1 oz) (06/13 0500)  Weight change: 1.1 kg (2 lb 6.8 oz) Filed Weights   11/07/16 1301 11/08/16 0500 11/09/16 0500  Weight: 74.2 kg (163 lb 9.3 oz) 74.6 kg (164 lb 7.4 oz) 75.3 kg (166 lb 0.1 oz)    Intake/Output: I/O last 3 completed shifts: In: 3766.4 [P.O.:1200; I.V.:2566.4] Out: 3150 [Urine:3150]   Intake/Output this shift:  Total I/O In: 1742 [P.O.:240; I.V.:1502] Out: 1325 [Urine:1325]  Physical Exam: General: NAD,   Head: Normocephalic, atraumatic. Moist oral mucosal membranes  Eyes: Anicteric, PERRL  Neck: Supple, trachea midline  Lungs:  Clear to auscultation  Heart: Regular rate and rhythm  Abdomen:  Soft, nontender  Extremities: no peripheral edema.  Neurologic: +tardive dyskinesia  Skin: No lesions  Access: none    Basic Metabolic Panel:  Recent Labs Lab 11/05/16 2351 11/06/16 0358 11/06/16 1635 11/07/16 0312 11/08/16 0442 11/09/16 0327  NA 132* 133*  --  144 141 142  K 3.1* 2.7*  --  3.8 4.2 4.5  CL 87* 90*  --  111 106 110  CO2 31 31  --  26 31 28   GLUCOSE 141* 119*  --  133* 103* 105*  BUN 77* 79*  --  53* 26* 22*  CREATININE 10.16* 9.51*  --  4.84* 2.52* 2.34*  CALCIUM 8.6* 8.3*  --  9.3 9.0 9.0  MG 2.3  --   --   --   --   --   PHOS 6.4*  --  2.3*  --   --  3.3    Liver Function Tests:  Recent Labs Lab 11/05/16 1857 11/06/16 0358 11/09/16 0327  AST 30 24  --   ALT 13* 12*  --   ALKPHOS 136* 104  --   BILITOT 1.3* 0.8  --   PROT 7.7 6.2*  --   ALBUMIN 4.8 3.7 3.1*     Recent Labs Lab 11/05/16 1857  LIPASE 82*   No results for input(s): AMMONIA in the last 168 hours.  CBC:  Recent Labs Lab 11/05/16 1857 11/06/16 0358  WBC 12.7* 13.4*  NEUTROABS 11.7*  --   HGB 12.0* 10.8*  HCT 35.7* 32.1*  MCV 85.6 86.6  PLT 188 174    Cardiac Enzymes:  Recent Labs Lab 11/05/16 1857 11/06/16 0358 11/07/16 0312 11/08/16 0442  CKTOTAL 700* 983* 766* 326  TROPONINI <0.03  --   --   --     BNP: Invalid input(s): POCBNP  CBG: No results for input(s): GLUCAP in the last 168 hours.  Microbiology: No results found for this or any previous visit.  Coagulation Studies: No results for input(s): LABPROT, INR in the last 72 hours.  Urinalysis: No results for input(s): COLORURINE, LABSPEC, PHURINE, GLUCOSEU, HGBUR, BILIRUBINUR, KETONESUR, PROTEINUR, UROBILINOGEN, NITRITE, LEUKOCYTESUR in the last 72 hours.  Invalid input(s): APPERANCEUR    Imaging: No results found.   Medications:   . sodium chloride     . aspirin EC  81 mg Oral Daily  .  clonazePAM  0.5 mg Oral TID  . cloZAPine  300 mg Oral QHS  . docusate sodium  100 mg Oral BID  . famotidine  20 mg Oral Daily  . heparin  5,000 Units Subcutaneous Q8H  . perphenazine  4 mg Oral QHS   acetaminophen **OR** acetaminophen, bisacodyl, LORazepam, ondansetron **OR** ondansetron (ZOFRAN) IV  Assessment/ Plan:  Nicholas Cole is a 51 y.o. black male with Schizoaffective disorder hypertension, GERD who was admitted to [facility] on 11/05/2016   1.  Acute renal failure secondary to prolonged dehydration and lithium exposure. 2.  Rhabodmyolysis. 3.  Hyponatremia. 4.  Hypokalemia. 5.  Lithium toxicity   Plan:  - Status post two hemodialysis treatments.  - Check neuro status and lithium levels.  - Continue IV fluids NS at 80mL/hr for one more day.  - Appreciate psych input.    LOS: [place], [place] 6/13/20183:00 PM

## 2016-11-10 LAB — BASIC METABOLIC PANEL
Anion gap: 3 — ABNORMAL LOW (ref 5–15)
BUN: 19 mg/dL (ref 6–20)
CALCIUM: 9.3 mg/dL (ref 8.9–10.3)
CHLORIDE: 114 mmol/L — AB (ref 101–111)
CO2: 26 mmol/L (ref 22–32)
CREATININE: 1.99 mg/dL — AB (ref 0.61–1.24)
GFR calc non Af Amer: 38 mL/min — ABNORMAL LOW (ref >60)
GFR, EST AFRICAN AMERICAN: 44 mL/min — AB (ref >60)
Glucose, Bld: 98 mg/dL (ref 65–99)
Potassium: 4.7 mmol/L (ref 3.5–5.1)
SODIUM: 143 mmol/L (ref 135–145)

## 2016-11-10 LAB — CLOZAPINE (CLOZARIL)
CLOZAPINE LVL: 329 ng/mL — AB (ref 350–650)
NORCLOZAPINE: 91 ng/mL
Total(Cloz+Norcloz): 420 ng/mL

## 2016-11-10 MED ORDER — DIVALPROEX SODIUM 500 MG PO DR TAB
500.0000 mg | DELAYED_RELEASE_TABLET | Freq: Two times a day (BID) | ORAL | Status: DC
  Administered 2016-11-10 – 2016-11-11 (×2): 500 mg via ORAL
  Filled 2016-11-10 (×3): qty 1

## 2016-11-10 MED ORDER — LORAZEPAM 2 MG/ML IJ SOLN
2.0000 mg | Freq: Once | INTRAMUSCULAR | Status: AC
  Administered 2016-11-10: 2 mg via INTRAVENOUS
  Filled 2016-11-10: qty 1

## 2016-11-10 MED ORDER — LORAZEPAM 2 MG/ML IJ SOLN
1.0000 mg | Freq: Once | INTRAMUSCULAR | Status: DC
Start: 2016-11-10 — End: 2016-11-10

## 2016-11-10 MED ORDER — HALOPERIDOL LACTATE 5 MG/ML IJ SOLN: 5.0000 mg | Freq: Once | INTRAMUSCULAR | Status: DC

## 2016-11-10 NOTE — Progress Notes (Signed)
[facility]  ROUNDING NOTE   Subjective:   Still having paranoid Creatinine 1.99 (2.34)  Objective:  Vital signs in last 24 hours:  Temp:  [97.8 F (36.6 C)-98.1 F (36.7 C)] 97.8 F (36.6 C) (06/14 0538) Pulse Rate:  [74-80] 74 (06/14 0538) Resp:  [14-20] 20 (06/13 2050) BP: (122-151)/(82-104) 122/82 (06/14 0538) SpO2:  [97 %-100 %] 100 % (06/14 0538) Weight:  [75.5 kg (166 lb 7.2 oz)] 75.5 kg (166 lb 7.2 oz) (06/14 0500)  Weight change: 0.2 kg (7.1 oz) Filed Weights   11/08/16 0500 11/09/16 0500 11/10/16 0500  Weight: 74.6 kg (164 lb 7.4 oz) 75.3 kg (166 lb 0.1 oz) 75.5 kg (166 lb 7.2 oz)    Intake/Output: I/O last 3 completed shifts: In: 4731.7 [P.O.:2440; I.V.:2291.7] Out: 4600 [Urine:4600]   Intake/Output this shift:  Total I/O In: 531 [P.O.:480; I.V.:51] Out: 325 [Urine:325]  Physical Exam: General: NAD,   Head: Normocephalic, atraumatic. Moist oral mucosal membranes  Eyes: Anicteric, PERRL  Neck: Supple, trachea midline  Lungs:  Clear to auscultation  Heart: Regular rate and rhythm  Abdomen:  Soft, nontender  Extremities: no peripheral edema.  Neurologic: +tardive dyskinesia  Skin: No lesions  Access: none    Basic Metabolic Panel:  Recent Labs Lab 11/05/16 2351 11/06/16 0358 11/06/16 1635 11/07/16 0312 11/08/16 0442 11/09/16 0327 11/10/16 0723  NA 132* 133*  --  144 141 142 143  K 3.1* 2.7*  --  3.8 4.2 4.5 4.7  CL 87* 90*  --  111 106 110 114*  CO2 31 31  --  26 31 28 26   GLUCOSE 141* 119*  --  133* 103* 105* 98  BUN 77* 79*  --  53* 26* 22* 19  CREATININE 10.16* 9.51*  --  4.84* 2.52* 2.34* 1.99*  CALCIUM 8.6* 8.3*  --  9.3 9.0 9.0 9.3  MG 2.3  --   --   --   --   --   --   PHOS 6.4*  --  2.3*  --   --  3.3  --     Liver Function Tests:  Recent Labs Lab 11/05/16 1857 11/06/16 0358 11/09/16 0327  AST 30 24  --   ALT 13* 12*  --   ALKPHOS 136* 104  --   BILITOT 1.3* 0.8  --   PROT 7.7 6.2*  --   ALBUMIN 4.8 3.7  3.1*    Recent Labs Lab 11/05/16 1857  LIPASE 82*   No results for input(s): AMMONIA in the last 168 hours.  CBC:  Recent Labs Lab 11/05/16 1857 11/06/16 0358  WBC 12.7* 13.4*  NEUTROABS 11.7*  --   HGB 12.0* 10.8*  HCT 35.7* 32.1*  MCV 85.6 86.6  PLT 188 174    Cardiac Enzymes:  Recent Labs Lab 11/05/16 1857 11/06/16 0358 11/07/16 0312 11/08/16 0442  CKTOTAL 700* 983* 766* 326  TROPONINI <0.03  --   --   --     BNP: Invalid input(s): POCBNP  CBG: No results for input(s): GLUCAP in the last 168 hours.  Microbiology: No results found for this or any previous visit.  Coagulation Studies: No results for input(s): LABPROT, INR in the last 72 hours.  Urinalysis: No results for input(s): COLORURINE, LABSPEC, PHURINE, GLUCOSEU, HGBUR, BILIRUBINUR, KETONESUR, PROTEINUR, UROBILINOGEN, NITRITE, LEUKOCYTESUR in the last 72 hours.  Invalid input(s): APPERANCEUR    Imaging: No results found.   Medications:   . sodium chloride 50 mL/hr at 11/09/16 2126   .  aspirin EC  81 mg Oral Daily  . clonazePAM  0.5 mg Oral TID  . cloZAPine  300 mg Oral QHS  . docusate sodium  100 mg Oral BID  . famotidine  20 mg Oral Daily  . heparin  5,000 Units Subcutaneous Q8H  . perphenazine  4 mg Oral QHS   acetaminophen **OR** acetaminophen, bisacodyl, LORazepam, ondansetron **OR** ondansetron (ZOFRAN) IV  Assessment/ Plan:  Nicholas Cole is a 51 y.o. black male with Schizoaffective disorder hypertension, GERD who was admitted to [facility] on 11/05/2016   1.  Acute renal failure secondary to prolonged dehydration and lithium exposure. 2.  Rhabodmyolysis. 3.  Hyponatremia. 4.  Hypokalemia. 5.  Lithium toxicity   Plan:  - Status post two hemodialysis treatments.  - Check neuro status and lithium levels.  - Discontinue IV fluids - Appreciate psych input.    LOS: [place], [place] 6/14/201811:38 AM

## 2016-11-10 NOTE — Clinical Social Work Note (Signed)
Colletta Maryland with [facility] returned CSW call and she stated that she called and spoke with patient and he is fine with discharging tomorrow if that is decided. Colletta Maryland requested a list of his discharge medications as soon as she could get it so that they can get his medications. She also stated that they would provide transportation home. She reported that they will typically visit their patient's 2-3 times per week and that they are going to be working on budgeting, diet, eating habits, and cooking habits with him. CSW asked if they could see him daily for the first week or two and she stated that they can. Shela Leff MSW,LCSW [phone]

## 2016-11-10 NOTE — Care Management (Signed)
RNCM consulted for home health needs.  Pt to revaluate now that temp HD cath has been removed.  Please see CSW note related to patient's [facility] plan

## 2016-11-10 NOTE — Consult Note (Signed)
Nicholas Cole   Reason for Cole:  This is a follow-up Cole for this 51 year old man with a history of schizophrenia who is currently in the hospital recovering from lithium toxicity. Referring Physician:  Leslye Peer Patient Identification: Nicholas Cole MRN:  [id number] Principal Diagnosis: ARF (acute renal failure) [facility]) Diagnosis:   Patient Active Problem List   Diagnosis Date Noted  . Lithium toxicity [T56.891A] 11/06/2016  . ARF (acute renal failure) ([place]) [N17.9] 11/05/2016  . Dehydration [E86.0] 11/05/2016  . Rhabdomyolysis [M62.82] 11/05/2016  . Schizoaffective disorder, bipolar type ([place]) [F25.0] 11/05/2016    Total Time spent with patient: 30 minutes  Subjective:   Nicholas Cole is a 51 y.o. male patient admitted with "I fell down at home".  Follow-up for 51 year old man with schizoaffective disorder who is in the hospital recovering from lithium toxicity. Chart reviewed patient interviewed. According to the chart he was more psychotic this morning. This evening he seems a little bit more sedated. His thoughts are still a little bit confused but he was not hostile or threatening. He tells me he feels a little tired and couldn't figure out why that was. I pointed out to him that he had been quite sick recently. Patient is still very focused on worrying about whether his act team is aware of his condition. I was able to reassure him that they were. Patient's creatinine and lithium level are only slightly better than they were yesterday. Certainly he is not in any condition to go back on lithium at this point whether he ever will be or not is unclear.  This is a follow-up note for Thursday the 14th. Patient was seen in his room this evening. His mental status appeared to be different than what I have seen the last couple days. He appeared to be very edgy and irritable. Once I engaged him in conversation he began ranting and was quite angry for several minutes.  Multiple times he had to catch himself and clarify that he was not angry at me but then would continue on a disorganized Ray aunt about how angry he was at the hospital and the world in general. Much of it didn't make much sense. Quite disorganized and paranoid. Didn't make any specific threats. He seemed to be angry about the idea of being discharged tomorrow. Looked at his labs. His creatinine continues to come down. There hasn't been a new lithium level done today.  HPI:  Patient seen chart reviewed including current notes, other psych Cole from the weekend, past psychiatric notes. This is a 51 year old man with a history of schizophrenia or schizoaffective disorder who has a long history of somewhat difficult to manage psychosis and agitation. He presented to the hospital with lithium toxicity in kidney failure with very high lithium levels. Patient has received dialysis a couple of times and had serial checks on his lithium level which is now down to 1.1 with his kidney function improving. His mental status today he is wide awake and a little bit hyperactive and labile. He is somewhat agitated or at least a little hyperverbal. Didn't make any threats but gets derailed pretty easily and has a little paranoia to his thinking. His understanding of the details of his medical condition are shaky at best. I spoke with case management as well. Patient is currently living independently  Currently living independently apparently he's been there for only a short time maybe a few months. He has a brand-new act team because he switched care recently so  they are not as familiar with him. Family does not appear to be involved.  Does not drink or abuse drugs  Patient appears to be recovering kidney function rapidly. Creatinine is down to under 3 today after being as high as 11 when he presented.  Past Psychiatric History: Patient has a history of agitation and aggression and difficult to control behavior and  violence when he is psychotic. When the past we have seen him even when he was on full medicines with behaviors that required seclusion and hospitalization. Spoke today with care management. It's not clear how well he is going to tolerate living independently at all given his past history. No history of suicide been on clozapine and lithium probably for a couple of years at least on a regular basis clozapine on and off for a long time same with the lithium  Risk to Self: Is patient at risk for suicide?: No Risk to Others:   Prior Inpatient Therapy:   Prior Outpatient Therapy:    Past Medical History: History reviewed. No pertinent past medical history. History reviewed. No pertinent surgical history. Family History: History reviewed. No pertinent family history. Family Psychiatric  History: None known Social History:  History  Alcohol use Not on file     History  Drug use: Unknown    Social History   Social History  . Marital status: Single    Spouse name: N/A  . Number of children: N/A  . Years of education: N/A   Social History Main Topics  . Smoking status: Former Research scientist (life sciences)  . Smokeless tobacco: Never Used  . Alcohol use None  . Drug use: Unknown  . Sexual activity: Not Asked   Other Topics Concern  . None   Social History Narrative  . None   Additional Social History:    Allergies:  No Known Allergies  Labs:  Results for orders placed or performed during the hospital encounter of 11/05/16 (from the past 48 hour(s))  Lithium level     Status: None   Collection Time: 11/09/16  3:27 AM  Result Value Ref Range   Lithium Lvl 0.92 0.60 - 1.20 mmol/L  Renal function panel     Status: Abnormal   Collection Time: 11/09/16  3:27 AM  Result Value Ref Range   Sodium 142 135 - 145 mmol/L   Potassium 4.5 3.5 - 5.1 mmol/L   Chloride 110 101 - 111 mmol/L   CO2 28 22 - 32 mmol/L   Glucose, Bld 105 (H) 65 - 99 mg/dL   BUN 22 (H) 6 - 20 mg/dL   Creatinine, Ser 2.34 (H) 0.61 -  1.24 mg/dL   Calcium 9.0 8.9 - 10.3 mg/dL   Phosphorus 3.3 2.5 - 4.6 mg/dL   Albumin 3.1 (L) 3.5 - 5.0 g/dL   GFR calc non Af Amer 31 (L) >60 mL/min   GFR calc Af Amer 36 (L) >60 mL/min    Comment: (NOTE) The eGFR has been calculated using the CKD EPI equation. This calculation has not been validated in all clinical situations. eGFR's persistently <60 mL/min signify possible Chronic Kidney Disease.    Anion gap 4 (L) 5 - 15  Clozapine (clozaril)     Status: Abnormal   Collection Time: 11/09/16  3:27 AM  Result Value Ref Range   Clozapine Lvl 329 (L) 350 - 650 ng/mL    Comment:               **Please note reference interval change**  NorClozapine 91 Not Estab. ng/mL   Total(Cloz+Norcloz) 420 ng/mL    Comment: (NOTE) Patients dosed with 400 mg clozapine daily for 4 weeks were most likely to exhibit a therapeutic effect when the sum of clozapine and norclozapine concentrations were at least 450 ng/mL. Vira Agar, et al. Rexford Maus Consensus Guidelines for Therapeutic Drug Monitoring in Psychiatry: Update 2011, Pharmacopsychiatry Sep 2011; 44(6):195-235.                                Detection Limit = 20 Performed At: [facility] [place], [place] [id number] Lindon Romp MD VH:[id number]   Basic metabolic panel     Status: Abnormal   Collection Time: 11/10/16  7:23 AM  Result Value Ref Range   Sodium 143 135 - 145 mmol/L   Potassium 4.7 3.5 - 5.1 mmol/L   Chloride 114 (H) 101 - 111 mmol/L   CO2 26 22 - 32 mmol/L   Glucose, Bld 98 65 - 99 mg/dL   BUN 19 6 - 20 mg/dL   Creatinine, Ser 1.99 (H) 0.61 - 1.24 mg/dL   Calcium 9.3 8.9 - 10.3 mg/dL   GFR calc non Af Amer 38 (L) >60 mL/min   GFR calc Af Amer 44 (L) >60 mL/min    Comment: (NOTE) The eGFR has been calculated using the CKD EPI equation. This calculation has not been validated in all clinical situations. eGFR's persistently <60 mL/min signify possible Chronic  Kidney Disease.    Anion gap 3 (L) 5 - 15    Current Facility-Administered Medications  Medication Dose Route Frequency Provider Last Rate Last Dose  . acetaminophen (TYLENOL) tablet 650 mg  650 mg Oral Q6H PRN Idelle Crouch, MD   650 mg at 11/08/16 1114   Or  . acetaminophen (TYLENOL) suppository 650 mg  650 mg Rectal Q6H PRN Idelle Crouch, MD      . aspirin EC tablet 81 mg  81 mg Oral Daily Idelle Crouch, MD   81 mg at 11/10/16 1056  . bisacodyl (DULCOLAX) suppository 10 mg  10 mg Rectal Daily PRN Idelle Crouch, MD      . clonazePAM Bobbye Charleston) tablet 0.5 mg  0.5 mg Oral TID Clapacs, Madie Reno, MD   0.5 mg at 11/10/16 1638  . cloZAPine (CLOZARIL) tablet 300 mg  300 mg Oral QHS Pucilowska, Jolanta B, MD   300 mg at 11/09/16 2123  . divalproex (DEPAKOTE) DR tablet 500 mg  500 mg Oral Q12H Clapacs, John T, MD      . docusate sodium (COLACE) capsule 100 mg  100 mg Oral BID Idelle Crouch, MD   100 mg at 11/10/16 1056  . famotidine (PEPCID) tablet 20 mg  20 mg Oral Daily Idelle Crouch, MD   20 mg at 11/10/16 1056  . heparin injection 5,000 Units  5,000 Units Subcutaneous Q8H Idelle Crouch, MD   5,000 Units at 11/10/16 1427  . LORazepam (ATIVAN) injection 0.5 mg  0.5 mg Intravenous Q4H PRN Idelle Crouch, MD   0.5 mg at 11/09/16 2302  . ondansetron (ZOFRAN) tablet 4 mg  4 mg Oral Q6H PRN Idelle Crouch, MD       Or  . ondansetron (ZOFRAN) injection 4 mg  4 mg Intravenous Q6H PRN Idelle Crouch, MD      . perphenazine (TRILAFON) tablet 4 mg  4  mg Oral QHS Clapacs, Madie Reno, MD   4 mg at 11/09/16 2219    Musculoskeletal: Strength & Muscle Tone: within normal limits Gait & Station: normal Patient leans: N/A  Psychiatric Specialty Exam: Physical Exam  Nursing note and vitals reviewed. Constitutional: He appears well-developed and well-nourished.  HENT:  Head: Normocephalic and atraumatic.  Eyes: Conjunctivae are normal. Pupils are equal, round, and reactive  to light.  Neck: Normal range of motion.  Cardiovascular: Regular rhythm and normal heart sounds.   Respiratory: Effort normal. No respiratory distress.  GI: Soft.  Musculoskeletal: Normal range of motion.  Neurological: He is alert.  Skin: Skin is warm and dry.  Psychiatric: His affect is labile. His speech is tangential. His speech is not rapid and/or pressured. He is not agitated and not aggressive. Thought content is paranoid. Cognition and memory are impaired. He expresses impulsivity. He expresses no homicidal and no suicidal ideation. He exhibits abnormal recent memory.    Review of Systems  Constitutional: Negative.   HENT: Negative.   Eyes: Negative.   Respiratory: Negative.   Cardiovascular: Negative.   Gastrointestinal: Negative.   Musculoskeletal: Negative.   Skin: Negative.   Neurological: Negative.   Psychiatric/Behavioral: Positive for memory loss. Negative for depression, hallucinations, substance abuse and suicidal ideas. The patient is nervous/anxious and has insomnia.     Blood pressure (!) 156/104, pulse 87, temperature 98.6 F (37 C), temperature source Oral, resp. rate 19, height '5\' 9"'$  (1.753 m), weight 75.5 kg (166 lb 7.2 oz), SpO2 100 %.Body mass index is 24.58 kg/m.  General Appearance: Fairly Groomed  Eye Contact:  Good  Speech:  Pressured  Volume:  Increased  Mood:  Irritable  Affect:  Labile  Thought Process:  Disorganized  Orientation:  Full (Time, Place, and Person)  Thought Content:  Paranoid Ideation and Tangential  Suicidal Thoughts:  No  Homicidal Thoughts:  No  Memory:  Immediate;   Fair Recent;   Fair Remote;   Fair  Judgement:  Impaired  Insight:  Shallow  Psychomotor Activity:  Decreased  Concentration:  Concentration: Fair  Recall:  [employer] of Knowledge:  Fair  Language:  Fair  Akathisia:  No  Handed:  Right  AIMS (if indicated):     Assets:  Desire for Improvement Financial Resources/Insurance Housing Physical  Health Resilience  ADL's:  Intact  Cognition:  WNL  Sleep:        Treatment Plan Summary: Daily contact with patient to assess and evaluate symptoms and progress in treatment, Medication management and Plan Continue off of the lithium but on his clozapine. Added a little bit of Klonopin yesterday to help with the agitation. He hasn't had any aggressive or really remarkably bad behavior. Reassured him that the treatment team is working hard on taking care of all of his needs. No change to medicine for today  Disposition: No evidence of imminent risk to self or others at present.   Patient does not meet criteria for psychiatric inpatient admission. Supportive therapy provided about ongoing stressors.  Alethia Berthold, MD 11/10/2016 9:14 PM

## 2016-11-10 NOTE — Progress Notes (Signed)
Patient ID: Nicholas Cole, male   DOB: 08/23/1965, 51 y.o.   MRN: [id number]     [facility] PROGRESS NOTE  Nicholas Cole JQB:[id number] DOB: [birth year] DOA: 11/05/2016 PCP: Lorelee Market, MD  HPI/Subjective: Patient feeling a little bit better. Still has a little bit of a tremor. Patient states that he is not leaving the hospital until member of his Act team come and visits him.  Objective: Vitals:   11/10/16 0538 11/10/16 1300  BP: 122/82 125/78  Pulse: 74 77  Resp:  19  Temp: 97.8 F (36.6 C) 98 F (36.7 C)    Filed Weights   11/08/16 0500 11/09/16 0500 11/10/16 0500  Weight: 74.6 kg (164 lb 7.4 oz) 75.3 kg (166 lb 0.1 oz) 75.5 kg (166 lb 7.2 oz)    ROS: Review of Systems  Constitutional: Negative for chills and fever.  Eyes: Negative for blurred vision.  Respiratory: Negative for cough and shortness of breath.   Cardiovascular: Negative for chest pain.  Gastrointestinal: Negative for abdominal pain, constipation, diarrhea, nausea and vomiting.  Genitourinary: Negative for dysuria.  Musculoskeletal: Negative for joint pain.  Neurological: Positive for tremors and weakness. Negative for dizziness and headaches.   Exam: Physical Exam  HENT:  Nose: No mucosal edema.  Mouth/Throat: No oropharyngeal exudate or posterior oropharyngeal edema.  Eyes: Conjunctivae, EOM and lids are normal. Pupils are equal, round, and reactive to light.  Neck: No JVD present. Carotid bruit is not present. No edema present. No thyroid mass and no thyromegaly present.  Cardiovascular: S1 normal and S2 normal.  Exam reveals no gallop.   No murmur heard. Pulses:      Dorsalis pedis pulses are 2+ on the right side, and 2+ on the left side.  Respiratory: No respiratory distress. He has no wheezes. He has no rhonchi. He has no rales.  GI: Soft. Bowel sounds are normal. There is no tenderness.  Musculoskeletal:       Right ankle: He exhibits no swelling.       Left ankle: He exhibits  no swelling.  Lymphadenopathy:    He has no cervical adenopathy.  Neurological: He is alert. No cranial nerve deficit.  Patient has a barely noticeable tremor  Skin: Skin is warm. No rash noted. Nails show no clubbing.  Psychiatric: He has a normal mood and affect.      Data Reviewed: Basic Metabolic Panel:  Recent Labs Lab 11/05/16 2351 11/06/16 0358 11/06/16 1635 11/07/16 0312 11/08/16 0442 11/09/16 0327 11/10/16 0723  NA 132* 133*  --  144 141 142 143  K 3.1* 2.7*  --  3.8 4.2 4.5 4.7  CL 87* 90*  --  111 106 110 114*  CO2 31 31  --  26 31 28 26   GLUCOSE 141* 119*  --  133* 103* 105* 98  BUN 77* 79*  --  53* 26* 22* 19  CREATININE 10.16* 9.51*  --  4.84* 2.52* 2.34* 1.99*  CALCIUM 8.6* 8.3*  --  9.3 9.0 9.0 9.3  MG 2.3  --   --   --   --   --   --   PHOS 6.4*  --  2.3*  --   --  3.3  --    Liver Function Tests:  Recent Labs Lab 11/05/16 1857 11/06/16 0358 11/09/16 0327  AST 30 24  --   ALT 13* 12*  --   ALKPHOS 136* 104  --   BILITOT 1.3* 0.8  --   PROT  7.7 6.2*  --   ALBUMIN 4.8 3.7 3.1*    Recent Labs Lab 11/05/16 1857  LIPASE 82*   CBC:  Recent Labs Lab 11/05/16 1857 11/06/16 0358  WBC 12.7* 13.4*  NEUTROABS 11.7*  --   HGB 12.0* 10.8*  HCT 35.7* 32.1*  MCV 85.6 86.6  PLT 188 174   Cardiac Enzymes:  Recent Labs Lab 11/05/16 1857 11/06/16 0358 11/07/16 0312 11/08/16 0442  CKTOTAL 700* 983* 766* 326  TROPONINI <0.03  --   --   --     Scheduled Meds: . aspirin EC  81 mg Oral Daily  . clonazePAM  0.5 mg Oral TID  . cloZAPine  300 mg Oral QHS  . docusate sodium  100 mg Oral BID  . famotidine  20 mg Oral Daily  . heparin  5,000 Units Subcutaneous Q8H  . perphenazine  4 mg Oral QHS    Assessment/Plan:  1. Lithium toxicity. On admission with hemoglobin level was 2.7. Yesterday it was 0.9. Normal range is 0-1.2. Lithium is held.  Patient had 2 dialysis sessions.  2. Acute kidney injury on chronic kidney disease stage 2. IV fluid  hydration. Nephrology consultation. Renal ultrasound showed medical renal disease. Creatinine improved after dialysis. Monitor with holding dialysis. Creatinine down to 1.99. Potential discharge home tomorrow morning 3. Hypokalemia.  Replaced. 4. Rhabdomyolysis probably from fall. CPK improved with IV fluids  5. Schizoaffective disorder. Psychiatric consultation for medication management. Appreciate psychiatric consultation. Patient currently on clozapine and perphenazine and Klonopin.   Code Status:     Code Status Orders        Start     Ordered   11/05/16 2206  Full code  Continuous     11/05/16 2205    Code Status History    Date Active Date Inactive Code Status Order ID Comments User Context   This patient has a current code status but no historical code status.    Advance Directive Documentation     Most Recent Value  Type of Advance Directive  Healthcare Power of Attorney  Pre-existing out of facility DNR order (yellow form or pink MOST form)  -  "MOST" Form in Place?  -      Disposition Plan: Patient states he is not leaving the hospital until member of his ACT Team comes and visits.  Consultants:  Nephrology  Psychiatry  Time spent: 25 minutes  [place], [place]

## 2016-11-10 NOTE — Progress Notes (Signed)
Ch rounding unit visited pt. Pt appeared frustrated and angry that no one had come to help him use bathroom. Nicholas Cole talked to nurse and the nurse went in the Rm and helped the pt use bathroom. Ch to have a followup with the pt to provide spiritual care as needed.    11/10/16 1300  Clinical Encounter Type  Visited With Patient  Visit Type Initial;Spiritual support  Referral From [place];Other (Comment)

## 2016-11-10 NOTE — Clinical Social Work Note (Signed)
CSW contacted [place], patient's Care Coordinator, through [place] this morning: [phone]. CSW informed Beverely Pace that patient will more than likely be ready for discharge by tomorrow and that patient keeps telling the physician that he is not leaving the hospital until he speaks with Olivia Mackie. Beverely Pace is going to try and find out who Olivia Mackie is and Beverely Pace is on her way to see patient here at the hospital.  Shela Leff MSW,LCSW [phone]

## 2016-11-11 LAB — BASIC METABOLIC PANEL
Anion gap: 3 — ABNORMAL LOW (ref 5–15)
BUN: 20 mg/dL (ref 6–20)
CALCIUM: 9.3 mg/dL (ref 8.9–10.3)
CO2: 26 mmol/L (ref 22–32)
CREATININE: 1.92 mg/dL — AB (ref 0.61–1.24)
Chloride: 113 mmol/L — ABNORMAL HIGH (ref 101–111)
GFR calc Af Amer: 46 mL/min — ABNORMAL LOW (ref >60)
GFR, EST NON AFRICAN AMERICAN: 39 mL/min — AB (ref >60)
GLUCOSE: 115 mg/dL — AB (ref 65–99)
Potassium: 4.6 mmol/L (ref 3.5–5.1)
Sodium: 142 mmol/L (ref 135–145)

## 2016-11-11 LAB — LITHIUM LEVEL: Lithium Lvl: 0.43 mmol/L — ABNORMAL LOW (ref 0.60–1.20)

## 2016-11-11 MED ORDER — CLONAZEPAM 0.5 MG PO TABS: 0.5000 mg | ORAL_TABLET | Freq: Three times a day (TID) | ORAL | 0 refills | Status: DC

## 2016-11-11 MED ORDER — CLOZAPINE 100 MG PO TABS: 300.0000 mg | ORAL_TABLET | Freq: Every day | ORAL | 0 refills | Status: DC

## 2016-11-11 MED ORDER — HALOPERIDOL LACTATE 5 MG/ML IJ SOLN
2.0000 mg | Freq: Once | INTRAMUSCULAR | Status: AC
  Administered 2016-11-11: 2 mg via INTRAVENOUS
  Filled 2016-11-11: qty 1

## 2016-11-11 MED ORDER — METOPROLOL SUCCINATE ER 25 MG PO TB24: 25.0000 mg | ORAL_TABLET | Freq: Every day | ORAL | 0 refills | Status: DC

## 2016-11-11 MED ORDER — PERPHENAZINE 4 MG PO TABS: 4.0000 mg | ORAL_TABLET | Freq: Every day | ORAL | 0 refills | Status: DC

## 2016-11-11 MED ORDER — DIVALPROEX SODIUM 500 MG PO DR TAB: 500.0000 mg | DELAYED_RELEASE_TABLET | Freq: Two times a day (BID) | ORAL | 0 refills | Status: DC

## 2016-11-11 MED ORDER — METOPROLOL SUCCINATE ER 25 MG PO TB24
25.0000 mg | ORAL_TABLET | Freq: Every day | ORAL | Status: DC
  Administered 2016-11-11: 25 mg via ORAL
  Filled 2016-11-11: qty 1

## 2016-11-11 NOTE — Discharge Summary (Signed)
[facility] NAME: Nicholas Cole    MR#:  [id number]  DATE OF BIRTH:  [birth year]  DATE OF ADMISSION:  11/05/2016 ADMITTING PHYSICIAN: Idelle Crouch, MD  DATE OF DISCHARGE: 11/11/2016  PRIMARY CARE PHYSICIAN: Lorelee Market, MD    ADMISSION DIAGNOSIS:  Metabolic acidosis [K99.8] ARF (acute renal failure) (HCC) [N17.9] Non-traumatic rhabdomyolysis [M62.82] Acute renal failure, unspecified acute renal failure type ([place]) [N17.9]  DISCHARGE DIAGNOSIS:  Principal Problem:   ARF (acute renal failure) (HCC) Active Problems:   Dehydration   Rhabdomyolysis   Schizoaffective disorder, bipolar type ([place])   Lithium toxicity     HOSPITAL COURSE:   1. Lithium toxicity. Patient's initial lithium level was 2.7. He required 2 dialysis sessions to dialyze off the lithium. Lithium level upon discharge 0.43. Lithium was held. The lithium is not a good medication for this patient. The patient's tremor may be chronic. Continue to monitor.  2. Acute kidney injury. Patient's creatinine on presentation was 11.34. Required 2 dialysis sessions secondary to lithium toxicity. Creatinine has improved to 1.92 upon discharge home. Looks like baseline creatinine back in 2015 was 1.27. I don't know if he'll get back to that point but I would want a nephrology follow-up as outpatient. 3. Rhabdomyolysis. The patient did have a fall as outpatient. He is a poor historian and unable to give much history. CPK improved with IV fluid hydration. 4. Schizoaffective disorder and bipolar disorder. Patient was seen in consultation by psychiatry. Lithium of course was held. Patient was started on Depakote. Continued on his Clazuril but does decrease to 3 tablets daily. His trilafon was increased to 4 mg. Will need Depakote levels as outpatient. Because of starting Depakote I stopped his aspirin. Klonopin added. 5. Initial hypotension. Can restart beta blocker upon discharge  Toprol-XL 25 mg daily hold on atenolol. 6. Tremor may be long-standing if this is secondary to chronic lithium I levels.  DISCHARGE CONDITIONS:   Fair  CONSULTS OBTAINED:  Treatment Team:  Anthonette Legato, MD Clovis Fredrickson, MD Schnier, Dolores Lory, MD Clapacs, Madie Reno, MD  DRUG ALLERGIES:  No Known Allergies  DISCHARGE MEDICATIONS:   Current Discharge Medication List    START taking these medications   Details  clonazePAM (KLONOPIN) 0.5 MG tablet Take 1 tablet (0.5 mg total) by mouth 3 (three) times daily. Qty: 90 tablet, Refills: 0    divalproex (DEPAKOTE) 500 MG DR tablet Take 1 tablet (500 mg total) by mouth every 12 (twelve) hours. Qty: 60 tablet, Refills: 0    metoprolol succinate (TOPROL-XL) 25 MG 24 hr tablet Take 1 tablet (25 mg total) by mouth daily. Qty: 30 tablet, Refills: 0      CONTINUE these medications which have CHANGED   Details  cloZAPine (CLOZARIL) 100 MG tablet Take 3 tablets (300 mg total) by mouth daily. Qty: 90 tablet, Refills: 0    perphenazine (TRILAFON) 4 MG tablet Take 1 tablet (4 mg total) by mouth at bedtime. Qty: 30 tablet, Refills: 0      CONTINUE these medications which have NOT CHANGED   Details  ranitidine (ZANTAC) 150 MG tablet Take 150 mg by mouth 2 (two) times daily.      STOP taking these medications     aspirin EC 81 MG tablet      atenolol (TENORMIN) 25 MG tablet      lithium carbonate 300 MG capsule          DISCHARGE INSTRUCTIONS:  Follow-up with psychiatrist as soon as possible. Follow-up with medical doctor one week Follow-up Dr. Rolly Salter nephrology 2 weeks  If you experience worsening of your admission symptoms, develop shortness of breath, life threatening emergency, suicidal or homicidal thoughts you must seek medical attention immediately by calling 911 or calling your MD immediately  if symptoms less severe.  You Must read complete instructions/literature along with all the possible adverse  reactions/side effects for all the Medicines you take and that have been prescribed to you. Take any new Medicines after you have completely understood and accept all the possible adverse reactions/side effects.   Please note  You were cared for by a hospitalist during your hospital stay. If you have any questions about your discharge medications or the care you received while you were in the hospital after you are discharged, you can call the unit and asked to speak with the hospitalist on call if the hospitalist that took care of you is not available. Once you are discharged, your primary care physician will handle any further medical issues. Please note that NO REFILLS for any discharge medications will be authorized once you are discharged, as it is imperative that you return to your primary care physician (or establish a relationship with a primary care physician if you do not have one) for your aftercare needs so that they can reassess your need for medications and monitor your lab values.    Today   CHIEF COMPLAINT:   Chief Complaint  Patient presents with  . Altered Mental Status    HISTORY OF PRESENT ILLNESS:  Nicholas Cole  is a 51 y.o. male brought in with altered mental status   VITAL SIGNS:  Blood pressure (!) 145/84, pulse 79, temperature 98.8 F (37.1 C), resp. rate 18, height 5\' 9"  (1.753 m), weight 75.5 kg (166 lb 7.2 oz), SpO2 100 %.    PHYSICAL EXAMINATION:  GENERAL:  51 y.o.-year-old patient lying in the bed with no acute distress.  EYES: Pupils equal, round, reactive to light and accommodation. No scleral icterus. Extraocular muscles intact.  HEENT: Head atraumatic, normocephalic. Oropharynx and nasopharynx clear.  NECK:  Supple, no jugular venous distention. No thyroid enlargement, no tenderness.  LUNGS: Normal breath sounds bilaterally, no wheezing, rales,rhonchi or crepitation. No use of accessory muscles of respiration.  CARDIOVASCULAR: S1, S2 normal. No  murmurs, rubs, or gallops.  ABDOMEN: Soft, non-tender, non-distended. Bowel sounds present. No organomegaly or mass.  EXTREMITIES: No pedal edema, cyanosis, or clubbing.  NEUROLOGIC: Cranial nerves II through XII are intact. Muscle strength 5/5 in all extremities. Sensation intact. Gait not checked.  PSYCHIATRIC: The patient is alert and oriented x 3.  SKIN: No obvious rash, lesion, or ulcer.   DATA REVIEW:   CBC  Recent Labs Lab 11/06/16 0358  WBC 13.4*  HGB 10.8*  HCT 32.1*  PLT 174    Chemistries   Recent Labs Lab 11/05/16 2351 11/06/16 0358  11/11/16 0636  NA 132* 133*  < > 142  K 3.1* 2.7*  < > 4.6  CL 87* 90*  < > 113*  CO2 31 31  < > 26  GLUCOSE 141* 119*  < > 115*  BUN 77* 79*  < > 20  CREATININE 10.16* 9.51*  < > 1.92*  CALCIUM 8.6* 8.3*  < > 9.3  MG 2.3  --   --   --   AST  --  24  --   --   ALT  --  12*  --   --  ALKPHOS  --  104  --   --   BILITOT  --  0.8  --   --   < > = values in this interval not displayed.  Cardiac Enzymes  Recent Labs Lab 11/05/16 1857  TROPONINI <0.03    Management plans discussed with the patient, family and they are in agreement.  CODE STATUS:     Code Status Orders        Start     Ordered   11/05/16 2206  Full code  Continuous     11/05/16 2205    Code Status History    Date Active Date Inactive Code Status Order ID Comments User Context   This patient has a current code status but no historical code status.    Advance Directive Documentation     Most Recent Value  Type of Advance Directive  Healthcare Power of Attorney  Pre-existing out of facility DNR order (yellow form or pink MOST form)  -  "MOST" Form in Place?  -      TOTAL TIME TAKING CARE OF THIS PATIENT: 32 minutes.    Loletha [facility] M.D on 11/11/2016 at 9:08 AM  Between 7am to 6pm - Pager - [phone]  After 6pm go to [email or web address] - password [email or web address] [facility]  [phone]  CC: Primary care physician;  Lorelee Market, MD

## 2016-11-11 NOTE — Progress Notes (Signed)
Chaplain made a follow-up visit with pt, but was not able to speak to pt because pt was asleep at the time of this visit. Chaplain talked with pt's nurse who indicated that the pt was to be discharged today. Pt offered silent prayer and ministry of presence.    11/11/16 1400  Clinical Encounter Type  Visited With Patient  Visit Type Follow-up;Spiritual support  Referral From [place];Other (Comment)

## 2016-11-11 NOTE — Clinical Social Work Note (Signed)
CSW has left message for Colletta [place] with the [facility] team to inform that patient is ready for discharge and scripts are ready with the changes in medications. Shela Leff MSW,LCSW [phone]

## 2016-11-11 NOTE — Progress Notes (Signed)
Pt to be discharged per MD order. Instructions reviewed with pt and will provide proper information to [facility] rep [facility]. New scripts in chart. Transportation will be provided by [facility].

## 2016-11-11 NOTE — Care Management (Signed)
No home health needs at discharge.  RNCM signing off

## 2016-11-11 NOTE — Care Management Important Message (Signed)
Important Message  Patient Details  Name: Nicholas Cole MRN: [id number] Date of Birth: 07/01/1965   Medicare Important Message Given:  Yes    Beverly Sessions, RN 11/11/2016, 10:08 AM

## 2016-11-11 NOTE — Care Management (Signed)
Stephanie from [facility] Team informed CSW patient would have daily visits for at least a week.  It would probably be very confusing and upsetting to patient's mental state if a home health nurse - whom he does not know- is added to the treatment regimen. Anticipate discharge today and ACT will transport home

## 2016-11-14 ENCOUNTER — Emergency Department
Admission: EM | Admit: 2016-11-14 | Discharge: 2016-11-15 | Disposition: A | Discharge status: Home / Self Care | Payer: Medicare Other | Attending: Emergency Medicine | Admitting: Emergency Medicine

## 2016-11-14 DIAGNOSIS — N289 Disorder of kidney and ureter, unspecified: Secondary | ICD-10-CM | POA: Diagnosis not present

## 2016-11-14 DIAGNOSIS — F250 Schizoaffective disorder, bipolar type: Secondary | ICD-10-CM | POA: Diagnosis present

## 2016-11-14 DIAGNOSIS — F209 Schizophrenia, unspecified: Secondary | ICD-10-CM | POA: Insufficient documentation

## 2016-11-14 DIAGNOSIS — R462 Strange and inexplicable behavior: Secondary | ICD-10-CM | POA: Diagnosis present

## 2016-11-14 DIAGNOSIS — Z87891 Personal history of nicotine dependence: Secondary | ICD-10-CM | POA: Diagnosis not present

## 2016-11-14 DIAGNOSIS — E860 Dehydration: Secondary | ICD-10-CM | POA: Diagnosis present

## 2016-11-14 DIAGNOSIS — Z79899 Other long term (current) drug therapy: Secondary | ICD-10-CM | POA: Insufficient documentation

## 2016-11-14 LAB — COMPREHENSIVE METABOLIC PANEL
ALK PHOS: 92 U/L (ref 38–126)
ALT: 26 U/L (ref 17–63)
ANION GAP: 4 — AB (ref 5–15)
AST: 18 U/L (ref 15–41)
Albumin: 3.8 g/dL (ref 3.5–5.0)
BILIRUBIN TOTAL: 0.6 mg/dL (ref 0.3–1.2)
BUN: 12 mg/dL (ref 6–20)
CALCIUM: 9.1 mg/dL (ref 8.9–10.3)
CO2: 26 mmol/L (ref 22–32)
Chloride: 111 mmol/L (ref 101–111)
Creatinine, Ser: 2.19 mg/dL — ABNORMAL HIGH (ref 0.61–1.24)
GFR calc non Af Amer: 34 mL/min — ABNORMAL LOW (ref >60)
GFR, EST AFRICAN AMERICAN: 39 mL/min — AB (ref >60)
Glucose, Bld: 97 mg/dL (ref 65–99)
Potassium: 4.8 mmol/L (ref 3.5–5.1)
SODIUM: 141 mmol/L (ref 135–145)
TOTAL PROTEIN: 6.7 g/dL (ref 6.5–8.1)

## 2016-11-14 LAB — LITHIUM LEVEL: LITHIUM LVL: 0.22 mmol/L — AB (ref 0.60–1.20)

## 2016-11-14 LAB — CBC
HCT: 34.5 % — ABNORMAL LOW (ref 40.0–52.0)
Hemoglobin: 11.2 g/dL — ABNORMAL LOW (ref 13.0–18.0)
MCH: 28.6 pg (ref 26.0–34.0)
MCHC: 32.6 g/dL (ref 32.0–36.0)
MCV: 87.7 fL (ref 80.0–100.0)
PLATELETS: 332 10*3/uL (ref 150–440)
RBC: 3.94 MIL/uL — ABNORMAL LOW (ref 4.40–5.90)
RDW: 13.1 % (ref 11.5–14.5)
WBC: 6.3 10*3/uL (ref 3.8–10.6)

## 2016-11-14 LAB — URINE DRUG SCREEN, QUALITATIVE (ARMC ONLY)
Amphetamines, Ur Screen: NOT DETECTED
BARBITURATES, UR SCREEN: NOT DETECTED
BENZODIAZEPINE, UR SCRN: NOT DETECTED
Cannabinoid 50 Ng, Ur ~~LOC~~: NOT DETECTED
Cocaine Metabolite,Ur ~~LOC~~: NOT DETECTED
MDMA (Ecstasy)Ur Screen: NOT DETECTED
METHADONE SCREEN, URINE: NOT DETECTED
Opiate, Ur Screen: NOT DETECTED
Phencyclidine (PCP) Ur S: NOT DETECTED
TRICYCLIC, UR SCREEN: NOT DETECTED

## 2016-11-14 LAB — ACETAMINOPHEN LEVEL: Acetaminophen (Tylenol), Serum: <10 ug/mL — ABNORMAL LOW (ref 10–30)

## 2016-11-14 LAB — URINALYSIS, ROUTINE W REFLEX MICROSCOPIC
Bilirubin Urine: NEGATIVE
Glucose, UA: NEGATIVE mg/dL
Hgb urine dipstick: NEGATIVE
KETONES UR: NEGATIVE mg/dL
LEUKOCYTES UA: NEGATIVE
NITRITE: NEGATIVE
PROTEIN: NEGATIVE mg/dL
Specific Gravity, Urine: 1.006 (ref 1.005–1.030)
pH: 7 (ref 5.0–8.0)

## 2016-11-14 LAB — CK: Total CK: 94 U/L (ref 49–397)

## 2016-11-14 LAB — ETHANOL: Alcohol, Ethyl (B): <5 mg/dL (ref <5)

## 2016-11-14 LAB — SALICYLATE LEVEL

## 2016-11-14 MED ORDER — PERPHENAZINE 4 MG PO TABS
4.0000 mg | ORAL_TABLET | Freq: Every day | ORAL | Status: DC
  Administered 2016-11-15: 4 mg via ORAL
  Filled 2016-11-14 (×2): qty 1

## 2016-11-14 MED ORDER — ZIPRASIDONE MESYLATE 20 MG IM SOLR
INTRAMUSCULAR | Status: AC
  Filled 2016-11-14: qty 20

## 2016-11-14 MED ORDER — FAMOTIDINE 20 MG PO TABS
20.0000 mg | ORAL_TABLET | Freq: Two times a day (BID) | ORAL | Status: DC
  Administered 2016-11-14 – 2016-11-15 (×2): 20 mg via ORAL
  Filled 2016-11-14 (×2): qty 1

## 2016-11-14 MED ORDER — CLOZAPINE 100 MG PO TABS: 300.0000 mg | ORAL_TABLET | Freq: Every day | ORAL | Status: DC

## 2016-11-14 MED ORDER — DIVALPROEX SODIUM 500 MG PO DR TAB
500.0000 mg | DELAYED_RELEASE_TABLET | Freq: Two times a day (BID) | ORAL | Status: DC
  Administered 2016-11-14 – 2016-11-15 (×2): 500 mg via ORAL
  Filled 2016-11-14 (×2): qty 1

## 2016-11-14 MED ORDER — METOPROLOL SUCCINATE ER 50 MG PO TB24
25.0000 mg | ORAL_TABLET | Freq: Every day | ORAL | Status: DC
  Administered 2016-11-15: 25 mg via ORAL
  Filled 2016-11-14: qty 1

## 2016-11-14 MED ORDER — CLONAZEPAM 0.5 MG PO TABS
0.5000 mg | ORAL_TABLET | Freq: Three times a day (TID) | ORAL | Status: DC
  Administered 2016-11-14 – 2016-11-15 (×2): 0.5 mg via ORAL
  Filled 2016-11-14 (×2): qty 1

## 2016-11-14 MED ORDER — ZIPRASIDONE MESYLATE 20 MG IM SOLR
20.0000 mg | Freq: Once | INTRAMUSCULAR | Status: AC
  Administered 2016-11-14: 19:00:00 via INTRAMUSCULAR

## 2016-11-14 NOTE — ED Notes (Signed)
Pt given milk 

## 2016-11-14 NOTE — ED Notes (Signed)
Pt given meal tray and sprite by [place] [facility]

## 2016-11-14 NOTE — ED Notes (Signed)
Pts scrubs were wet.  Given clean dry scrubs and underwear

## 2016-11-14 NOTE — ED Notes (Signed)
Pt requesting home medications

## 2016-11-14 NOTE — ED Notes (Signed)
Pt asked for toothbrush/toothpaste.  Provided for him.  Pt ambulates to bathroom.  No complaints at this time

## 2016-11-14 NOTE — ED Notes (Signed)
Pt given sprite w/ no ice per request

## 2016-11-14 NOTE — ED Triage Notes (Signed)
Pt arrives to ER with "Nicholas Cole" who works with the crisis team. Pt lives alone, pt gets evaluated frequently. Was found with urine in bed, disheveled. Denies SI or HI.

## 2016-11-14 NOTE — ED Notes (Signed)
Pt up to BR w/o issue

## 2016-11-14 NOTE — ED Provider Notes (Signed)
[facility] Emergency Department Provider Note  ____________________________________________   First MD Initiated Contact with Patient 11/14/16 1810     (approximate)  I have reviewed the triage vital signs and the nursing notes.   HISTORY  Chief Complaint Psychiatric Evaluation  Level 5 caveat:  history/ROS limited by active psychosis / mental illness / altered mental status   HPI Rockie Schnoor is a 51 y.o. male with extensive psychiatric history including a mobile crisis team.  He was recently admitted to the hospital with severe acute renal failure thought to be secondary to unintentional lithium overdose as well as rhabdomyolysis.  He showed significant improvement throughout his hospital course was discharged about 3 days ago.  He came to the emergency department today with his mobile crisis unit who is very concerned about his current mental state.  They feel he has been decompensating and acting erratically. He was apparently disheveled and covered in urine when they went to his house.  She brought him for psychiatric evaluation.  He is speaking loudly with pressured speech but I do not know what is his baseline.  He states that he feels much better since getting out of the hospital and has been taking his medication.  He remembered correctly that he is taking clonidine and not lithium (which I verified from a prior note by Dr. Weber Cooks who followed along during the patient's admission).  He denies suicidal ideation and homicidal ideation.  He seems somewhat agitated and pressured but is not acutely violent upon initial evaluation.  He denies fever/chills, chest pain, shortness of breath, nausea, vomiting, abdominal pain.  He agrees to being evaluated to "make sure I am okay".  History reviewed. No pertinent past medical history.  Patient Active Problem List   Diagnosis Date Noted  . Lithium toxicity 11/06/2016  . ARF (acute renal failure) ([place])  11/05/2016  . Dehydration 11/05/2016  . Rhabdomyolysis 11/05/2016  . Schizoaffective disorder, bipolar type ([place]) 11/05/2016    History reviewed. No pertinent surgical history.  Prior to Admission medications   Medication Sig Start Date End Date Taking? Authorizing Provider  clonazePAM (KLONOPIN) 0.5 MG tablet Take 1 tablet (0.5 mg total) by mouth 3 (three) times daily. 11/11/16   Loletha [facility], MD  cloZAPine (CLOZARIL) 100 MG tablet Take 3 tablets (300 mg total) by mouth daily. 11/11/16   Loletha [facility], MD  divalproex (DEPAKOTE) 500 MG DR tablet Take 1 tablet (500 mg total) by mouth every 12 (twelve) hours. 11/11/16   Loletha [facility], MD  metoprolol succinate (TOPROL-XL) 25 MG 24 hr tablet Take 1 tablet (25 mg total) by mouth daily. 11/11/16   Loletha [facility], MD  perphenazine (TRILAFON) 4 MG tablet Take 1 tablet (4 mg total) by mouth at bedtime. 11/11/16   Loletha [facility], MD  ranitidine (ZANTAC) 150 MG tablet Take 150 mg by mouth 2 (two) times daily.    [provider]    Allergies Patient has no known allergies.  No family history on file.  Social History Social History  Substance Use Topics  . Smoking status: Former Research scientist (life sciences)  . Smokeless tobacco: Never Used  . Alcohol use Not on file    Review of Systems Constitutional: No fever/chills Eyes: No visual changes. ENT: No sore throat. Cardiovascular: Denies chest pain. Respiratory: Denies shortness of breath. Gastrointestinal: No abdominal pain.  No nausea, no vomiting.  No diarrhea.  No constipation. Genitourinary: Negative for dysuria. Musculoskeletal: Negative for neck pain.  Chronic back pain. Integumentary: Negative for rash.  Neurological: Negative for headaches, focal weakness or numbness.   ____________________________________________   PHYSICAL EXAM:  VITAL SIGNS: ED Triage Vitals  Enc Vitals Group     BP 11/14/16 1739 129/80     Pulse Rate 11/14/16 1739 84     Resp 11/14/16 1739 18     Temp  11/14/16 1739 97.8 F (36.6 C)     Temp Source 11/14/16 1739 Oral     SpO2 11/14/16 1739 100 %     Weight 11/14/16 1741 74.8 kg (165 lb)     Height 11/14/16 1741 1.753 m (5\' 9" )     Head Circumference --      Peak Flow --      Pain Score 11/14/16 1739 0     Pain Loc --      Pain Edu? --      Excl. in [place]? --     Constitutional: Alert. No acute distress.   Eyes: Conjunctivae are normal.  Head: Atraumatic. Nose: No congestion/rhinnorhea. Mouth/Throat: Mucous membranes are moist. Neck: No stridor.  No meningeal signs.   Cardiovascular: Normal rate, regular rhythm. Good peripheral circulation. Grossly normal heart sounds. Respiratory: Normal respiratory effort.  No retractions. Lungs CTAB. Gastrointestinal: Soft and nontender. No distention.  Musculoskeletal: No lower extremity tenderness nor edema. No gross deformities of extremities. Neurologic:  Normal speech and language. No gross focal neurologic deficits are appreciated.  Skin:  Skin is warm, dry and intact. No rash noted. Psychiatric: Pressured and somewhat rapid speech that is a bit tangential, but this may be his baseline.  No acute aggression or violent upon initial presentation. ____________________________________________   LABS (all labs ordered are listed, but only abnormal results are displayed)  Labs Reviewed  COMPREHENSIVE METABOLIC PANEL - Abnormal; Notable for the following:       Result Value   Creatinine, Ser 2.19 (*)    GFR calc non Af Amer 34 (*)    GFR calc Af Amer 39 (*)    Anion gap 4 (*)    All other components within normal limits  ACETAMINOPHEN LEVEL - Abnormal; Notable for the following:    Acetaminophen (Tylenol), Serum <10 (*)    All other components within normal limits  CBC - Abnormal; Notable for the following:    RBC 3.94 (*)    Hemoglobin 11.2 (*)    HCT 34.5 (*)    All other components within normal limits  URINALYSIS, ROUTINE W REFLEX MICROSCOPIC - Abnormal; Notable for the following:     Color, Urine STRAW (*)    APPearance CLEAR (*)    All other components within normal limits  LITHIUM LEVEL - Abnormal; Notable for the following:    Lithium Lvl 0.22 (*)    All other components within normal limits  ETHANOL  SALICYLATE LEVEL  URINE DRUG SCREEN, QUALITATIVE ([facility] ONLY)  CK   ____________________________________________  EKG  None - EKG not ordered by ED physician ____________________________________________  RADIOLOGY   No results found.  ____________________________________________   PROCEDURES  Critical Care performed: No   Procedure(s) performed:   Procedures   ____________________________________________   INITIAL IMPRESSION / ASSESSMENT AND PLAN / ED COURSE  Pertinent labs & imaging results that were available during my care of the patient were reviewed by me and considered in my medical decision making (see chart for details).  The patient has well-known and severe psychiatric disease and his mobile crisis unit was concerned enough about him to send him to the emergency department for evaluation.  He is in no acute distress and I will perform the usual medical workup but at this time he appears to have no acute medical issues in spite of his recent hospitalization.  I will place him under involuntary commitment if needed but at this point I will only do so if he becomes aggressive or violent.  Based on his baseline mental status, it is unclear to me if he lacks the capacity to make decisions for himself, but if his behavior deteriorates he certainly will not and I will be forced put him under IVC.   Clinical Course as of Nov 14 1940  Mon Nov 14, 2016  1906 The patient is becoming aggressive and started throwing his bed around the room.  For the safety of the patient himself and our staff, I am authorizing the administration of a calming agent in the form of Geodon 20 mg intramuscular.  [CF]  Gamaliel workup is reassuring.  Lithium level  is subtherapeutic which is appropriate given that he was taken off lithium during his last hospitalization.  His creatinine is 2.19 today which is very close to what it was at his hospital discharge 3 days ago.  I did place him under involuntary commitment due to his aggressive behavior and the necessity to administer a calming agent.  He is awaiting psychiatric evaluation.  [CF]    Clinical Course User Index [CF] Hinda Kehr, MD    ____________________________________________  FINAL CLINICAL IMPRESSION(S) / ED DIAGNOSES  Final diagnoses:  None     MEDICATIONS GIVEN DURING THIS VISIT:  Medications  ziprasidone (GEODON) 20 MG injection (  Given 11/14/16 1911)     NEW OUTPATIENT MEDICATIONS STARTED DURING THIS VISIT:  New Prescriptions   No medications on file    Modified Medications   No medications on file    Discontinued Medications   No medications on file     Note:  This document was prepared using Dragon voice recognition software and may include unintentional dictation errors.    Hinda Kehr, MD 11/14/16 [phone]

## 2016-11-14 NOTE — ED Notes (Signed)
Pt has continued to stand in doorway despite ED

## 2016-11-14 NOTE — ED Notes (Signed)
Patient has been medicated and is unable to participate with the assessor at this time.

## 2016-11-15 DIAGNOSIS — F250 Schizoaffective disorder, bipolar type: Secondary | ICD-10-CM

## 2016-11-15 DIAGNOSIS — F209 Schizophrenia, unspecified: Secondary | ICD-10-CM | POA: Diagnosis not present

## 2016-11-15 LAB — CBC WITH DIFFERENTIAL/PLATELET
BASOS PCT: 1 %
Basophils Absolute: 0 10*3/uL (ref 0–0.1)
EOS ABS: 0.1 10*3/uL (ref 0–0.7)
Eosinophils Relative: 1 %
HCT: 32.8 % — ABNORMAL LOW (ref 40.0–52.0)
HEMOGLOBIN: 10.6 g/dL — AB (ref 13.0–18.0)
LYMPHS ABS: 1 10*3/uL (ref 1.0–3.6)
Lymphocytes Relative: 17 %
MCH: 28.2 pg (ref 26.0–34.0)
MCHC: 32.4 g/dL (ref 32.0–36.0)
MCV: 87.3 fL (ref 80.0–100.0)
MONO ABS: 0.5 10*3/uL (ref 0.2–1.0)
MONOS PCT: 8 %
Neutro Abs: 4.1 10*3/uL (ref 1.4–6.5)
Neutrophils Relative %: 73 %
Platelets: 304 10*3/uL (ref 150–440)
RBC: 3.76 MIL/uL — ABNORMAL LOW (ref 4.40–5.90)
RDW: 13.2 % (ref 11.5–14.5)
WBC: 5.7 10*3/uL (ref 3.8–10.6)

## 2016-11-15 NOTE — ED Notes (Signed)
Am meds to be administered as ordered   Assessment completed  Doctor Mariea Clonts reports that the pt may move to the behavioral holding unit  - he is medically cleared   Psych consult pending

## 2016-11-15 NOTE — ED Notes (Addendum)
Pt given shampoo/soap, deodorant, toothpaste, toothbrush, clean clothing, towels and wash cloths. Pt in shower at this moment. Clean linen placed on bed.

## 2016-11-15 NOTE — ED Notes (Addendum)
Received a call from [facility] stating that they cannot accept him due to his labs are abnormal  - I informed MD Mariea Clonts  MD Mariea Clonts requests that the holding unit nurse please come and explain to her what the labs are that keep him from moving - I called the holding unit and spoke with Ulsa to inform them of the MD request    Pt continues to be in NAD  Happy - pleasant

## 2016-11-15 NOTE — BH Assessment (Signed)
Assessment Note  Nicholas Cole is an 51 y.o. male. Nicholas Cole states that he fell and hit his head yesterday.  He states that he has new medication and he is going to start them today.  He denied symptoms of depression.  He denied symptoms of anxiety.  He denied having auditory or visual hallucination.  He denied suicidal or homicidal ideation or intent.  He denied current stressors.  He denied the use of drugs or alcohol.   Patient arrived to the ED with a crisis team member.  The team member reported that Nicholas Cole was disheveled and had urine in his bed.  No additional information was provided.   Patient became agitated at the ED. IVC paperwork reports "Aggressive and destructive behavior in the ED. Inappropriate behaviors, crisis team concerned about psychosis"  Diagnosis: schizophrenia  Past Medical History: History reviewed. No pertinent past medical history.  History reviewed. No pertinent surgical history.  Family History: No family history on file.  Social History:  reports that he has quit smoking. He has never used smokeless tobacco. His alcohol and drug histories are not on file.  Additional Social History:  Alcohol / Drug Use History of alcohol / drug use?: No history of alcohol / drug abuse  CIWA: CIWA-Ar BP: 129/80 Pulse Rate: 84 COWS:    Allergies: No Known Allergies  Home Medications:  (Not in a hospital admission)  OB/GYN Status:  No LMP for male patient.  General Assessment Data Location of Assessment: [facility] ED TTS Assessment: In system Is this a Tele or Face-to-Face Assessment?: Face-to-Face Is this an Initial Assessment or a Re-assessment for this encounter?: Initial Assessment Marital status: Single Maiden name: n/a Is patient pregnant?: No Pregnancy Status: No Living Arrangements: Alone Can pt return to current living arrangement?: Yes Admission Status: Involuntary Is patient capable of signing voluntary admission?: Yes Referral Source:  Self/Family/Friend Insurance type: Medicare, Medicaid, UHC  Medical Screening Exam ([place]) Medical Exam completed: Yes  Crisis Care Plan Living Arrangements: Alone Legal Guardian: Other: (Self) Name of Psychiatrist: [facility] Name of Therapist: [facility]  Education Status Is patient currently in school?: No Current Grade: n/a Highest grade of school patient has completed: 8th Name of school: [employer] person: n/a  Risk to self with the past 6 months Suicidal Ideation: No Has patient been a risk to self within the past 6 months prior to admission? : No Suicidal Intent: No Has patient had any suicidal intent within the past 6 months prior to admission? : No Is patient at risk for suicide?: No Suicidal Plan?: No Has patient had any suicidal plan within the past 6 months prior to admission? : No Access to Means: No What has been your use of drugs/alcohol within the last 12 months?: none reported Previous Attempts/Gestures: No How many times?: 0 Other Self Harm Risks: denied Triggers for Past Attempts: None known Intentional Self Injurious Behavior: None Family Suicide History: No Recent stressful life event(s): Other (Comment) (Denied) Persecutory voices/beliefs?: No Depression: No Depression Symptoms:  (None reported) Substance abuse history and/or treatment for substance abuse?: No Suicide prevention information given to non-admitted patients: Not applicable  Risk to Others within the past 6 months Homicidal Ideation: No Does patient have any lifetime risk of violence toward others beyond the six months prior to admission? : No Thoughts of Harm to Others: No Current Homicidal Intent: No Current Homicidal Plan: No Access to Homicidal Means: No Identified Victim: None identified History of harm to others?: No Assessment of  Violence: None Noted Does patient have access to weapons?: No Criminal Charges Pending?: No Does patient have a court date: No Is patient on  probation?: No  Psychosis Hallucinations: None noted Delusions: None noted  Mental Status Report Appearance/Hygiene: In scrubs Eye Contact: Good Motor Activity: Restlessness Speech: Slow Level of Consciousness: Alert Mood: Euthymic Affect: Appropriate to circumstance Anxiety Level: Minimal Thought Processes: Coherent Judgement: Partial Orientation: Place, Situation, Person Obsessive Compulsive Thoughts/Behaviors: None  Cognitive Functioning Concentration: Fair Memory: Recent Intact IQ: Below Average Insight: Fair Impulse Control: Poor Appetite: Good Sleep: No Change Vegetative Symptoms: None  ADLScreening [facility] Services) Patient's cognitive ability adequate to safely complete daily activities?: Yes Patient able to express need for assistance with ADLs?: Yes Independently performs ADLs?: Yes (appropriate for developmental age)  Prior Inpatient Therapy Prior Inpatient Therapy: Yes Prior Therapy Dates: 1999 Prior Therapy Facilty/Provider(s): [facility], [place] Reason for Treatment: Schizophrenia  Prior Outpatient Therapy Prior Outpatient Therapy: Yes Prior Therapy Dates: Current Prior Therapy Facilty/Provider(s): [facility] Reason for Treatment: Schizophrenia Does patient have an ACCT team?: Yes Does patient have Intensive In-House Services?  : No Does patient have [facility] services? : No Does patient have P4CC services?: No  ADL Screening (condition at time of admission) Patient's cognitive ability adequate to safely complete daily activities?: Yes Is the patient deaf or have difficulty hearing?: No Does the patient have difficulty seeing, even when wearing glasses/contacts?: No Does the patient have difficulty concentrating, remembering, or making decisions?: Yes Patient able to express need for assistance with ADLs?: Yes Does the patient have difficulty dressing or bathing?: No Independently performs ADLs?: Yes (appropriate for developmental age) Does the  patient have difficulty walking or climbing stairs?: No Weakness of Legs: None Weakness of Arms/Hands: None       Abuse/Neglect Assessment (Assessment to be complete while patient is alone) Physical Abuse: Denies Verbal Abuse: Denies Sexual Abuse: Denies Exploitation of patient/patient's resources: Denies Self-Neglect: Denies     Regulatory affairs officer (For Healthcare) Does Patient Have a Medical Advance Directive?: No    Additional Information 1:1 In Past 12 Months?: No CIRT Risk: No Elopement Risk: No Does patient have medical clearance?: Yes     Disposition:  Disposition Initial Assessment Completed for this Encounter: Yes Disposition of Patient: Other dispositions  On Site Evaluation by:   Reviewed with Physician:    Elmer Bales 11/15/2016 12:45 AM

## 2016-11-15 NOTE — ED Notes (Signed)
pts new caregiver/ care coordinator   [phone] [employer]

## 2016-11-15 NOTE — Progress Notes (Signed)
MEDICATION RELATED CONSULT NOTE - INITIAL   Pharmacy Consult for Clozapine monitoring   No Known Allergies  Labs:  Recent Labs  11/14/16 1742 11/15/16 0939  WBC 6.3 5.7  HGB 11.2* 10.6*  HCT 34.5* 32.8*  PLT 332 304  CREATININE 2.19*  --   ALBUMIN 3.8  --   PROT 6.7  --   AST 18  --   ALT 26  --   ALKPHOS 92  --   BILITOT 0.6  --     Medications:  Scheduled:  . clonazePAM  0.5 mg Oral TID  . cloZAPine  300 mg Oral Daily  . divalproex  500 mg Oral Q12H  . famotidine  20 mg Oral BID  . metoprolol succinate  25 mg Oral Daily  . perphenazine  4 mg Oral QHS    Assessment: 51 yo M on Clozapine 300 mg daily PTA.  (patient just admitted 11/06/16-11/11/16 at [facility])  6/19  ANC= 4.1  Plan:  [place] submitted to clozapine [facility]S program. Continue with weekly ANC while inpatient. Next [place] on 11/22/16.    Masae Lukacs A 11/15/2016,11:43 AM

## 2016-11-15 NOTE — ED Notes (Signed)
Pt given meal tray and juice. 

## 2016-11-15 NOTE — ED Notes (Signed)
Pt given belongings bag 1 of 1. Pants were not in the bag. Pt wore scrub pants when he left.

## 2016-11-15 NOTE — Consult Note (Signed)
[place] Psychiat[facility] Consult   Reason for Consult:  Consult for 51 year old man with a history of chronic psychotic disorder who was brought to the emergency room by his treatment team yesterday with concern about how well he was caring for himself Referring Physician:  Mariea Clonts Patient Identification: Nicholas Cole MRN:  [id number] Principal Diagnosis: Schizoaffective disorder, bipolar type [facility]) Diagnosis:   Patient Active Problem List   Diagnosis Date Noted  . Lithium toxicity [T56.891A] 11/06/2016  . ARF (acute renal failure) ([place]) [N17.9] 11/05/2016  . Dehydration [E86.0] 11/05/2016  . Rhabdomyolysis [M62.82] 11/05/2016  . Schizoaffective disorder, bipolar type ([place]) [F25.0] 11/05/2016    Total Time spent with patient: 1 hour  Subjective:   Nicholas Cole is a 51 y.o. male patient admitted with "I'm feeling okay now".  HPI:  Patient interviewed chart reviewed. Labs reviewed. Case discussed with emergency room physician and TTS and nursing. Patient well known from prior encounters. 51 year old man who has chronic psychotic disorder either schizophrenia or schizoaffective disorder who was brought to the emergency room yesterday by his outpatient treatment team. Reportedly they went by to visit him at his apartment and found him soaked in urine and possibly confused. On my interview with the patient today he is easily arousable alert oriented and communicative. Nicholas Cole schizophrenic disorder is evident with some confusion and disorganized thinking even at baseline but he was actually pretty well put together for him. He says his mood is feeling fine. He denies any suicidal thoughts or homicidal thoughts. Denies active hallucinations. Patient has some insight that he is not capable of managing his medicines very well if he has to keep up with the actual bottles and measuring out the pills. He has not been aggressive or agitated since being in the emergency room. He has been cooperative  with his medicine. Medically his lithium continues to come down and although his creatinine was slightly higher than it was on Friday that may be related to some dehydration. Does not seem to represent a worsening of the lithium toxicity. Patient is requesting to go back home and follow-up with outpatient treatment.  Medical history: Patient was just discharged from the hospital on Friday after an extended stay for lithium toxicity and renal failure that required temporary hemodialysis.  Social history: He is currently living independently and hasn't act team. He had been in a group home until March and so he has just been in this apartment for a few months. He had had another act team but "fired" them for what ever reason recently so his new act team is not as familiar with him. Patient does not have a lot of other social support outside of his act team. He has a long history of severe mental illness and has been in institutions for extended periods of his adult life.  Substance abuse history: Denies any alcohol or drug abuse. Had had a little bit of problem with this in the past but it doesn't look like it's an active problem recently.  Past Psychiatric History: Long history of chronic mental health problems. Diagnosis is listed as schizoaffective disorder but I think that the patient is more like a person with schizophrenia. He has chronic negative symptoms as well as chronic low-grade positive symptoms even with the best medication. When he is sick he has a tendency to get easily agitated and has been aggressive in the past. No history of suicide attempts previously. Multiple prior hospitalization. Had been stable on clozapine and lithium which has recently  been changed because of the lithium toxicity. He is off his lithium now and I had started him on Depakote and clonazepam when he was in the hospital.  Risk to Self: Suicidal Ideation: No Suicidal Intent: No Is patient at risk for suicide?:  No Suicidal Plan?: No Access to Means: No What has been your use of drugs/alcohol within the last 12 months?: none reported How many times?: 0 Other Self Harm Risks: denied Triggers for Past Attempts: None known Intentional Self Injurious Behavior: None Risk to Others: Homicidal Ideation: No Thoughts of Harm to Others: No Current Homicidal Intent: No Current Homicidal Plan: No Access to Homicidal Means: No Identified Victim: None identified History of harm to others?: No Assessment of Violence: None Noted Does patient have access to weapons?: No Criminal Charges Pending?: No Does patient have a court date: No Prior Inpatient Therapy: Prior Inpatient Therapy: Yes Prior Therapy Dates: 1999 Prior Therapy Facilty/Provider(s): [place], [place] Reason for Treatment: Schizophrenia Prior Outpatient Therapy: Prior Outpatient Therapy: Yes Prior Therapy Dates: Current Prior Therapy Facilty/Provider(s): [facility] Reason for Treatment: Schizophrenia Does patient have an ACCT team?: Yes Does patient have Intensive In-House Services?  : No Does patient have [facility] services? : No Does patient have P4CC services?: No  Past Medical History: History reviewed. No pertinent past medical history. History reviewed. No pertinent surgical history. Family History: No family history on file. Family Psychiatric  History: None known Social History:  History  Alcohol use Not on file     History  Drug use: Unknown    Social History   Social History  . Marital status: Single    Spouse name: N/A  . Number of children: N/A  . Years of education: N/A   Social History Main Topics  . Smoking status: Former Research scientist (life sciences)  . Smokeless tobacco: Never Used  . Alcohol use None  . Drug use: Unknown  . Sexual activity: Not Asked   Other Topics Concern  . None   Social History Narrative  . None   Additional Social History:    Allergies:  No Known Allergies  Labs:  Results for orders placed or performed  during the hospital encounter of 11/14/16 (from the past 48 hour(s))  Comprehensive metabolic panel     Status: Abnormal   Collection Time: 11/14/16  5:42 PM  Result Value Ref Range   Sodium 141 135 - 145 mmol/L   Potassium 4.8 3.5 - 5.1 mmol/L   Chloride 111 101 - 111 mmol/L   CO2 26 22 - 32 mmol/L   Glucose, Bld 97 65 - 99 mg/dL   BUN 12 6 - 20 mg/dL   Creatinine, Ser 2.19 (H) 0.61 - 1.24 mg/dL   Calcium 9.1 8.9 - 10.3 mg/dL   Total Protein 6.7 6.5 - 8.1 g/dL   Albumin 3.8 3.5 - 5.0 g/dL   AST 18 15 - 41 U/L   ALT 26 17 - 63 U/L   Alkaline Phosphatase 92 38 - 126 U/L   Total Bilirubin 0.6 0.3 - 1.2 mg/dL   GFR calc non Af Amer 34 (L) >60 mL/min   GFR calc Af Amer 39 (L) >60 mL/min    Comment: (NOTE) The eGFR has been calculated using the CKD EPI equation. This calculation has not been validated in all clinical situations. eGFR's persistently <60 mL/min signify possible Chronic Kidney Disease.    Anion gap 4 (L) 5 - 15  Ethanol     Status: None   Collection Time: 11/14/16  5:42 PM  Result Value Ref Range   Alcohol, Ethyl (B) <5 <5 mg/dL    Comment:        LOWEST DETECTABLE LIMIT FOR SERUM ALCOHOL IS 5 mg/dL FOR MEDICAL PURPOSES ONLY   Salicylate level     Status: None   Collection Time: 11/14/16  5:42 PM  Result Value Ref Range   Salicylate Lvl <7.2 2.8 - 30.0 mg/dL  Acetaminophen level     Status: Abnormal   Collection Time: 11/14/16  5:42 PM  Result Value Ref Range   Acetaminophen (Tylenol), Serum <10 (L) 10 - 30 ug/mL    Comment:        THERAPEUTIC CONCENTRATIONS VARY SIGNIFICANTLY. A RANGE OF 10-30 ug/mL MAY BE AN EFFECTIVE CONCENTRATION FOR MANY PATIENTS. HOWEVER, SOME ARE BEST TREATED AT CONCENTRATIONS OUTSIDE THIS RANGE. ACETAMINOPHEN CONCENTRATIONS >150 ug/mL AT 4 HOURS AFTER INGESTION AND >50 ug/mL AT 12 HOURS AFTER INGESTION ARE OFTEN ASSOCIATED WITH TOXIC REACTIONS.   cbc     Status: Abnormal   Collection Time: 11/14/16  5:42 PM  Result Value  Ref Range   WBC 6.3 3.8 - 10.6 K/uL   RBC 3.94 (L) 4.40 - 5.90 MIL/uL   Hemoglobin 11.2 (L) 13.0 - 18.0 g/dL   HCT 34.5 (L) 40.0 - 52.0 %   MCV 87.7 80.0 - 100.0 fL   MCH 28.6 26.0 - 34.0 pg   MCHC 32.6 32.0 - 36.0 g/dL   RDW 13.1 11.5 - 14.5 %   Platelets 332 150 - 440 K/uL  Urine Drug Screen, Qualitative     Status: None   Collection Time: 11/14/16  5:42 PM  Result Value Ref Range   Tricyclic, Ur Screen NONE DETECTED NONE DETECTED   Amphetamines, Ur Screen NONE DETECTED NONE DETECTED   MDMA (Ecstasy)Ur Screen NONE DETECTED NONE DETECTED   Cocaine Metabolite,Ur [place] NONE DETECTED NONE DETECTED   Opiate, Ur Screen NONE DETECTED NONE DETECTED   Phencyclidine (PCP) Ur S NONE DETECTED NONE DETECTED   Cannabinoid 50 Ng, Ur  NONE DETECTED NONE DETECTED   Barbiturates, Ur Screen NONE DETECTED NONE DETECTED   Benzodiazepine, Ur Scrn NONE DETECTED NONE DETECTED   Methadone Scn, Ur NONE DETECTED NONE DETECTED    Comment: (NOTE) 536  Tricyclics, urine               Cutoff 1000 ng/mL 200  Amphetamines, urine             Cutoff 1000 ng/mL 300  MDMA (Ecstasy), urine           Cutoff 500 ng/mL 400  Cocaine Metabolite, urine       Cutoff 300 ng/mL 500  Opiate, urine                   Cutoff 300 ng/mL 600  Phencyclidine (PCP), urine      Cutoff 25 ng/mL 700  Cannabinoid, urine              Cutoff 50 ng/mL 800  Barbiturates, urine             Cutoff 200 ng/mL 900  Benzodiazepine, urine           Cutoff 200 ng/mL 1000 Methadone, urine                Cutoff 300 ng/mL 1100 1200 The urine drug screen provides only a preliminary, unconfirmed 1300 analytical test result and should not be used for non-medical 1400 purposes. Clinical consideration and professional judgment should 1500  be applied to any positive drug screen result due to possible 1600 interfering substances. A more specific alternate chemical method 1700 must be used in order to obtain a confirmed analytical result.  1800 Gas  chromato graphy / mass spectrometry (GC/MS) is the preferred 1900 confirmatory method.   Urinalysis, Routine w reflex microscopic     Status: Abnormal   Collection Time: 11/14/16  5:42 PM  Result Value Ref Range   Color, Urine STRAW (A) YELLOW   APPearance CLEAR (A) CLEAR   Specific Gravity, Urine 1.006 1.005 - 1.030   pH 7.0 5.0 - 8.0   Glucose, UA NEGATIVE NEGATIVE mg/dL   Hgb urine dipstick NEGATIVE NEGATIVE   Bilirubin Urine NEGATIVE NEGATIVE   Ketones, ur NEGATIVE NEGATIVE mg/dL   Protein, ur NEGATIVE NEGATIVE mg/dL   Nitrite NEGATIVE NEGATIVE   Leukocytes, UA NEGATIVE NEGATIVE  CK     Status: None   Collection Time: 11/14/16  5:42 PM  Result Value Ref Range   Total CK 94 49 - 397 U/L  Lithium level     Status: Abnormal   Collection Time: 11/14/16  5:42 PM  Result Value Ref Range   Lithium Lvl 0.22 (L) 0.60 - 1.20 mmol/L  CBC with Differential/Platelet     Status: Abnormal   Collection Time: 11/15/16  9:39 AM  Result Value Ref Range   WBC 5.7 3.8 - 10.6 K/uL   RBC 3.76 (L) 4.40 - 5.90 MIL/uL   Hemoglobin 10.6 (L) 13.0 - 18.0 g/dL   HCT 32.8 (L) 40.0 - 52.0 %   MCV 87.3 80.0 - 100.0 fL   MCH 28.2 26.0 - 34.0 pg   MCHC 32.4 32.0 - 36.0 g/dL   RDW 13.2 11.5 - 14.5 %   Platelets 304 150 - 440 K/uL   Neutrophils Relative % 73 %   Neutro Abs 4.1 1.4 - 6.5 K/uL   Lymphocytes Relative 17 %   Lymphs Abs 1.0 1.0 - 3.6 K/uL   Monocytes Relative 8 %   Monocytes Absolute 0.5 0.2 - 1.0 K/uL   Eosinophils Relative 1 %   Eosinophils Absolute 0.1 0 - 0.7 K/uL   Basophils Relative 1 %   Basophils Absolute 0.0 0 - 0.1 K/uL    Current Facility-Administered Medications  Medication Dose Route Frequency Provider Last Rate Last Dose  . clonazePAM (KLONOPIN) tablet 0.5 mg  0.5 mg Oral TID Hinda Kehr, MD   0.5 mg at 11/15/16 0850  . cloZAPine (CLOZARIL) tablet 300 mg  300 mg Oral Daily Hinda Kehr, MD      . divalproex (DEPAKOTE) DR tablet 500 mg  500 mg Oral Q12H Hinda Kehr,  MD   500 mg at 11/15/16 0850  . famotidine (PEPCID) tablet 20 mg  20 mg Oral BID Hinda Kehr, MD   20 mg at 11/15/16 0850  . metoprolol succinate (TOPROL-XL) 24 hr tablet 25 mg  25 mg Oral Daily Hinda Kehr, MD   25 mg at 11/15/16 0850  . perphenazine (TRILAFON) tablet 4 mg  4 mg Oral QHS Hinda Kehr, MD   4 mg at 11/15/16 0050   Current Outpatient Prescriptions  Medication Sig Dispense Refill  . atenolol (TENORMIN) 25 MG tablet Take 25 mg by mouth daily.    . clonazePAM (KLONOPIN) 0.5 MG tablet Take 1 tablet (0.5 mg total) by mouth 3 (three) times daily. 90 tablet 0  . cloZAPine (CLOZARIL) 100 MG tablet Take 3 tablets (300 mg total) by mouth daily. 90 tablet 0  .  divalproex (DEPAKOTE) 500 MG DR tablet Take 1 tablet (500 mg total) by mouth every 12 (twelve) hours. 60 tablet 0  . lithium carbonate 300 MG capsule Take 600 mg by mouth 2 (two) times daily.    . metoprolol succinate (TOPROL-XL) 25 MG 24 hr tablet Take 1 tablet (25 mg total) by mouth daily. 30 tablet 0  . perphenazine (TRILAFON) 4 MG tablet Take 1 tablet (4 mg total) by mouth at bedtime. 30 tablet 0  . ranitidine (ZANTAC) 150 MG tablet Take 150 mg by mouth 2 (two) times daily.      Musculoskeletal: Strength & Muscle Tone: within normal limits Gait & Station: normal Patient leans: N/A  Psychiatric Specialty Exam: Physical Exam  Nursing note and vitals reviewed. Constitutional: He appears well-developed and well-nourished.  HENT:  Head: Normocephalic and atraumatic.  Eyes: Conjunctivae are normal. Pupils are equal, round, and reactive to light.  Neck: Normal range of motion.  Cardiovascular: Regular rhythm and normal heart sounds.   Respiratory: Effort normal and breath sounds normal. No respiratory distress.  GI: Soft.  Musculoskeletal: Normal range of motion.  Neurological: He is alert.  Skin: Skin is warm and dry.  Psychiatric: His mood appears anxious. His speech is delayed and tangential. He is slowed. Thought  content is not delusional. He expresses impulsivity. He expresses no homicidal and no suicidal ideation. He exhibits abnormal recent memory.    Review of Systems  Constitutional: Negative.   HENT: Negative.   Eyes: Negative.   Respiratory: Negative.   Cardiovascular: Negative.   Gastrointestinal: Negative.   Musculoskeletal: Negative.   Skin: Negative.   Neurological: Negative.   Psychiatric/Behavioral: Positive for memory loss. Negative for depression, hallucinations, substance abuse and suicidal ideas. The patient is nervous/anxious. The patient does not have insomnia.     Blood pressure 129/80, pulse 84, temperature 97.8 F (36.6 C), temperature source Oral, resp. rate 18, height '5\' 9"'$  (1.753 m), weight 74.8 kg (165 lb), SpO2 100 %.Body mass index is 24.37 kg/m.  General Appearance: Casual  Eye Contact:  Good  Speech:  Normal Rate  Volume:  Decreased  Mood:  Euthymic  Affect:  Constricted  Thought Process:  Disorganized  Orientation:  Full (Time, Place, and Person)  Thought Content:  Rumination and Tangential  Suicidal Thoughts:  No  Homicidal Thoughts:  No  Memory:  Immediate;   Good Recent;   Fair Remote;   Fair  Judgement:  Fair  Insight:  Fair  Psychomotor Activity:  Normal  Concentration:  Concentration: Fair  Recall:  [employer] of Knowledge:  Fair  Language:  Fair  Akathisia:  No  Handed:  Right  AIMS (if indicated):     Assets:  Desire for Improvement Housing Physical Health Resilience  ADL's:  Intact  Cognition:  Impaired,  Mild  Sleep:        Treatment Plan Summary: Medication management and Plan 51 year old man with schizophrenia. Mentally he appears to currently be at his baseline. There is no sign of acute dangerousness. I think he does not need inpatient hospitalization. I am concerned about how well living independently is going to go for him. Patient has insight that he really doesn't have the skills to take care of a complex medicine regimen  safely on his own. He is going to need close contact with his act team to keep him compliant with medicines and make sure he is eating and drinking and taking care of his health. He hasn't act team already set  up. Discontinued IVC as he does not meet commitment criteria. No new prescriptions required. He can be discharged from the emergency room and will follow-up with his act team in the community. Case reviewed with the ER physician and TTS.  Disposition: Patient does not meet criteria for psychiatric inpatient admission. Supportive therapy provided about ongoing stressors.  Alethia Berthold, MD 11/15/2016 12:00 PM

## 2016-11-15 NOTE — ED Notes (Signed)
BEHAVIORAL HEALTH ROUNDING Patient sleeping: No. Patient alert and oriented: yes Behavior appropriate: Yes.  ; If no, describe:  Nutrition and fluids offered: yes Toileting and hygiene offered: Yes  Sitter present: q15 minute observations and security  monitoring Law enforcement present: Yes  ODS  

## 2016-11-15 NOTE — ED Notes (Signed)
Pt up to bathroom with no problems.

## 2016-11-15 NOTE — ED Notes (Signed)

## 2016-11-15 NOTE — ED Provider Notes (Signed)
51 y.o. male with a history of schizophrenia and bipolar disorder in the hospital for psychiatric complaints. The patient was discharged several days ago after an admission for lithium overdose requiring dialysis. Today, the patient's creatinine is 2.19, which is higher than his discharge creatinine of 1.92 4 days ago. The patient's lithium level does continue to go down. The patient is tolerating liquids without any difficulty, and I have let him note is important to drink plenty of fluid. In addition, I have put this on his discharge paperwork, as well as the need to have his creatinine rechecked tomorrow by his primary care physician. He does not meet criteria for admission, but this needs to be closely monitored and has been emphasized.   Eula Listen, MD 11/15/16 1200

## 2016-11-15 NOTE — ED Notes (Signed)
ED Is the patient under IVC or is there intent for IVC: Yes.   Is the patient medically cleared: Yes.   Is there vacancy in the ED BHU: Yes.   Is the population mix appropriate for patient: Yes.   Is the patient awaiting placement in inpatient or outpatient setting: Yes.   Has the patient had a psychiatric consult:  Consult pending   Survey of unit performed for contraband, proper placement and condition of furniture, tampering with fixtures in bathroom, shower, and each patient room: Yes.  ; Findings:  APPEARANCE/BEHAVIOR Calm and cooperative NEURO ASSESSMENT Orientation: oriented x 4 Denies pain Hallucinations: No.None noted (Hallucinations) Speech: Normal Gait: normal RESPIRATORY ASSESSMENT Even  Unlabored respirations  CARDIOVASCULAR ASSESSMENT Pulses equal   regular rate  Skin warm and dry   GASTROINTESTINAL ASSESSMENT no GI complaint EXTREMITIES Full ROM  PLAN OF CARE Provide calm/safe environment. Vital signs assessed twice daily. ED BHU Assessment once each 12-hour shift. Collaborate with TTS daily or as condition indicates. Assure the ED provider has rounded once each shift. Provide and encourage hygiene. Provide redirection as needed. Assess for escalating behavior; address immediately and inform ED provider.  Assess family dynamic and appropriateness for visitation as needed: Yes.  ; If necessary, describe findings:  Educate the patient/family about BHU procedures/visitation: Yes.  ; If necessary, describe findings:

## 2016-11-15 NOTE — ED Notes (Signed)
I called and gave report to behavioral holding unit due to pt has an order to move there and await psych consult  - pt reports  "i would like to move so that I can walk around while I wait."

## 2016-11-15 NOTE — Discharge Instructions (Addendum)
Today your kidney function is abnormal. Please drink plenty of fluid, at least 8 large glasses per day, to improve her kidney function. Please see her primary care physician tomorrow to have her kidney function rechecked.  Please return to the emergency department if you develop thoughts of hurting herself or anyone else, hallucinations, or any other symptoms concerning to you.

## 2016-11-15 NOTE — ED Notes (Signed)
Pt given lunch tray.

## 2016-11-15 NOTE — ED Notes (Signed)
Pt observed with no unusual behavior  Appropriate to stimulation  No verbalized needs or concerns at this time  NAD assessed   I introduced myself to him and he verbalizes that he remembers me and that I should be proud of him cause he is living independently in an apartment  - I reinforced his elation with being independent   I observed that he has a mattress on the floor so I changed it out with a mattress approved for the floor   Continue to monitor

## 2016-11-15 NOTE — ED Notes (Signed)
MD awaits holding unit nurse to collaborate  - I received a call from Ulsa a trainee in the holding unit and she states that he cannot move because he has MR  - I relayed this info to the MD - No MR Dx on his history   - I encouraged them to speak with the MD but they ended the call   Charge nurse made aware  He remains alert and oriented and he continues to verbalize that he would like to walk around and maybe sit in a dayroom while he awaits his psych consult   NAD assessed  Continue to monitor

## 2016-11-15 NOTE — ED Notes (Signed)
Meal tray did not come for pt. Called down to cafeteria and informed them that tray was not sent and they said they would send one.

## 2016-11-16 ENCOUNTER — Emergency Department
Admission: EM | Admit: 2016-11-16 | Discharge: 2016-11-17 | Disposition: A | Discharge status: Home / Self Care | Payer: Medicare Other | Attending: Emergency Medicine | Admitting: Emergency Medicine

## 2016-11-16 ENCOUNTER — Encounter: Payer: Self-pay | Admitting: Emergency Medicine

## 2016-11-16 ENCOUNTER — Emergency Department: Payer: Medicare Other

## 2016-11-16 DIAGNOSIS — Z87891 Personal history of nicotine dependence: Secondary | ICD-10-CM | POA: Diagnosis not present

## 2016-11-16 DIAGNOSIS — F209 Schizophrenia, unspecified: Secondary | ICD-10-CM | POA: Diagnosis not present

## 2016-11-16 DIAGNOSIS — N189 Chronic kidney disease, unspecified: Secondary | ICD-10-CM | POA: Diagnosis not present

## 2016-11-16 DIAGNOSIS — R9431 Abnormal electrocardiogram [ECG] [EKG]: Secondary | ICD-10-CM | POA: Diagnosis not present

## 2016-11-16 DIAGNOSIS — R4182 Altered mental status, unspecified: Secondary | ICD-10-CM | POA: Diagnosis present

## 2016-11-16 DIAGNOSIS — R0602 Shortness of breath: Secondary | ICD-10-CM | POA: Diagnosis not present

## 2016-11-16 DIAGNOSIS — Z79899 Other long term (current) drug therapy: Secondary | ICD-10-CM | POA: Insufficient documentation

## 2016-11-16 DIAGNOSIS — F250 Schizoaffective disorder, bipolar type: Secondary | ICD-10-CM | POA: Diagnosis not present

## 2016-11-16 DIAGNOSIS — R4781 Slurred speech: Secondary | ICD-10-CM | POA: Diagnosis not present

## 2016-11-16 HISTORY — DX: Acute kidney failure, unspecified: N17.9

## 2016-11-16 HISTORY — DX: Schizophrenia, unspecified: F20.9

## 2016-11-16 LAB — URINALYSIS, COMPLETE (UACMP) WITH MICROSCOPIC
Bilirubin Urine: NEGATIVE
Glucose, UA: NEGATIVE mg/dL
Hgb urine dipstick: NEGATIVE
Ketones, ur: NEGATIVE mg/dL
Leukocytes, UA: NEGATIVE
Nitrite: NEGATIVE
Protein, ur: NEGATIVE mg/dL
SPECIFIC GRAVITY, URINE: 1.01 (ref 1.005–1.030)
pH: 6 (ref 5.0–8.0)

## 2016-11-16 LAB — URINE DRUG SCREEN, QUALITATIVE (ARMC ONLY)
Amphetamines, Ur Screen: NOT DETECTED
BENZODIAZEPINE, UR SCRN: NOT DETECTED
Barbiturates, Ur Screen: NOT DETECTED
Cannabinoid 50 Ng, Ur ~~LOC~~: NOT DETECTED
Cocaine Metabolite,Ur ~~LOC~~: NOT DETECTED
MDMA (Ecstasy)Ur Screen: NOT DETECTED
Methadone Scn, Ur: NOT DETECTED
Opiate, Ur Screen: NOT DETECTED
PHENCYCLIDINE (PCP) UR S: NOT DETECTED
Tricyclic, Ur Screen: NOT DETECTED

## 2016-11-16 LAB — LITHIUM LEVEL: Lithium Lvl: 0.12 mmol/L — ABNORMAL LOW (ref 0.60–1.20)

## 2016-11-16 LAB — CBC WITH DIFFERENTIAL/PLATELET
Basophils Absolute: 0 10*3/uL (ref 0–0.1)
Basophils Relative: 1 %
EOS ABS: 0.1 10*3/uL (ref 0–0.7)
EOS PCT: 1 %
HCT: 34.4 % — ABNORMAL LOW (ref 40.0–52.0)
Hemoglobin: 11.3 g/dL — ABNORMAL LOW (ref 13.0–18.0)
Lymphocytes Relative: 16 %
Lymphs Abs: 0.8 10*3/uL — ABNORMAL LOW (ref 1.0–3.6)
MCH: 28.7 pg (ref 26.0–34.0)
MCHC: 32.7 g/dL (ref 32.0–36.0)
MCV: 87.7 fL (ref 80.0–100.0)
MONO ABS: 0.4 10*3/uL (ref 0.2–1.0)
MONOS PCT: 7 %
Neutro Abs: 3.7 10*3/uL (ref 1.4–6.5)
Neutrophils Relative %: 75 %
PLATELETS: 316 10*3/uL (ref 150–440)
RBC: 3.92 MIL/uL — AB (ref 4.40–5.90)
RDW: 13.4 % (ref 11.5–14.5)
WBC: 5 10*3/uL (ref 3.8–10.6)

## 2016-11-16 LAB — BASIC METABOLIC PANEL
Anion gap: 6 (ref 5–15)
BUN: 10 mg/dL (ref 6–20)
CHLORIDE: 109 mmol/L (ref 101–111)
CO2: 24 mmol/L (ref 22–32)
CREATININE: 2.27 mg/dL — AB (ref 0.61–1.24)
Calcium: 8.9 mg/dL (ref 8.9–10.3)
GFR calc Af Amer: 37 mL/min — ABNORMAL LOW (ref >60)
GFR, EST NON AFRICAN AMERICAN: 32 mL/min — AB (ref >60)
Glucose, Bld: 96 mg/dL (ref 65–99)
Potassium: 4.7 mmol/L (ref 3.5–5.1)
SODIUM: 139 mmol/L (ref 135–145)

## 2016-11-16 LAB — CK: CK TOTAL: 108 U/L (ref 49–397)

## 2016-11-16 LAB — ACETAMINOPHEN LEVEL: Acetaminophen (Tylenol), Serum: <10 ug/mL — ABNORMAL LOW (ref 10–30)

## 2016-11-16 LAB — SALICYLATE LEVEL: Salicylate Lvl: <7 mg/dL (ref 2.8–30.0)

## 2016-11-16 LAB — HEPATIC FUNCTION PANEL
ALK PHOS: 93 U/L (ref 38–126)
ALT: 19 U/L (ref 17–63)
AST: 17 U/L (ref 15–41)
Albumin: 3.8 g/dL (ref 3.5–5.0)
Bilirubin, Direct: <0.1 mg/dL — ABNORMAL LOW (ref 0.1–0.5)
TOTAL PROTEIN: 6.7 g/dL (ref 6.5–8.1)
Total Bilirubin: 0.7 mg/dL (ref 0.3–1.2)

## 2016-11-16 LAB — ETHANOL

## 2016-11-16 MED ORDER — DIVALPROEX SODIUM 500 MG PO DR TAB
500.0000 mg | DELAYED_RELEASE_TABLET | Freq: Two times a day (BID) | ORAL | Status: DC
  Administered 2016-11-16 – 2016-11-17 (×2): 500 mg via ORAL
  Filled 2016-11-16 (×2): qty 1

## 2016-11-16 MED ORDER — FENTANYL CITRATE (PF) 100 MCG/2ML IJ SOLN: 50.0000 ug | Freq: Once | INTRAMUSCULAR | Status: DC

## 2016-11-16 MED ORDER — PERPHENAZINE 4 MG PO TABS
4.0000 mg | ORAL_TABLET | Freq: Every day | ORAL | Status: DC
  Administered 2016-11-16: 4 mg via ORAL
  Filled 2016-11-16 (×2): qty 1

## 2016-11-16 MED ORDER — METOPROLOL SUCCINATE ER 50 MG PO TB24
25.0000 mg | ORAL_TABLET | Freq: Every day | ORAL | Status: DC
  Administered 2016-11-16 – 2016-11-17 (×2): 25 mg via ORAL
  Filled 2016-11-16 (×2): qty 1

## 2016-11-16 MED ORDER — LORAZEPAM 2 MG/ML IJ SOLN
INTRAMUSCULAR | Status: AC
  Filled 2016-11-16: qty 1

## 2016-11-16 MED ORDER — HALOPERIDOL LACTATE 5 MG/ML IJ SOLN
INTRAMUSCULAR | Status: AC
  Administered 2016-11-16: 5 mg via INTRAVENOUS
  Filled 2016-11-16: qty 1

## 2016-11-16 MED ORDER — HALOPERIDOL LACTATE 5 MG/ML IJ SOLN
5.0000 mg | Freq: Once | INTRAMUSCULAR | Status: AC
  Administered 2016-11-16: 5 mg via INTRAVENOUS

## 2016-11-16 MED ORDER — LITHIUM CARBONATE 300 MG PO CAPS
600.0000 mg | ORAL_CAPSULE | Freq: Two times a day (BID) | ORAL | Status: DC
  Administered 2016-11-16 – 2016-11-17 (×2): 600 mg via ORAL
  Filled 2016-11-16 (×3): qty 2

## 2016-11-16 MED ORDER — LORAZEPAM 2 MG/ML IJ SOLN
2.0000 mg | Freq: Once | INTRAMUSCULAR | Status: AC
  Administered 2016-11-16: 2 mg via INTRAVENOUS

## 2016-11-16 NOTE — ED Notes (Signed)
PT IVC/ PENDING FOLLOW UP PSYCH CONSULT

## 2016-11-16 NOTE — ED Notes (Signed)
This RN, [place], RN, Anda Kraft, RN, Kae Heller, Extern to bedside, pt was noted to be getting extremely agitated, yelling at Karen Kitchens telling her to go to hell and that someone grabbed his arm, this RN explained to patient that no one had grabbed his arm, pt became more agitated after repeatedly telling this RN to go back to [place], this RN explained to patient that she was not from [place] and patient stated "I don't care you look like it!". Pt was given medication without incident. Pt is now noted to be more calm. Resting in bed. Will continue to monitor for further patient needs.

## 2016-11-16 NOTE — BH Assessment (Signed)
Assessment Note  Nicholas Cole is an 51 y.o. male presenting to the ED under IVC after his ACT Team found him in an altered mental state.  Pt's crisis team is concerned about his increasing psychosis.  Pt was verbally aggressive in the ED and had to be sedated.  Pt eventually calmed down and denied SI/HI.  Pt denied auditory/visual hallucinations.  Pt denied any drug/alcohol use.  Diagnosis: Schizophrenia  Past Medical History:  Past Medical History:  Diagnosis Date  . Acute renal failure (ARF) ([place])   . Schizophrenia ([place])     History reviewed. No pertinent surgical history.  Family History: History reviewed. No pertinent family history.  Social History:  reports that he has quit smoking. He has never used smokeless tobacco. He reports that he does not drink alcohol or use drugs.  Additional Social History:  Alcohol / Drug Use Pain Medications: See PTA Prescriptions: See PTA Over the Counter: See PTA History of alcohol / drug use?: No history of alcohol / drug abuse  CIWA: CIWA-Ar BP: (!) 159/98 Pulse Rate: 73 COWS:    Allergies: No Known Allergies  Home Medications:  (Not in a hospital admission)  OB/GYN Status:  No LMP for male patient.  General Assessment Data Location of Assessment: [facility] ED TTS Assessment: In system Is this a Tele or Face-to-Face Assessment?: Face-to-Face Is this an Initial Assessment or a Re-assessment for this encounter?: Initial Assessment Marital status: Single Maiden name: n/a Is patient pregnant?: No Pregnancy Status: No Living Arrangements: Alone Can pt return to current living arrangement?: Yes Admission Status: Involuntary Is patient capable of signing voluntary admission?: Yes Referral Source: Self/Family/Friend Insurance type: [facility] Medicare     Crisis Care Plan Living Arrangements: Alone Legal Guardian: Other: (self) Name of Psychiatrist: [facility] Name of Therapist: [facility]  Education Status Is patient currently in school?:  No Current Grade: n/a Highest grade of school patient has completed: 8th Name of school: [employer] person: n/a  Risk to self with the past 6 months Suicidal Ideation: No Has patient been a risk to self within the past 6 months prior to admission? : No Suicidal Intent: No Has patient had any suicidal intent within the past 6 months prior to admission? : No Is patient at risk for suicide?: No Suicidal Plan?: No Has patient had any suicidal plan within the past 6 months prior to admission? : No Access to Means: No What has been your use of drugs/alcohol within the last 12 months?: Pt denies drug/alcohol use Previous Attempts/Gestures: No How many times?: 0 Other Self Harm Risks: None identified Triggers for Past Attempts: None known Intentional Self Injurious Behavior: None Family Suicide History: No Recent stressful life event(s): Other (Comment) Persecutory voices/beliefs?: No Depression: No Substance abuse history and/or treatment for substance abuse?: No Suicide prevention information given to non-admitted patients: Not applicable  Risk to Others within the past 6 months Homicidal Ideation: No Does patient have any lifetime risk of violence toward others beyond the six months prior to admission? : No Thoughts of Harm to Others: No Current Homicidal Intent: No Current Homicidal Plan: No Access to Homicidal Means: No Identified Victim: None identified History of harm to others?: No Assessment of Violence: None Noted Does patient have access to weapons?: No Criminal Charges Pending?: No Does patient have a court date: No Is patient on probation?: No  Psychosis Hallucinations: None noted Delusions: None noted  Mental Status Report Appearance/Hygiene: In scrubs Eye Contact: Good Motor Activity: Freedom of movement Speech: Slow  Level of Consciousness: Alert Mood: Euthymic Affect: Appropriate to circumstance Anxiety Level: Minimal Thought Processes:  Relevant Judgement: Partial Orientation: Place, Situation, Person Obsessive Compulsive Thoughts/Behaviors: None  Cognitive Functioning Concentration: Good Memory: Recent Intact IQ: Below Average Insight: Fair Impulse Control: Fair Appetite: Good Sleep: No Change Vegetative Symptoms: None  ADLScreening [facility] Assessment Services) Patient's cognitive ability adequate to safely complete daily activities?: Yes Patient able to express need for assistance with ADLs?: Yes Independently performs ADLs?: Yes (appropriate for developmental age)  Prior Inpatient Therapy Prior Inpatient Therapy: Yes Prior Therapy Dates: 1999 Prior Therapy Facilty/Provider(s): [facility], [facility] Reason for Treatment: Schizophrenia  Prior Outpatient Therapy Prior Outpatient Therapy: Yes Prior Therapy Dates: Current Prior Therapy Facilty/Provider(s): [facility] Reason for Treatment: Schizophrenia Does patient have an ACCT team?: Yes Does patient have Intensive In-House Services?  : No Does patient have [facility] services? : No Does patient have P4CC services?: No  ADL Screening (condition at time of admission) Patient's cognitive ability adequate to safely complete daily activities?: Yes Patient able to express need for assistance with ADLs?: Yes Independently performs ADLs?: Yes (appropriate for developmental age)       Abuse/Neglect Assessment (Assessment to be complete while patient is alone) Physical Abuse: Denies Verbal Abuse: Denies Sexual Abuse: Denies Exploitation of patient/patient's resources: Denies Self-Neglect: Denies Values / Beliefs Cultural Requests During Hospitalization: None Spiritual Requests During Hospitalization: None Consults Spiritual Care Consult Needed: No Social Work Consult Needed: No Regulatory affairs officer (For Healthcare) Does Patient Have a Catering manager?: No Type of Advance Directive: Press photographer, Living will Copy of [place] in  Chart?: No - copy requested Copy of Living Will in Chart?: No - copy requested Would patient like information on creating a medical advance directive?: No - Patient declined    Additional Information 1:1 In Past 12 Months?: No CIRT Risk: No Elopement Risk: No Does patient have medical clearance?: Yes     Disposition:  Disposition Initial Assessment Completed for this Encounter: Yes Disposition of Patient: Other dispositions Other disposition(s): Other (Comment) (Pending Psych MD consult)  On Site Evaluation by:   Reviewed with Physician:    Oneita Hurt 11/16/2016 11:03 PM

## 2016-11-16 NOTE — ED Provider Notes (Signed)
[facility] Emergency Department Provider Note  ____________________________________________   First MD Initiated Contact with Patient 11/16/16 1522     (approximate)  I have reviewed the triage vital signs and the nursing notes.   HISTORY  Chief Complaint Altered Mental Status  Level V exemption history Limited by the patient's psychiatric illness  HPI Nicholas Cole is a 51 y.o. male who comes to the emergency department via EMS for a legitimate altered mental status. The patient has a long psychiatric history and was discharged from our emergency department yesterday. Today he had an ACT team check up on him and noted that he was behaving bizarrely so sent him to the emergency department. Earlier this month he was found to have acute kidney injury secondary to dehydration and was found to be lithium toxic for which he received dialysis. Further history is limited as the patient is disorganized.His blood sugar was 86 in the field.   Past Medical History:  Diagnosis Date  . Acute renal failure (ARF) ([place])   . Schizophrenia [facility])     Patient Active Problem List   Diagnosis Date Noted  . Lithium toxicity 11/06/2016  . ARF (acute renal failure) ([place]) 11/05/2016  . Dehydration 11/05/2016  . Rhabdomyolysis 11/05/2016  . Schizoaffective disorder, bipolar type ([place]) 11/05/2016    History reviewed. No pertinent surgical history.  Prior to Admission medications   Medication Sig Start Date End Date Taking? Authorizing Provider  clonazePAM (KLONOPIN) 0.5 MG tablet Take 1 tablet (0.5 mg total) by mouth 3 (three) times daily. 11/11/16  Yes Wieting, Richard, MD  cloZAPine (CLOZARIL) 100 MG tablet Take 3 tablets (300 mg total) by mouth daily. 11/11/16  Yes Wieting, Richard, MD  divalproex (DEPAKOTE) 500 MG DR tablet Take 1 tablet (500 mg total) by mouth every 12 (twelve) hours. 11/11/16  Yes Wieting, Richard, MD  lithium carbonate 300 MG capsule Take 300-600 mg by  mouth 2 (two) times daily. Take 300 mg in the morning, 600 mg at night. 11/04/16  Yes [provider]  metoprolol succinate (TOPROL-XL) 25 MG 24 hr tablet Take 1 tablet (25 mg total) by mouth daily. 11/11/16  Yes Wieting, Richard, MD  perphenazine (TRILAFON) 4 MG tablet Take 1 tablet (4 mg total) by mouth at bedtime. 11/11/16  Yes Wieting, Richard, MD  ranitidine (ZANTAC) 150 MG tablet Take 150 mg by mouth 2 (two) times daily.   Yes [provider]    Allergies Patient has no known allergies.  History reviewed. No pertinent family history.  Social History Social History  Substance Use Topics  . Smoking status: Former Research scientist (life sciences)  . Smokeless tobacco: Never Used  . Alcohol use No    Review of Systems Level V exemption history Limited by the patient's psychiatric illness  ____________________________________________   PHYSICAL EXAM:  VITAL SIGNS: ED Triage Vitals [11/16/16 1521]  Enc Vitals Group     BP      Pulse      Resp      Temp      Temp src      SpO2 100 %     Weight      Height      Head Circumference      Peak Flow      Pain Score      Pain Loc      Pain Edu?      Excl. in [place]?     Constitutional: Disheveled confused intermittently combative and clearly psychotic Eyes: PERRL  EOMI. pupils are 2 mm to 1 mm brisk Head: Atraumatic. Nose: No congestion/rhinnorhea. Mouth/Throat: No trismus Neck: No stridor.   Cardiovascular: Normal rate, regular rhythm. Grossly normal heart sounds.  Good peripheral circulation. Respiratory: Normal respiratory effort.  No retractions. Lungs CTAB and moving good air Gastrointestinal: Soft nontender Musculoskeletal: No lower extremity edema   Neurologic:  Normal speech and language. No gross focal neurologic deficits are appreciated. Skin:  Skin is warm, dry and intact. No rash noted. Psychiatric: Psychotic with bizarre affect    ____________________________________________     _______________________________________   LABS (all labs ordered are listed, but only abnormal results are displayed)  Labs Reviewed  BASIC METABOLIC PANEL - Abnormal; Notable for the following:       Result Value   Creatinine, Ser 2.27 (*)    GFR calc non Af Amer 32 (*)    GFR calc Af Amer 37 (*)    All other components within normal limits  HEPATIC FUNCTION PANEL - Abnormal; Notable for the following:    Bilirubin, Direct <0.1 (*)    All other components within normal limits  ACETAMINOPHEN LEVEL - Abnormal; Notable for the following:    Acetaminophen (Tylenol), Serum <10 (*)    All other components within normal limits  CBC WITH DIFFERENTIAL/PLATELET - Abnormal; Notable for the following:    RBC 3.92 (*)    Hemoglobin 11.3 (*)    HCT 34.4 (*)    Lymphs Abs 0.8 (*)    All other components within normal limits  LITHIUM LEVEL - Abnormal; Notable for the following:    Lithium Lvl 0.12 (*)    All other components within normal limits  URINALYSIS, COMPLETE (UACMP) WITH MICROSCOPIC - Abnormal; Notable for the following:    Color, Urine STRAW (*)    Squamous Epithelial / LPF 0-5 (*)    Bacteria, UA RARE (*)    All other components within normal limits  ETHANOL  SALICYLATE LEVEL  CK  URINE DRUG SCREEN, QUALITATIVE ([facility] ONLY)    Kidney injury is chronic and no change from baseline no signs of significant ingestion __________________________________________  EKG  ED ECG REPORT I, Darel Hong, the attending physician, personally viewed and interpreted this ECG.  Date: 11/16/2016 Rate: 86 Rhythm: normal sinus rhythm QRS Axis: normal Intervals: normal ST/T Wave abnormalities: Mild anterior ST elevation consistent with early repolarization Narrative Interpretation: unremarkable  ____________________________________________  RADIOLOGY  Chest x-ray with no acute disease ____________________________________________   PROCEDURES  Procedure(s) performed:  no  Procedures  Critical Care performed: no  Observation: no ____________________________________________   INITIAL IMPRESSION / ASSESSMENT AND PLAN / ED COURSE  Pertinent labs & imaging results that were available during my care of the patient were reviewed by me and considered in my medical decision making (see chart for details).  The patient arrives clearly psychotic with an unknown baseline. Broad labs are pending.      ___----------------------------------------- 4:11 PM on 11/16/2016 -----------------------------------------  The patient is now acutely psychotic and is a danger to himself. He is sitting up in bed screaming "you're the devil your the devil go to hell back to [place]" he is saying this to white nurses. I'm giving him intravenous haloperidol and lorazepam for his own safety as well as placing him on involuntary commitment. __________________________________  ----------------------------------------- 5:33 PM on 11/16/2016 -----------------------------------------  Fortunately the patient's labs are unremarkable and at this point he is medically stable for psychiatric evaluation. _______   FINAL CLINICAL IMPRESSION(S) / ED DIAGNOSES  Final  diagnoses:  Chronic kidney disease, unspecified CKD stage  Schizophrenia, unspecified type ([place])      NEW MEDICATIONS STARTED DURING THIS VISIT:  New Prescriptions   No medications on file     Note:  This document was prepared using Dragon voice recognition software and may include unintentional dictation errors.     Darel Hong, MD 11/16/16 [phone]

## 2016-11-16 NOTE — BH Assessment (Signed)
Pt sleeping. Per pt RN Jinny Blossom) pt received Haldol and Ativan. TTS to assess at later time.

## 2016-11-16 NOTE — ED Notes (Signed)
Pt continuously calls out "go to hell", or "bitch" or calling out for a phone. This RN placed bed alarm on patient due to patient beginning to get agitated and sitting up in bed.

## 2016-11-16 NOTE — Consult Note (Signed)
Psychiatry: Came by to see patient after being advised of his presence in the emergency room and the involuntary commitment. Spoke with emergency room physician. Patient was asleep by the time I got to see him having already been administered some sedating medication. I am familiar with the patient and also have been involved in his treatment over his last 2 visits to the hospital, most recently yesterday. It sounds like he was brought in to the emergency room after somebody, possibly his new act team, came by to check on him and found him with altered mental status. Patient reportedly became acutely agitated and was cursing at staff in the emergency room in a psychotic manner. I note that his creatinine today is slightly higher than it was yesterday. Lithium was not rechecked although in less he has for some reason been taking some more of it at home it should not be any higher than it was before.  I will follow-up with the patient. I am not surprised that he has returned again to the hospital but I am disappointed because I think ultimately he may need psychiatric hospitalization and that this may indicate that independent living is not going to work out for him. We will follow-up tomorrow.

## 2016-11-16 NOTE — ED Notes (Signed)
X-ray at bedside

## 2016-11-16 NOTE — ED Notes (Signed)
Pt resting in bed at this time. NAD Noted. Security at bedside at this time due to patient being IVC. Will continue to monitor for further patient needs.

## 2016-11-16 NOTE — ED Triage Notes (Signed)
Pt presents to ED via [facility] for altered mental status. Pt presents with slurred speech, per EMS pt has pinpoint pupils, somnolent, has slurred speech and is confused to the month, but otherwise oriented and arousable to verbal stimuli. EMS reports that patient has a hx of schizophrenia, was seen earlier in the month for lithium toxicity and was seen 2 days ago. Was found by care giver earlier today altered and sitting in a chair.

## 2016-11-17 DIAGNOSIS — F250 Schizoaffective disorder, bipolar type: Secondary | ICD-10-CM | POA: Diagnosis not present

## 2016-11-17 DIAGNOSIS — N189 Chronic kidney disease, unspecified: Secondary | ICD-10-CM | POA: Diagnosis not present

## 2016-11-17 MED ORDER — LORAZEPAM 2 MG PO TABS
2.0000 mg | ORAL_TABLET | Freq: Once | ORAL | Status: AC
  Administered 2016-11-17: 2 mg via ORAL
  Filled 2016-11-17: qty 1

## 2016-11-17 MED ORDER — HALOPERIDOL 5 MG PO TABS
5.0000 mg | ORAL_TABLET | Freq: Once | ORAL | Status: AC
  Administered 2016-11-17: 5 mg via ORAL
  Filled 2016-11-17: qty 1

## 2016-11-17 MED ORDER — LORAZEPAM 2 MG PO TABS
ORAL_TABLET | ORAL | Status: AC
  Administered 2016-11-17: 2 mg via ORAL
  Filled 2016-11-17: qty 1

## 2016-11-17 NOTE — Discharge Instructions (Signed)
Return to the emergency department immediately for any worsening condition including agitation, injury or thoughts of injury to yourself or others, or any other symptoms concerning to you.

## 2016-11-17 NOTE — ED Notes (Signed)
BEHAVIORAL HEALTH ROUNDING Patient sleeping: No. Patient alert and oriented: yes Behavior appropriate: Yes.  ; If no, describe:  Nutrition and fluids offered: yes Toileting and hygiene offered: Yes  Sitter present: q15 minute observations and security  monitoring Law enforcement present: Yes  ODS  

## 2016-11-17 NOTE — ED Notes (Signed)
Patient in bathroom

## 2016-11-17 NOTE — ED Notes (Signed)
Patient in room watching tv

## 2016-11-17 NOTE — ED Notes (Signed)
ED Is the patient under IVC or is there intent for IVC: Yes.   Is the patient medically cleared: Yes.   Is there vacancy in the ED BHU: Yes.   Is the population mix appropriate for patient: Yes.   Is the patient awaiting placement in inpatient or outpatient setting: Yes.   Has the patient had a psychiatric consult: Yes. - pending  reevaluation today  Survey of unit performed for contraband, proper placement and condition of furniture, tampering with fixtures in bathroom, shower, and each patient room: Yes.  ; Findings:  APPEARANCE/BEHAVIOR Calm and cooperative NEURO ASSESSMENT Orientation: oriented x 3  Denies pain Hallucinations: he denies  (Hallucinations) Speech: Normal Gait: normal RESPIRATORY ASSESSMENT Even  Unlabored respirations  CARDIOVASCULAR ASSESSMENT Pulses equal   regular rate  Skin warm and dry   GASTROINTESTINAL ASSESSMENT no GI complaint EXTREMITIES Full ROM  PLAN OF CARE Provide calm/safe environment. Vital signs assessed twice daily.  Assessment once each 12-hour shift. Collaborate with TTS daily or as condition indicates. Assure the ED provider has rounded once each shift. Provide and encourage hygiene. Provide redirection as needed. Assess for escalating behavior; address immediately and inform ED provider.  Assess family dynamic and appropriateness for visitation as needed: Yes.  ; If necessary, describe findings:  Educate the patient/family about BHU procedures/visitation: Yes.  ; If necessary, describe findings:

## 2016-11-17 NOTE — BH Assessment (Signed)
This clinician attempted to reach out to the patients ACT team listed in previous notes at ACT team: [phone] and [phone].  Unable to reach anyone at these numbers. Contacted [facility] in [place] was informed the patient is on [facility] in [place]. Given the number for [facility] in [place] directed this clinician to the care coordinator at [phone], [place] with [facility] states they don't have an ACT Team. Referred me to [facility] [phone]  Spoke to [place], at [facility] in [place] who directed me to Allison. Colletta Maryland states the patient is new to them, the patient transferred to their services in the last few weeks. She understands the patient moved out on his own about three months ago. "The patient presents well, was eating , drinking, taking med's." The last week he was not doing well, Staff checked on him daily, the patient took more med's than he was supposed to and found him confused. Typically patients under their care are seen 2 to 3 times a week unless they are decompensated and then are seen daily. The patient has been receiving daily visits the last few weeks.

## 2016-11-17 NOTE — ED Notes (Signed)

## 2016-11-17 NOTE — ED Notes (Signed)
Patient in shower 

## 2016-11-17 NOTE — ED Provider Notes (Signed)
Vitals:   11/16/16 2148 11/17/16 0614  BP: (!) 159/98 (!) 143/106  Pulse: 73 79  Resp: 14 14  Temp: 97.9 F (36.6 C) 97.9 F (36.6 C)   No complaints presented to me over night shift.  Awaiting psych dispo.   Lisa Roca, MD 11/17/16 [phone]

## 2016-11-17 NOTE — ED Notes (Signed)
Patient on phone  

## 2016-11-17 NOTE — ED Notes (Signed)
Breakfast was given to patient. 

## 2016-11-17 NOTE — Consult Note (Signed)
[facility] Face-to-Face Psychiatry Consult   Reason for Consult:  Consult follow-up for 51 year old man with schizophrenia Referring Physician:  Reita Cliche Patient Identification: Nicholas Cole MRN:  [id number] Principal Diagnosis: Schizoaffective disorder, bipolar type [facility]) Diagnosis:   Patient Active Problem List   Diagnosis Date Noted  . Lithium toxicity [T56.891A] 11/06/2016  . ARF (acute renal failure) ([place]) [N17.9] 11/05/2016  . Dehydration [E86.0] 11/05/2016  . Rhabdomyolysis [M62.82] 11/05/2016  . Schizoaffective disorder, bipolar type ([place]) [F25.0] 11/05/2016    Total Time spent with patient: 45 minutes  Subjective:   Nicholas Cole is a 51 y.o. male patient admitted with "I was sick yesterday".  HPI:  Patient interviewed today. I saw him yesterday as well. Patient today has no specific acute symptoms. He denies any hallucinations and he denies suicidal or homicidal thoughts. He has vague memories of having been hostile yesterday to staff. He has vague justifications for it but understands that it was inappropriate. Today the patient is requesting to go back home. He is calm and denies suicidal or homicidal thought. This is several times in a row now we have seen this gentleman back in the hospital after his admission for lithium toxicity. Patient says that he is determined to take his medicines correctly at this point.  Past Psychiatric History: Long history of schizophrenia multiple prior hospitalizations. Recent need to discontinue lithium because of toxicity  Risk to Self: Suicidal Ideation: No Suicidal Intent: No Is patient at risk for suicide?: No Suicidal Plan?: No Access to Means: No What has been your use of drugs/alcohol within the last 12 months?: Pt denies drug/alcohol use How many times?: 0 Other Self Harm Risks: None identified Triggers for Past Attempts: None known Intentional Self Injurious Behavior: None Risk to Others: Homicidal Ideation: No Thoughts of Harm to  Others: No Current Homicidal Intent: No Current Homicidal Plan: No Access to Homicidal Means: No Identified Victim: None identified History of harm to others?: No Assessment of Violence: None Noted Does patient have access to weapons?: No Criminal Charges Pending?: No Does patient have a court date: No Prior Inpatient Therapy: Prior Inpatient Therapy: Yes Prior Therapy Dates: 1999 Prior Therapy Facilty/Provider(s): [facility], [place] Reason for Treatment: Schizophrenia Prior Outpatient Therapy: Prior Outpatient Therapy: Yes Prior Therapy Dates: Current Prior Therapy Facilty/Provider(s): [facility] Reason for Treatment: Schizophrenia Does patient have an ACCT team?: Yes Does patient have Intensive In-House Services?  : No Does patient have [facility] services? : No Does patient have [facility] services?: No  Past Medical History:  Past Medical History:  Diagnosis Date  . Acute renal failure (ARF) ([place])   . Schizophrenia ([place])    History reviewed. No pertinent surgical history. Family History: History reviewed. No pertinent family history. Family Psychiatric  History: Unknown but probably positive for psychotic disorder Social History:  History  Alcohol Use No     History  Drug Use No    Social History   Social History  . Marital status: Single    Spouse name: N/A  . Number of children: N/A  . Years of education: N/A   Social History Main Topics  . Smoking status: Former Research scientist (life sciences)  . Smokeless tobacco: Never Used  . Alcohol use No  . Drug use: No  . Sexual activity: Not Asked   Other Topics Concern  . None   Social History Narrative  . None   Additional Social History:    Allergies:  No Known Allergies  Labs:  Results for orders placed or performed during the hospital encounter  of 11/16/16 (from the past 48 hour(s))  Urinalysis, Complete w Microscopic     Status: Abnormal   Collection Time: 11/16/16  3:28 PM  Result Value Ref Range   Color, Urine STRAW (A) YELLOW    APPearance CLEAR CLEAR   Specific Gravity, Urine 1.010 1.005 - 1.030   pH 6.0 5.0 - 8.0   Glucose, UA NEGATIVE NEGATIVE mg/dL   Hgb urine dipstick NEGATIVE NEGATIVE   Bilirubin Urine NEGATIVE NEGATIVE   Ketones, ur NEGATIVE NEGATIVE mg/dL   Protein, ur NEGATIVE NEGATIVE mg/dL   Nitrite NEGATIVE NEGATIVE   Leukocytes, UA NEGATIVE NEGATIVE   Squamous Epithelial / LPF 0-5 (A) NONE SEEN   WBC, UA 0-5 0 - 5 WBC/hpf   RBC / HPF 0-5 0 - 5 RBC/hpf   Bacteria, UA RARE (A) NONE SEEN   Mucous PRESENT   Urine Drug Screen, Qualitative     Status: None   Collection Time: 11/16/16  3:28 PM  Result Value Ref Range   Tricyclic, Ur Screen NONE DETECTED NONE DETECTED   Amphetamines, Ur Screen NONE DETECTED NONE DETECTED   MDMA (Ecstasy)Ur Screen NONE DETECTED NONE DETECTED   Cocaine Metabolite,Ur [place] NONE DETECTED NONE DETECTED   Opiate, Ur Screen NONE DETECTED NONE DETECTED   Phencyclidine (PCP) Ur S NONE DETECTED NONE DETECTED   Cannabinoid 50 Ng, Ur [place] NONE DETECTED NONE DETECTED   Barbiturates, Ur Screen NONE DETECTED NONE DETECTED   Benzodiazepine, Ur Scrn NONE DETECTED NONE DETECTED   Methadone Scn, Ur NONE DETECTED NONE DETECTED    Comment: (NOTE) 509  Tricyclics, urine               Cutoff 1000 ng/mL 200  Amphetamines, urine             Cutoff 1000 ng/mL 300  MDMA (Ecstasy), urine           Cutoff 500 ng/mL 400  Cocaine Metabolite, urine       Cutoff 300 ng/mL 500  Opiate, urine                   Cutoff 300 ng/mL 600  Phencyclidine (PCP), urine      Cutoff 25 ng/mL 700  Cannabinoid, urine              Cutoff 50 ng/mL 800  Barbiturates, urine             Cutoff 200 ng/mL 900  Benzodiazepine, urine           Cutoff 200 ng/mL 1000 Methadone, urine                Cutoff 300 ng/mL 1100 1200 The urine drug screen provides only a preliminary, unconfirmed 1300 analytical test result and should not be used for non-medical 1400 purposes. Clinical consideration and professional judgment  should 1500 be applied to any positive drug screen result due to possible 1600 interfering substances. A more specific alternate chemical method 1700 must be used in order to obtain a confirmed analytical result.  1800 Gas chromato graphy / mass spectrometry (GC/MS) is the preferred 1900 confirmatory method.   Basic metabolic panel     Status: Abnormal   Collection Time: 11/16/16  3:32 PM  Result Value Ref Range   Sodium 139 135 - 145 mmol/L   Potassium 4.7 3.5 - 5.1 mmol/L   Chloride 109 101 - 111 mmol/L   CO2 24 22 - 32 mmol/L   Glucose, Bld 96 65 - 99 mg/dL  BUN 10 6 - 20 mg/dL   Creatinine, Ser 5.42 (H) 0.61 - 1.24 mg/dL   Calcium 8.9 8.9 - 98.5 mg/dL   GFR calc non Af Amer 32 (L) >60 mL/min   GFR calc Af Amer 37 (L) >60 mL/min    Comment: (NOTE) The eGFR has been calculated using the CKD EPI equation. This calculation has not been validated in all clinical situations. eGFR's persistently <60 mL/min signify possible Chronic Kidney Disease.    Anion gap 6 5 - 15  Hepatic function panel     Status: Abnormal   Collection Time: 11/16/16  3:32 PM  Result Value Ref Range   Total Protein 6.7 6.5 - 8.1 g/dL   Albumin 3.8 3.5 - 5.0 g/dL   AST 17 15 - 41 U/L   ALT 19 17 - 63 U/L   Alkaline Phosphatase 93 38 - 126 U/L   Total Bilirubin 0.7 0.3 - 1.2 mg/dL   Bilirubin, Direct <9.0 (L) 0.1 - 0.5 mg/dL   Indirect Bilirubin NOT CALCULATED 0.3 - 0.9 mg/dL  Ethanol     Status: None   Collection Time: 11/16/16  3:32 PM  Result Value Ref Range   Alcohol, Ethyl (B) <5 <5 mg/dL    Comment:        LOWEST DETECTABLE LIMIT FOR SERUM ALCOHOL IS 5 mg/dL FOR MEDICAL PURPOSES ONLY   Salicylate level     Status: None   Collection Time: 11/16/16  3:32 PM  Result Value Ref Range   Salicylate Lvl <7.0 2.8 - 30.0 mg/dL  Acetaminophen level     Status: Abnormal   Collection Time: 11/16/16  3:32 PM  Result Value Ref Range   Acetaminophen (Tylenol), Serum <10 (L) 10 - 30 ug/mL    Comment:         THERAPEUTIC CONCENTRATIONS VARY SIGNIFICANTLY. A RANGE OF 10-30 ug/mL MAY BE AN EFFECTIVE CONCENTRATION FOR MANY PATIENTS. HOWEVER, SOME ARE BEST TREATED AT CONCENTRATIONS OUTSIDE THIS RANGE. ACETAMINOPHEN CONCENTRATIONS >150 ug/mL AT 4 HOURS AFTER INGESTION AND >50 ug/mL AT 12 HOURS AFTER INGESTION ARE OFTEN ASSOCIATED WITH TOXIC REACTIONS.   CBC with Differential     Status: Abnormal   Collection Time: 11/16/16  3:32 PM  Result Value Ref Range   WBC 5.0 3.8 - 10.6 K/uL   RBC 3.92 (L) 4.40 - 5.90 MIL/uL   Hemoglobin 11.3 (L) 13.0 - 18.0 g/dL   HCT 34.1 (L) 88.8 - 54.1 %   MCV 87.7 80.0 - 100.0 fL   MCH 28.7 26.0 - 34.0 pg   MCHC 32.7 32.0 - 36.0 g/dL   RDW 82.5 82.7 - 14.7 %   Platelets 316 150 - 440 K/uL   Neutrophils Relative % 75 %   Neutro Abs 3.7 1.4 - 6.5 K/uL   Lymphocytes Relative 16 %   Lymphs Abs 0.8 (L) 1.0 - 3.6 K/uL   Monocytes Relative 7 %   Monocytes Absolute 0.4 0.2 - 1.0 K/uL   Eosinophils Relative 1 %   Eosinophils Absolute 0.1 0 - 0.7 K/uL   Basophils Relative 1 %   Basophils Absolute 0.0 0 - 0.1 K/uL  CK     Status: None   Collection Time: 11/16/16  3:32 PM  Result Value Ref Range   Total CK 108 49 - 397 U/L  Lithium level     Status: Abnormal   Collection Time: 11/16/16  3:33 PM  Result Value Ref Range   Lithium Lvl 0.12 (L) 0.60 - 1.20 mmol/L  Current Facility-Administered Medications  Medication Dose Route Frequency Provider Last Rate Last Dose  . divalproex (DEPAKOTE) DR tablet 500 mg  500 mg Oral Q12H Darel Hong, MD   500 mg at 11/17/16 1020  . lithium carbonate capsule 600 mg  600 mg Oral BID Darel Hong, MD   600 mg at 11/17/16 1020  . metoprolol succinate (TOPROL-XL) 24 hr tablet 25 mg  25 mg Oral Daily Darel Hong, MD   25 mg at 11/17/16 1020  . perphenazine (TRILAFON) tablet 4 mg  4 mg Oral QHS Darel Hong, MD   4 mg at 11/16/16 2224   Current Outpatient Prescriptions  Medication Sig Dispense Refill  .  clonazePAM (KLONOPIN) 0.5 MG tablet Take 1 tablet (0.5 mg total) by mouth 3 (three) times daily. 90 tablet 0  . cloZAPine (CLOZARIL) 100 MG tablet Take 3 tablets (300 mg total) by mouth daily. 90 tablet 0  . divalproex (DEPAKOTE) 500 MG DR tablet Take 1 tablet (500 mg total) by mouth every 12 (twelve) hours. 60 tablet 0  . lithium carbonate 300 MG capsule Take 300-600 mg by mouth 2 (two) times daily. Take 300 mg in the morning, 600 mg at night.    . metoprolol succinate (TOPROL-XL) 25 MG 24 hr tablet Take 1 tablet (25 mg total) by mouth daily. 30 tablet 0  . perphenazine (TRILAFON) 4 MG tablet Take 1 tablet (4 mg total) by mouth at bedtime. 30 tablet 0  . ranitidine (ZANTAC) 150 MG tablet Take 150 mg by mouth 2 (two) times daily.      Musculoskeletal: Strength & Muscle Tone: within normal limits Gait & Station: normal Patient leans: N/A  Psychiatric Specialty Exam: Physical Exam  Nursing note and vitals reviewed. Constitutional: He appears well-developed and well-nourished.  HENT:  Head: Normocephalic and atraumatic.  Eyes: Conjunctivae are normal. Pupils are equal, round, and reactive to light.  Neck: Normal range of motion.  Cardiovascular: Regular rhythm and normal heart sounds.   Respiratory: Effort normal. No respiratory distress.  GI: Soft.  Musculoskeletal: Normal range of motion.  Neurological: He is alert.  Skin: Skin is warm and dry.  Psychiatric: His affect is blunt. His speech is tangential. He is slowed. He expresses impulsivity. He expresses no homicidal and no suicidal ideation. He exhibits abnormal recent memory.    Review of Systems  Constitutional: Negative.   HENT: Negative.   Eyes: Negative.   Respiratory: Negative.   Cardiovascular: Negative.   Gastrointestinal: Negative.   Musculoskeletal: Negative.   Skin: Negative.   Neurological: Negative.   Psychiatric/Behavioral: Positive for memory loss. Negative for depression, hallucinations, substance abuse and  suicidal ideas. The patient is nervous/anxious. The patient does not have insomnia.     Blood pressure (!) 143/106, pulse 79, temperature 97.9 F (36.6 C), temperature source Oral, resp. rate 14, height '5\' 9"'$  (1.753 m), weight 165 lb (74.8 kg), SpO2 100 %.Body mass index is 24.37 kg/m.  General Appearance: Casual  Eye Contact:  Good  Speech:  Slow  Volume:  Decreased  Mood:  Euthymic  Affect:  Non-Congruent  Thought Process:  Goal Directed  Orientation:  Full (Time, Place, and Person)  Thought Content:  Tangential  Suicidal Thoughts:  No  Homicidal Thoughts:  No  Memory:  Immediate;   Fair Recent;   Fair Remote;   Fair  Judgement:  Fair  Insight:  Fair  Psychomotor Activity:  Decreased  Concentration:  Concentration: Fair  Recall:  [employer] of Knowledge:  Fair  Language:  Fair  Akathisia:  No  Handed:  Right  AIMS (if indicated):     Assets:  Desire for Improvement Housing Resilience  ADL's:  Intact  Cognition:  Impaired,  Mild  Sleep:        Treatment Plan Summary: Plan 51 year old man with schizophrenia or schizoaffective disorder. Because of his recent returns to the emergency room I am very concerned about whether independent living is going to work out for him. Patient understands the concern and expresses to me that continuing to live independently is very important to him. He says he now understands how to take his medicines and is determined to do it. I reviewed his medicines with him. He says he agrees to be cooperative with the act team. We have contacted his new act team and they are going to see him daily. I told the patient that if he continues to come back to the emergency room we may need to admit him to psychiatry for his own safety. He says he understands this. At this point he is no longer committable. He can be taken off of IVC and released back to his outpatient care were he will be seen by his act team daily. No new prescriptions needed. Case reviewed  with emergency room physician and TTS.  Disposition: Patient does not meet criteria for psychiatric inpatient admission. Supportive therapy provided about ongoing stressors.  Alethia Berthold, MD 11/17/2016 1:03 PM

## 2016-11-17 NOTE — BH Assessment (Addendum)
This clinician spoke with Cloe at [facility] and informed patient would be discharged. She reports Colletta [place] with the ACT will be in this area in the next few hours and could pick up the patient. Cloe given the [employer], [phone], for call back.   Dr. Reita Cliche notified of Discharge.   Colletta [place] with [facility] called and pick the patient up after 3pm. She plans to put the patient on a daily medication drop off protocol to avoid overuse of medications and will monitor ability to live independently.

## 2016-11-17 NOTE — ED Notes (Signed)
Pt observed lying in bed    Pt visualized with NAD  No verbalized needs or concerns at this time  Continue to monitor

## 2016-11-17 NOTE — ED Notes (Signed)
His ride from the ACT team will be here after 1500 to pick him up

## 2016-12-07 DIAGNOSIS — E784 Other hyperlipidemia: Secondary | ICD-10-CM | POA: Diagnosis not present

## 2016-12-07 DIAGNOSIS — E785 Hyperlipidemia, unspecified: Secondary | ICD-10-CM | POA: Diagnosis not present

## 2016-12-07 DIAGNOSIS — E559 Vitamin D deficiency, unspecified: Secondary | ICD-10-CM | POA: Diagnosis not present

## 2016-12-07 DIAGNOSIS — E7800 Pure hypercholesterolemia, unspecified: Secondary | ICD-10-CM | POA: Diagnosis not present

## 2016-12-07 DIAGNOSIS — D649 Anemia, unspecified: Secondary | ICD-10-CM | POA: Diagnosis not present

## 2016-12-19 ENCOUNTER — Emergency Department
Admission: EM | Admit: 2016-12-19 | Discharge: 2016-12-20 | Disposition: A | Discharge status: Home / Self Care | Payer: Medicare Other | Attending: Emergency Medicine | Admitting: Emergency Medicine

## 2016-12-19 DIAGNOSIS — R1032 Left lower quadrant pain: Secondary | ICD-10-CM | POA: Insufficient documentation

## 2016-12-19 DIAGNOSIS — Z87891 Personal history of nicotine dependence: Secondary | ICD-10-CM | POA: Diagnosis not present

## 2016-12-19 DIAGNOSIS — R109 Unspecified abdominal pain: Secondary | ICD-10-CM | POA: Diagnosis not present

## 2016-12-19 DIAGNOSIS — F209 Schizophrenia, unspecified: Secondary | ICD-10-CM | POA: Insufficient documentation

## 2016-12-19 DIAGNOSIS — R197 Diarrhea, unspecified: Secondary | ICD-10-CM | POA: Diagnosis not present

## 2016-12-19 DIAGNOSIS — Z79899 Other long term (current) drug therapy: Secondary | ICD-10-CM | POA: Insufficient documentation

## 2016-12-19 LAB — URINALYSIS, COMPLETE (UACMP) WITH MICROSCOPIC
Bacteria, UA: NONE SEEN
Bilirubin Urine: NEGATIVE
GLUCOSE, UA: NEGATIVE mg/dL
HGB URINE DIPSTICK: NEGATIVE
Ketones, ur: NEGATIVE mg/dL
Leukocytes, UA: NEGATIVE
NITRITE: NEGATIVE
PH: 5 (ref 5.0–8.0)
Protein, ur: NEGATIVE mg/dL
SPECIFIC GRAVITY, URINE: 1.004 — AB (ref 1.005–1.030)
Squamous Epithelial / LPF: NONE SEEN

## 2016-12-19 LAB — CBC
HEMATOCRIT: 32.8 % — AB (ref 40.0–52.0)
Hemoglobin: 11.4 g/dL — ABNORMAL LOW (ref 13.0–18.0)
MCH: 31.3 pg (ref 26.0–34.0)
MCHC: 34.9 g/dL (ref 32.0–36.0)
MCV: 89.8 fL (ref 80.0–100.0)
Platelets: 182 10*3/uL (ref 150–440)
RBC: 3.65 MIL/uL — ABNORMAL LOW (ref 4.40–5.90)
RDW: 14.1 % (ref 11.5–14.5)
WBC: 6.1 10*3/uL (ref 3.8–10.6)

## 2016-12-19 NOTE — ED Triage Notes (Signed)
Pt arrives to ED via [facility] from home with c/o LEFT flank pain and diarrhea x1 day. Pt reports h/x of kidney stones and 3-4 episodes of diarrhea today. Pt reports a "little bit" when asked if pt had blood in his urine. Pt does have a h/x of schizophrenia, pt is calm and cooperative at this time.

## 2016-12-19 NOTE — ED Provider Notes (Signed)
[facility] Emergency Department Provider Note   ____________________________________________   First MD Initiated Contact with Patient 12/19/16 2350     (approximate)  I have reviewed the triage vital signs and the nursing notes.   HISTORY  Chief Complaint Diarrhea and Flank Pain  Limited by paranoia and agitation  HPI Auther Lyerly is a 51 y.o. male who presents to the ED from home via EMS with a chief complaint of diarrhea and "kidney pain".Patient reports having diarrhea since 1999. Presents to the ED tonight secondary to left flank pain; reports he has a history of kidney stones. Denies associated fever, chills, chest pain, shortness of breath, abdominal pain, nausea, vomiting. Denies hematuria or dysuria. Denies recent travel or trauma. Denies active SI/HI.   Past Medical History:  Diagnosis Date  . Acute renal failure (ARF) ([place])   . Schizophrenia [facility])     Patient Active Problem List   Diagnosis Date Noted  . Lithium toxicity 11/06/2016  . ARF (acute renal failure) ([place]) 11/05/2016  . Dehydration 11/05/2016  . Rhabdomyolysis 11/05/2016  . Schizoaffective disorder, bipolar type ([place]) 11/05/2016    History reviewed. No pertinent surgical history.  Prior to Admission medications   Medication Sig Start Date End Date Taking? Authorizing Provider  clonazePAM (KLONOPIN) 0.5 MG tablet Take 1 tablet (0.5 mg total) by mouth 3 (three) times daily. 11/11/16   Loletha Grayer, MD  cloZAPine (CLOZARIL) 100 MG tablet Take 3 tablets (300 mg total) by mouth daily. 11/11/16   Loletha Grayer, MD  divalproex (DEPAKOTE) 500 MG DR tablet Take 1 tablet (500 mg total) by mouth every 12 (twelve) hours. 11/11/16   Loletha Grayer, MD  lithium carbonate 300 MG capsule Take 300-600 mg by mouth 2 (two) times daily. Take 300 mg in the morning, 600 mg at night. 11/04/16   [provider]  metoprolol succinate (TOPROL-XL) 25 MG 24 hr tablet Take 1 tablet (25 mg  total) by mouth daily. 11/11/16   Loletha Grayer, MD  perphenazine (TRILAFON) 4 MG tablet Take 1 tablet (4 mg total) by mouth at bedtime. 11/11/16   Loletha Grayer, MD  ranitidine (ZANTAC) 150 MG tablet Take 150 mg by mouth 2 (two) times daily.    [provider]    Allergies Patient has no known allergies.  No family history on file.  Social History Social History  Substance Use Topics  . Smoking status: Former Research scientist (life sciences)  . Smokeless tobacco: Never Used  . Alcohol use No    Review of Systems  Constitutional: No fever/chills. Eyes: No visual changes. ENT: No sore throat. Cardiovascular: Denies chest pain. Respiratory: Denies shortness of breath. Gastrointestinal: Positive for left flank pain. No abdominal pain.  No nausea, no vomiting.  Positive for diarrhea.  No constipation. Genitourinary: Negative for dysuria. Musculoskeletal: Negative for back pain. Skin: Negative for rash. Neurological: Negative for headaches, focal weakness or numbness. Psychiatric:Positive for paranoia.  ____________________________________________   PHYSICAL EXAM:  VITAL SIGNS: ED Triage Vitals  Enc Vitals Group     BP 12/19/16 2347 (!) 155/104     Pulse Rate 12/19/16 2347 71     Resp 12/19/16 2347 16     Temp --      Temp src --      SpO2 12/19/16 2347 100 %     Weight 12/19/16 2030 190 lb (86.2 kg)     Height 12/19/16 2030 5\' 9"  (1.753 m)     Head Circumference --      Peak  Flow --      Pain Score 12/19/16 2030 7     Pain Loc --      Pain Edu? --      Excl. in [place]? --     Constitutional: Alert and oriented. Well appearing and in no acute distress. Eyes: Conjunctivae are normal. PERRL. EOMI. Head: Atraumatic. Nose: No congestion/rhinnorhea. Mouth/Throat: Mucous membranes are moist.  Oropharynx non-erythematous. Neck: No stridor.   Cardiovascular: Normal rate, regular rhythm. Grossly normal heart sounds.  Good peripheral circulation. Respiratory: Normal respiratory  effort.  No retractions. Lungs CTAB. Gastrointestinal: Soft and nontender to light or deep palpation. No distention. No abdominal bruits. No CVA tenderness. Musculoskeletal: No lower extremity tenderness nor edema.  No joint effusions. Neurologic:  Normal speech and language. No gross focal neurologic deficits are appreciated. No gait instability. Skin:  Skin is warm, dry and intact. No rash noted. Psychiatric: Mood and affect are paranoid, aggressive. Speech and behavior are aggressive.  ____________________________________________   LABS (all labs ordered are listed, but only abnormal results are displayed)  Labs Reviewed  URINALYSIS, COMPLETE (UACMP) WITH MICROSCOPIC - Abnormal; Notable for the following:       Result Value   Color, Urine STRAW (*)    APPearance CLEAR (*)    Specific Gravity, Urine 1.004 (*)    All other components within normal limits  BASIC METABOLIC PANEL - Abnormal; Notable for the following:    Creatinine, Ser 2.46 (*)    GFR calc non Af Amer 29 (*)    GFR calc Af Amer 34 (*)    All other components within normal limits  CBC - Abnormal; Notable for the following:    RBC 3.65 (*)    Hemoglobin 11.4 (*)    HCT 32.8 (*)    All other components within normal limits  GASTROINTESTINAL PANEL BY PCR, STOOL (REPLACES STOOL CULTURE)  C DIFFICILE QUICK SCREEN W PCR REFLEX   ____________________________________________  EKG  None ____________________________________________  RADIOLOGY  Ct Renal Stone Study  Result Date: 12/20/2016 CLINICAL DATA:  Left flank pain and diarrhea EXAM: CT ABDOMEN AND PELVIS WITHOUT CONTRAST TECHNIQUE: Multidetector CT imaging of the abdomen and pelvis was performed following the standard protocol without IV contrast. COMPARISON:  Abdominal MRI 06/13/2008 FINDINGS: Lower chest: No pulmonary nodules or pleural effusion. No visible pericardial effusion. Hepatobiliary: Normal hepatic contours and density. No visible biliary  dilatation. Nondistended gallbladder. Pancreas: Normal contours without ductal dilatation. No peripancreatic fluid collection. Spleen: Normal. Adrenals/Urinary Tract: --Adrenal glands: Normal. --Right kidney/ureter: No hydronephrosis or perinephric stranding. No nephrolithiasis. No obstructing ureteral stones. --Left kidney/ureter: No hydronephrosis or perinephric stranding. No nephrolithiasis. No obstructing ureteral stones. --Urinary bladder: Unremarkable. Stomach/Bowel: --Stomach/Duodenum: Small hiatal hernia. --Small bowel: No dilatation or inflammation. --Colon: No focal abnormality. --Appendix: Normal. Vascular/Lymphatic: Normal course and caliber of the major abdominal vessels. No abdominal or pelvic lymphadenopathy. Reproductive: Normal prostate and seminal vesicles. Musculoskeletal. No bony spinal canal stenosis or focal osseous abnormality. Other: None. IMPRESSION: 1. No obstructive uropathy or nephrolithiasis. 2. No acute abnormality of the abdomen or pelvis. Electronically Signed   By: Ulyses Jarred M.D.   On: 12/20/2016 00:42    ____________________________________________   PROCEDURES  Procedure(s) performed: None  Procedures  Critical Care performed: No  ____________________________________________   INITIAL IMPRESSION / ASSESSMENT AND PLAN / ED COURSE  Pertinent labs & imaging results that were available during my care of the patient were reviewed by me and considered in my medical decision making (see chart for details).  51 year old male with history of schizophrenia and renal insufficiency who presents with diarrhea since 1999 and left flank pain. Laboratory and urinalysis results remarkable for elevated creatinine which is slightly above baseline. Will collect stool specimen for analysis and obtain CT renal colic protocol. Patient is paranoid and seems aggressive. Will ask TTS to evaluate. Seems to be responding to internal stimulus. Currently he is able to be verbally  redirected and denies SI/HI. Contracts for safety in the ED; will remain voluntarily.  Clinical Course as of Dec 20 645  Tue Dec 20, 2016  4010 CT and stool studies negative. Patient resting no acute distress. Patient was evaluated by TTS who recommends consult to Dr. Cora Collum from psychiatry. Patient is currently medically cleared and remains in the ED on a voluntary basis pending psychiatry evaluation today. Patient afebrile.  [JS]  2725 Patient sleeping in no acute distress. He will remain in the ED under voluntary status pending psychiatry evaluation this morning.  [JS]    Clinical Course User Index [JS] Paulette Blanch, MD     ____________________________________________   FINAL CLINICAL IMPRESSION(S) / ED DIAGNOSES  Final diagnoses:  Left flank pain  Diarrhea, unspecified type  Schizophrenia, unspecified type ([place])      NEW MEDICATIONS STARTED DURING THIS VISIT:  New Prescriptions   No medications on file     Note:  This document was prepared using Dragon voice recognition software and may include unintentional dictation errors.    Paulette Blanch, MD 12/20/16 [phone]

## 2016-12-19 NOTE — ED Notes (Signed)
Pt to triage via EMS via wc from home with no distress noted for diarrhea and "kidney pain"

## 2016-12-20 ENCOUNTER — Emergency Department: Payer: Medicare Other

## 2016-12-20 DIAGNOSIS — R109 Unspecified abdominal pain: Secondary | ICD-10-CM | POA: Diagnosis not present

## 2016-12-20 DIAGNOSIS — R1032 Left lower quadrant pain: Secondary | ICD-10-CM | POA: Diagnosis not present

## 2016-12-20 LAB — GASTROINTESTINAL PANEL BY PCR, STOOL (REPLACES STOOL CULTURE)

## 2016-12-20 LAB — C DIFFICILE QUICK SCREEN W PCR REFLEX
C DIFFICILE (CDIFF) INTERP: NOT DETECTED
C DIFFICILE (CDIFF) TOXIN: NEGATIVE
C Diff antigen: NEGATIVE

## 2016-12-20 LAB — BASIC METABOLIC PANEL
Anion gap: 6 (ref 5–15)
BUN: 13 mg/dL (ref 6–20)
CHLORIDE: 109 mmol/L (ref 101–111)
CO2: 26 mmol/L (ref 22–32)
Calcium: 9 mg/dL (ref 8.9–10.3)
Creatinine, Ser: 2.46 mg/dL — ABNORMAL HIGH (ref 0.61–1.24)
GFR calc Af Amer: 34 mL/min — ABNORMAL LOW (ref >60)
GFR calc non Af Amer: 29 mL/min — ABNORMAL LOW (ref >60)
GLUCOSE: 94 mg/dL (ref 65–99)
POTASSIUM: 4.1 mmol/L (ref 3.5–5.1)
SODIUM: 141 mmol/L (ref 135–145)

## 2016-12-20 MED ORDER — HYDROXYZINE HCL 25 MG PO TABS
50.0000 mg | ORAL_TABLET | Freq: Once | ORAL | Status: DC
  Filled 2016-12-20: qty 2

## 2016-12-20 NOTE — Progress Notes (Signed)
While rounding, [place] was asked for a visit by the  Pt  In ED-25. Pt stated he was admitted because of kidney stones and diarrhea. Pt discussed his history with drugs and claims to be clean since the late 1990's. CH provided the ministry of empathetic listening and prayer. Pt was discharged soon after the visit.    12/20/16 1000  Clinical Encounter Type  Visited With Patient  Visit Type Initial;Spiritual support;ED  Referral From Patient  Consult/Referral To Chaplain  Spiritual Encounters  Spiritual Needs Prayer;Emotional

## 2016-12-20 NOTE — ED Notes (Signed)
[facility] computer is in room and set up at this time for consult.

## 2016-12-20 NOTE — ED Provider Notes (Signed)
The patient has been evaluated at bedside by [facility] psychiatry.  Patient is clinically stable.  Not felt to be a danger to self or others.  No SI or Hi.  No indication for inpatient psychiatric admission at this time.  Appropriate for continued outpatient therapy.    Merlyn Lot, MD 12/20/16 [phone]

## 2016-12-20 NOTE — ED Notes (Signed)
TTS attempted to assess Nicholas Cole.  He was agitated and verbally aggressive with the assessor. Assessment was unable to be completed at this time.

## 2016-12-20 NOTE — Discharge Instructions (Signed)

## 2016-12-20 NOTE — ED Notes (Signed)
Patient given breakfast tray at this time.

## 2016-12-20 NOTE — ED Notes (Signed)
Patient screaming out "Nurse" because he wants two additional chocolate milks to help him defecate.

## 2016-12-30 DIAGNOSIS — Z79899 Other long term (current) drug therapy: Secondary | ICD-10-CM | POA: Diagnosis not present

## 2017-01-25 DIAGNOSIS — N183 Chronic kidney disease, stage 3 (moderate): Secondary | ICD-10-CM | POA: Diagnosis not present

## 2017-01-27 ENCOUNTER — Emergency Department: Payer: Medicare Other

## 2017-01-27 ENCOUNTER — Encounter: Payer: Self-pay | Admitting: Emergency Medicine

## 2017-01-27 ENCOUNTER — Emergency Department
Admission: EM | Admit: 2017-01-27 | Discharge: 2017-01-27 | Disposition: A | Discharge status: Other Acute Inpatient Hospital | Payer: Medicare Other | Attending: Emergency Medicine | Admitting: Emergency Medicine

## 2017-01-27 ENCOUNTER — Inpatient Hospital Stay
Admission: AD | Admit: 2017-01-27 | Discharge: 2017-02-07 | DRG: 885 | Disposition: A | Discharge status: Home / Self Care | Payer: Medicare Other | Attending: Psychiatry | Admitting: Psychiatry

## 2017-01-27 DIAGNOSIS — Z87891 Personal history of nicotine dependence: Secondary | ICD-10-CM | POA: Diagnosis not present

## 2017-01-27 DIAGNOSIS — N179 Acute kidney failure, unspecified: Secondary | ICD-10-CM | POA: Diagnosis not present

## 2017-01-27 DIAGNOSIS — F17290 Nicotine dependence, other tobacco product, uncomplicated: Secondary | ICD-10-CM | POA: Diagnosis present

## 2017-01-27 DIAGNOSIS — I129 Hypertensive chronic kidney disease with stage 1 through stage 4 chronic kidney disease, or unspecified chronic kidney disease: Secondary | ICD-10-CM | POA: Diagnosis present

## 2017-01-27 DIAGNOSIS — F250 Schizoaffective disorder, bipolar type: Secondary | ICD-10-CM | POA: Diagnosis present

## 2017-01-27 DIAGNOSIS — Z79899 Other long term (current) drug therapy: Secondary | ICD-10-CM | POA: Insufficient documentation

## 2017-01-27 DIAGNOSIS — K219 Gastro-esophageal reflux disease without esophagitis: Secondary | ICD-10-CM | POA: Diagnosis present

## 2017-01-27 DIAGNOSIS — I10 Essential (primary) hypertension: Secondary | ICD-10-CM

## 2017-01-27 DIAGNOSIS — R319 Hematuria, unspecified: Secondary | ICD-10-CM | POA: Diagnosis present

## 2017-01-27 DIAGNOSIS — N189 Chronic kidney disease, unspecified: Secondary | ICD-10-CM | POA: Diagnosis present

## 2017-01-27 DIAGNOSIS — F259 Schizoaffective disorder, unspecified: Secondary | ICD-10-CM | POA: Insufficient documentation

## 2017-01-27 DIAGNOSIS — R9431 Abnormal electrocardiogram [ECG] [EKG]: Secondary | ICD-10-CM | POA: Diagnosis not present

## 2017-01-27 DIAGNOSIS — R451 Restlessness and agitation: Secondary | ICD-10-CM | POA: Diagnosis not present

## 2017-01-27 DIAGNOSIS — G4700 Insomnia, unspecified: Secondary | ICD-10-CM | POA: Diagnosis present

## 2017-01-27 DIAGNOSIS — R3129 Other microscopic hematuria: Secondary | ICD-10-CM | POA: Diagnosis not present

## 2017-01-27 LAB — URINALYSIS, COMPLETE (UACMP) WITH MICROSCOPIC
Bacteria, UA: NONE SEEN
Bilirubin Urine: NEGATIVE
Glucose, UA: NEGATIVE mg/dL
Hgb urine dipstick: NEGATIVE
Ketones, ur: NEGATIVE mg/dL
Leukocytes, UA: NEGATIVE
Nitrite: NEGATIVE
Protein, ur: NEGATIVE mg/dL
Specific Gravity, Urine: 1.015 (ref 1.005–1.030)
Squamous Epithelial / LPF: NONE SEEN
pH: 5 (ref 5.0–8.0)

## 2017-01-27 LAB — COMPREHENSIVE METABOLIC PANEL
ALT: 13 U/L — ABNORMAL LOW (ref 17–63)
AST: 20 U/L (ref 15–41)
Albumin: 4.3 g/dL (ref 3.5–5.0)
Alkaline Phosphatase: 62 U/L (ref 38–126)
Anion gap: 8 (ref 5–15)
BUN: 24 mg/dL — ABNORMAL HIGH (ref 6–20)
CO2: 22 mmol/L (ref 22–32)
Calcium: 9.7 mg/dL (ref 8.9–10.3)
Chloride: 113 mmol/L — ABNORMAL HIGH (ref 101–111)
Creatinine, Ser: 2.83 mg/dL — ABNORMAL HIGH (ref 0.61–1.24)
GFR calc Af Amer: 29 mL/min — ABNORMAL LOW (ref >60)
GFR calc non Af Amer: 25 mL/min — ABNORMAL LOW (ref >60)
Glucose, Bld: 85 mg/dL (ref 65–99)
Potassium: 3.6 mmol/L (ref 3.5–5.1)
Sodium: 143 mmol/L (ref 135–145)
Total Bilirubin: 1.2 mg/dL (ref 0.3–1.2)
Total Protein: 7.3 g/dL (ref 6.5–8.1)

## 2017-01-27 LAB — URINE DRUG SCREEN, QUALITATIVE (ARMC ONLY)
AMPHETAMINES, UR SCREEN: NOT DETECTED
Barbiturates, Ur Screen: NOT DETECTED
Benzodiazepine, Ur Scrn: NOT DETECTED
COCAINE METABOLITE, UR ~~LOC~~: NOT DETECTED
Cannabinoid 50 Ng, Ur ~~LOC~~: NOT DETECTED
MDMA (ECSTASY) UR SCREEN: NOT DETECTED
Methadone Scn, Ur: NOT DETECTED
Opiate, Ur Screen: NOT DETECTED
PHENCYCLIDINE (PCP) UR S: NOT DETECTED
TRICYCLIC, UR SCREEN: NOT DETECTED

## 2017-01-27 LAB — CBC WITH DIFFERENTIAL/PLATELET
BASOS ABS: 0.1 10*3/uL (ref 0–0.1)
Basophils Relative: 1 %
EOS PCT: 2 %
Eosinophils Absolute: 0.1 10*3/uL (ref 0–0.7)
HCT: 37.5 % — ABNORMAL LOW (ref 40.0–52.0)
HEMOGLOBIN: 13.1 g/dL (ref 13.0–18.0)
LYMPHS PCT: 25 %
Lymphs Abs: 1 10*3/uL (ref 1.0–3.6)
MCH: 29.6 pg (ref 26.0–34.0)
MCHC: 34.9 g/dL (ref 32.0–36.0)
MCV: 85 fL (ref 80.0–100.0)
Monocytes Absolute: 0.3 10*3/uL (ref 0.2–1.0)
Monocytes Relative: 8 %
NEUTROS PCT: 64 %
Neutro Abs: 2.7 10*3/uL (ref 1.4–6.5)
PLATELETS: 196 10*3/uL (ref 150–440)
RBC: 4.42 MIL/uL (ref 4.40–5.90)
RDW: 13.7 % (ref 11.5–14.5)
WBC: 4.2 10*3/uL (ref 3.8–10.6)

## 2017-01-27 LAB — VALPROIC ACID LEVEL: Valproic Acid Lvl: <10 ug/mL — ABNORMAL LOW (ref 50.0–100.0)

## 2017-01-27 LAB — ACETAMINOPHEN LEVEL: Acetaminophen (Tylenol), Serum: <10 ug/mL — ABNORMAL LOW (ref 10–30)

## 2017-01-27 LAB — LIPASE, BLOOD: LIPASE: 29 U/L (ref 11–51)

## 2017-01-27 LAB — SALICYLATE LEVEL: Salicylate Lvl: <7 mg/dL (ref 2.8–30.0)

## 2017-01-27 LAB — LITHIUM LEVEL

## 2017-01-27 LAB — ETHANOL: Alcohol, Ethyl (B): <5 mg/dL (ref <5)

## 2017-01-27 MED ORDER — METOPROLOL SUCCINATE ER 50 MG PO TB24
ORAL_TABLET | ORAL | Status: AC
  Filled 2017-01-27: qty 1

## 2017-01-27 MED ORDER — LORAZEPAM 2 MG/ML IJ SOLN
INTRAMUSCULAR | Status: AC
  Administered 2017-01-27: 2 mg
  Filled 2017-01-27: qty 1

## 2017-01-27 MED ORDER — ACETAMINOPHEN 325 MG PO TABS
650.0000 mg | ORAL_TABLET | Freq: Four times a day (QID) | ORAL | Status: DC | PRN
  Administered 2017-02-01: 650 mg via ORAL
  Filled 2017-01-27: qty 2

## 2017-01-27 MED ORDER — CHLORPROMAZINE HCL 50 MG PO TABS: 50.0000 mg | ORAL_TABLET | ORAL | Status: DC | PRN

## 2017-01-27 MED ORDER — CLOZAPINE 100 MG PO TABS
300.0000 mg | ORAL_TABLET | Freq: Every day | ORAL | Status: DC
  Administered 2017-01-27: 300 mg via ORAL
  Filled 2017-01-27: qty 3

## 2017-01-27 MED ORDER — METOPROLOL SUCCINATE ER 50 MG PO TB24
25.0000 mg | ORAL_TABLET | Freq: Every day | ORAL | Status: DC
  Administered 2017-01-27: 25 mg via ORAL

## 2017-01-27 MED ORDER — HYDROXYZINE HCL 50 MG PO TABS
50.0000 mg | ORAL_TABLET | Freq: Three times a day (TID) | ORAL | Status: DC | PRN
  Administered 2017-01-30: 50 mg via ORAL
  Filled 2017-01-27 (×2): qty 1

## 2017-01-27 MED ORDER — CLOZAPINE 100 MG PO TABS: 300.0000 mg | ORAL_TABLET | Freq: Every day | ORAL | Status: DC

## 2017-01-27 MED ORDER — FAMOTIDINE 20 MG PO TABS: 20.0000 mg | ORAL_TABLET | Freq: Two times a day (BID) | ORAL | Status: DC

## 2017-01-27 MED ORDER — CLONAZEPAM 0.5 MG PO TABS
0.5000 mg | ORAL_TABLET | Freq: Three times a day (TID) | ORAL | Status: DC
  Administered 2017-01-28: 0.5 mg via ORAL
  Filled 2017-01-27: qty 1

## 2017-01-27 MED ORDER — METOPROLOL SUCCINATE ER 25 MG PO TB24
25.0000 mg | ORAL_TABLET | Freq: Every day | ORAL | Status: DC
  Administered 2017-01-28 – 2017-02-03 (×7): 25 mg via ORAL
  Filled 2017-01-27 (×7): qty 1

## 2017-01-27 MED ORDER — ALUM & MAG HYDROXIDE-SIMETH 200-200-20 MG/5ML PO SUSP: 30.0000 mL | ORAL | Status: DC | PRN

## 2017-01-27 MED ORDER — FAMOTIDINE 20 MG PO TABS
20.0000 mg | ORAL_TABLET | Freq: Two times a day (BID) | ORAL | Status: DC
  Administered 2017-01-28 – 2017-02-07 (×17): 20 mg via ORAL
  Filled 2017-01-27 (×23): qty 1

## 2017-01-27 MED ORDER — HALOPERIDOL LACTATE 5 MG/ML IJ SOLN
INTRAMUSCULAR | Status: AC
  Administered 2017-01-27: 5 mg
  Filled 2017-01-27: qty 1

## 2017-01-27 MED ORDER — CHLORPROMAZINE HCL 100 MG PO TABS
50.0000 mg | ORAL_TABLET | ORAL | Status: DC | PRN
  Filled 2017-01-27 (×3): qty 1

## 2017-01-27 MED ORDER — MAGNESIUM HYDROXIDE 400 MG/5ML PO SUSP: 30.0000 mL | Freq: Every day | ORAL | Status: DC | PRN

## 2017-01-27 MED ORDER — DIVALPROEX SODIUM 500 MG PO DR TAB
500.0000 mg | DELAYED_RELEASE_TABLET | Freq: Two times a day (BID) | ORAL | Status: DC
  Administered 2017-01-27: 500 mg via ORAL
  Filled 2017-01-27 (×4): qty 1

## 2017-01-27 MED ORDER — CLONAZEPAM 0.5 MG PO TABS: 0.5000 mg | ORAL_TABLET | Freq: Three times a day (TID) | ORAL | Status: DC

## 2017-01-27 MED ORDER — DIVALPROEX SODIUM 500 MG PO DR TAB: 500.0000 mg | DELAYED_RELEASE_TABLET | Freq: Two times a day (BID) | ORAL | Status: DC

## 2017-01-27 NOTE — ED Notes (Signed)
Pt changed out into purple scrubs and non-skid socks; pt's belongings (grey fleece jogging pants, one pair blue socks, one pair blue underwear, one blue t-shirt, one pair of sneakers) bagged and labeled. Belongings will be placed in the [employer]. At this time, pt is calm and cooperative.

## 2017-01-27 NOTE — ED Notes (Signed)
Dr Windell Moulding at bedside.

## 2017-01-27 NOTE — ED Notes (Signed)
Spoke with Garry Heater, RN of Behavioral Medicine; report about patient given; Garry Heater asked if we can wait until 2130 to transfer the patient, allowing them time to get the meds done for their current patients.

## 2017-01-27 NOTE — ED Triage Notes (Addendum)
Pt arrived via EMS.  EMS reports mental health worker at pts house and pt got mad because he did not have food and something about his disability check was messed up.  Mental health worker offered to get pt bologna sandwich but per EMS he wanted hospital food and insisted on coming.   In triage pt has changed story multiple times.  Has pressured speech and almost appears manic.  Pt initially stated was here for blood in urine which nephrologist told him he had blood in urine a week ago. When verified that is why he is here he now c/o dizziness, weakness, vomiting, blood in urine, and not feeling well.  Pt states "that is why the [employer] were fucking with people, you don't believe me. [place] is crazy, I don't like it anymore. I don't like police I will be in jail, I won't let the white face talk to me any more. If you don't send me to [place]. The white face trying to tell me what to do, the white face will trick you.".  Pt continues to go on talking like above quoted paragraph.  Denies SI/HI at this time. Also reports that someone keeps trying to give him extra pills but he is not going to take them

## 2017-01-27 NOTE — ED Notes (Signed)
Pt originally placed in [place] and started hollering for white paper. Doctor Rip Harbour came and spoke with pt and pt gave a urine sample. RN, Grayland Ormond was talking with pt when the pt became upset and started yelling white face, white face and starring at EMS workers across the nurses station. Pt received two shots from RN willingly and this tech ask the pt if he would like to move to a room with a TV and pt stated yes and we moved him to room 20. Pt was given a sandwich tray and an extra supper tray that came as well as the TV remote and a warm blanket. Pt has calmed down and is eating at this time. Pt does randomly looked out the door and say I'm not crazy. Pt urine was sent earlier to lab.

## 2017-01-27 NOTE — ED Notes (Signed)
Pt transferred to ED 20; pt given sandwich tray and [employer]. Pt appears to be calming down now and is cooperative.

## 2017-01-27 NOTE — Consult Note (Signed)
[facility] Consult   Reason for Consult:  Consult for 51 year old man with a history of schizoaffective disorder brought to the hospital this afternoon after his act team became concerned about him Referring Physician:  Cinda Quest Patient Identification: Nicholas Cole MRN:  [id number] Principal Diagnosis: Schizoaffective disorder, bipolar type [facility]) Diagnosis:   Patient Active Problem List   Diagnosis Date Noted  . Chronic renal failure [N18.9] 01/27/2017  . Hypertension [I10] 01/27/2017  . Lithium toxicity [T56.891A] 11/06/2016  . ARF (acute renal failure) ([place]) [N17.9] 11/05/2016  . Dehydration [E86.0] 11/05/2016  . Rhabdomyolysis [M62.82] 11/05/2016  . Schizoaffective disorder, bipolar type ([place]) [F25.0] 11/05/2016    Total Time spent with patient: 1 hour  Subjective:   Nicholas Cole is a 51 y.o. male patient admitted with "they said I got blood in my urine".  HPI:  Patient interviewed chart reviewed. Patient known to me from previous encounters. 51 year old man with schizoaffective disorder. The patient says that he came to the hospital because of blood in his urine. Judging from the triage notes it looks like he called EMS himself after becoming agitated when his act team worker came to visit him. Patient is very energetic with pressured speech and flight of ideas and tangential thinking making it very difficult to get specific information. He tells me that he has been feeling tired and run down recently. Also tells me that his mood has been not so good without any specifics. He admits that he sleeps poorly at night waking up multiple times in the night and unable to get back to sleep. He is vague about whether he has been able to eat and take care of his health very well. Patient freely admits that he has auditory hallucinations which have been a long-standing issue for him. I tried to get some clarity as to whether he was taking his medications and which ones. Patient  apparently claims that he is still taking his "usual" medicines by which I believe he means his Depakote and his clozapine  and Klonopin but that somebody tried to give him some "new" medicines and he threw those in the trash. I'm not exactly sure what that was. Possibly the Trilafon that I see on his medicine list. It's not clear to me whether he is currently prescribed lithium. Ever since his lithium toxicity and worsening renal function that has been on and off. We are still waiting for the lithium level. Patient denies to me any thoughts about hurting anyone but he ramps up very quickly and made some intimidating comments to some of the staff although he isn't acting out on any of it. Denies any substance abuse.  Social history: Patient lives in an independent apartment and has act team support. Doesn't really have any support from his family. His life in the community is at times precarious because of his own behavior.  Medical history: Patient has chronic renal failure which looks like it may be getting worse. Earlier this year he had a hospitalization for lithium toxicity and after recovering from that his creatinine has remained up around 2. Today it is creeping up towards 3. He also has high blood pressure. The patient insists that he has blood in his urine. I looked at his note from his nephrology visit yesterday and there is no sign of any blood in his urine at that time. I'm not entirely sure what the concern is although he does have a chronic anemia. It looks like they might of been working him  up for a monoclonal antibody but nothing was found  Substance abuse history: Patient does not have a history of an active substance abuse problem that has been part of the issue  Past Psychiatric History: Long-standing mental health problems diagnosis schizoaffective disorder. Multiple hospitalizations including state hospitalizations. No history that I know of suicide attempts. Has been threatening and  aggressive at times in the past. Patient is on clozapine and Depakote. I am not sure whether lithium is still being used given his worsening renal function.  Risk to Self: Is patient at risk for suicide?: No Risk to Others:   Prior Inpatient Therapy:   Prior Outpatient Therapy:    Past Medical History:  Past Medical History:  Diagnosis Date  . Acute renal failure (ARF) ([place])   . Schizophrenia ([place])    History reviewed. No pertinent surgical history. Family History: History reviewed. No pertinent family history. Family Psychiatric  History: He does not know of any Social History:  History  Alcohol Use No     History  Drug Use No    Social History   Social History  . Marital status: Single    Spouse name: N/A  . Number of children: N/A  . Years of education: N/A   Social History Main Topics  . Smoking status: Former Research scientist (life sciences)  . Smokeless tobacco: Never Used  . Alcohol use No  . Drug use: No  . Sexual activity: Not Asked   Other Topics Concern  . None   Social History Narrative  . None   Additional Social History:    Allergies:  No Known Allergies  Labs:  Results for orders placed or performed during the hospital encounter of 01/27/17 (from the past 48 hour(s))  Urinalysis, Complete w Microscopic     Status: Abnormal   Collection Time: 01/27/17  4:05 PM  Result Value Ref Range   Color, Urine YELLOW (A) YELLOW   APPearance CLEAR (A) CLEAR   Specific Gravity, Urine 1.015 1.005 - 1.030   pH 5.0 5.0 - 8.0   Glucose, UA NEGATIVE NEGATIVE mg/dL   Hgb urine dipstick NEGATIVE NEGATIVE   Bilirubin Urine NEGATIVE NEGATIVE   Ketones, ur NEGATIVE NEGATIVE mg/dL   Protein, ur NEGATIVE NEGATIVE mg/dL   Nitrite NEGATIVE NEGATIVE   Leukocytes, UA NEGATIVE NEGATIVE   RBC / HPF 0-5 0 - 5 RBC/hpf   WBC, UA 0-5 0 - 5 WBC/hpf   Bacteria, UA NONE SEEN NONE SEEN   Squamous Epithelial / LPF NONE SEEN NONE SEEN   Mucus PRESENT    Hyaline Casts, UA PRESENT   Lipase, blood      Status: None   Collection Time: 01/27/17  4:05 PM  Result Value Ref Range   Lipase 29 11 - 51 U/L  Comprehensive metabolic panel     Status: Abnormal   Collection Time: 01/27/17  4:05 PM  Result Value Ref Range   Sodium 143 135 - 145 mmol/L   Potassium 3.6 3.5 - 5.1 mmol/L   Chloride 113 (H) 101 - 111 mmol/L   CO2 22 22 - 32 mmol/L   Glucose, Bld 85 65 - 99 mg/dL   BUN 24 (H) 6 - 20 mg/dL   Creatinine, Ser 2.83 (H) 0.61 - 1.24 mg/dL   Calcium 9.7 8.9 - 10.3 mg/dL   Total Protein 7.3 6.5 - 8.1 g/dL   Albumin 4.3 3.5 - 5.0 g/dL   AST 20 15 - 41 U/L   ALT 13 (L) 17 - 63 U/L  Alkaline Phosphatase 62 38 - 126 U/L   Total Bilirubin 1.2 0.3 - 1.2 mg/dL   GFR calc non Af Amer 25 (L) >60 mL/min   GFR calc Af Amer 29 (L) >60 mL/min    Comment: (NOTE) The eGFR has been calculated using the CKD EPI equation. This calculation has not been validated in all clinical situations. eGFR's persistently <60 mL/min signify possible Chronic Kidney Disease.    Anion gap 8 5 - 15  Acetaminophen level     Status: Abnormal   Collection Time: 01/27/17  4:05 PM  Result Value Ref Range   Acetaminophen (Tylenol), Serum <10 (L) 10 - 30 ug/mL    Comment:        THERAPEUTIC CONCENTRATIONS VARY SIGNIFICANTLY. A RANGE OF 10-30 ug/mL MAY BE AN EFFECTIVE CONCENTRATION FOR MANY PATIENTS. HOWEVER, SOME ARE BEST TREATED AT CONCENTRATIONS OUTSIDE THIS RANGE. ACETAMINOPHEN CONCENTRATIONS >150 ug/mL AT 4 HOURS AFTER INGESTION AND >50 ug/mL AT 12 HOURS AFTER INGESTION ARE OFTEN ASSOCIATED WITH TOXIC REACTIONS.   Ethanol     Status: None   Collection Time: 01/27/17  4:05 PM  Result Value Ref Range   Alcohol, Ethyl (B) <5 <5 mg/dL    Comment:        LOWEST DETECTABLE LIMIT FOR SERUM ALCOHOL IS 5 mg/dL FOR MEDICAL PURPOSES ONLY   Salicylate level     Status: None   Collection Time: 01/27/17  4:05 PM  Result Value Ref Range   Salicylate Lvl <0.1 2.8 - 30.0 mg/dL  CBC with Differential     Status:  Abnormal   Collection Time: 01/27/17  4:05 PM  Result Value Ref Range   WBC 4.2 3.8 - 10.6 K/uL   RBC 4.42 4.40 - 5.90 MIL/uL   Hemoglobin 13.1 13.0 - 18.0 g/dL   HCT 37.5 (L) 40.0 - 52.0 %   MCV 85.0 80.0 - 100.0 fL   MCH 29.6 26.0 - 34.0 pg   MCHC 34.9 32.0 - 36.0 g/dL   RDW 13.7 11.5 - 14.5 %   Platelets 196 150 - 440 K/uL   Neutrophils Relative % 64 %   Neutro Abs 2.7 1.4 - 6.5 K/uL   Lymphocytes Relative 25 %   Lymphs Abs 1.0 1.0 - 3.6 K/uL   Monocytes Relative 8 %   Monocytes Absolute 0.3 0.2 - 1.0 K/uL   Eosinophils Relative 2 %   Eosinophils Absolute 0.1 0 - 0.7 K/uL   Basophils Relative 1 %   Basophils Absolute 0.1 0 - 0.1 K/uL  Valproic acid level     Status: Abnormal   Collection Time: 01/27/17  4:05 PM  Result Value Ref Range   Valproic Acid Lvl <10 (L) 50.0 - 100.0 ug/mL    Current Facility-Administered Medications  Medication Dose Route Frequency Provider Last Rate Last Dose  . chlorproMAZINE (THORAZINE) tablet 50 mg  50 mg Oral Q4H PRN Marshall Roehrich T, MD      . clonazePAM (KLONOPIN) tablet 0.5 mg  0.5 mg Oral TID Logan Baltimore T, MD      . cloZAPine (CLOZARIL) tablet 300 mg  300 mg Oral QHS Arlee Santosuosso T, MD      . divalproex (DEPAKOTE) DR tablet 500 mg  500 mg Oral Q12H Ruthella Kirchman T, MD      . famotidine (PEPCID) tablet 20 mg  20 mg Oral BID Kyliyah Stirn T, MD      . metoprolol succinate (TOPROL-XL) 24 hr tablet 25 mg  25 mg Oral Daily  Jameis Newsham, Madie Reno, MD       Current Outpatient Prescriptions  Medication Sig Dispense Refill  . clonazePAM (KLONOPIN) 0.5 MG tablet Take 1 tablet (0.5 mg total) by mouth 3 (three) times daily. 90 tablet 0  . cloZAPine (CLOZARIL) 100 MG tablet Take 3 tablets (300 mg total) by mouth daily. 90 tablet 0  . divalproex (DEPAKOTE) 500 MG DR tablet Take 1 tablet (500 mg total) by mouth every 12 (twelve) hours. 60 tablet 0  . metoprolol succinate (TOPROL-XL) 25 MG 24 hr tablet Take 1 tablet (25 mg total) by mouth daily. 30 tablet 0   . perphenazine (TRILAFON) 4 MG tablet Take 1 tablet (4 mg total) by mouth at bedtime. 30 tablet 0  . ranitidine (ZANTAC) 150 MG tablet Take 150 mg by mouth 2 (two) times daily.      Musculoskeletal: Strength & Muscle Tone: within normal limits Gait & Station: normal Patient leans: N/A  Psychiatric Specialty Exam: Physical Exam  Nursing note and vitals reviewed. Constitutional: He appears well-developed and well-nourished.  HENT:  Head: Normocephalic and atraumatic.  Eyes: Pupils are equal, round, and reactive to light. Conjunctivae are normal.  Neck: Normal range of motion.  Cardiovascular: Regular rhythm and normal heart sounds.   Respiratory: Effort normal. No respiratory distress.  GI: Soft.  Musculoskeletal: Normal range of motion.  Neurological: He is alert.  Skin: Skin is warm and dry.  Psychiatric: His affect is labile and inappropriate. His speech is rapid and/or pressured and tangential. He is agitated. He is not aggressive. Thought content is paranoid. Cognition and memory are impaired. He expresses impulsivity. He expresses no homicidal and no suicidal ideation.    Review of Systems  Constitutional: Positive for malaise/fatigue.  HENT: Negative.   Eyes: Negative.   Respiratory: Negative.   Cardiovascular: Negative.   Gastrointestinal: Negative.   Musculoskeletal: Negative.   Skin: Negative.   Neurological: Negative.   Psychiatric/Behavioral: Positive for hallucinations. Negative for depression, memory loss, substance abuse and suicidal ideas. The patient is nervous/anxious and has insomnia.     Blood pressure (!) 151/71, pulse (!) 102, temperature 98.2 F (36.8 C), temperature source Oral, resp. rate 18, height '5\' 9"'$  (1.753 m), weight 196 lb (88.9 kg), SpO2 98 %.Body mass index is 28.94 kg/m.  General Appearance: Casual  Eye Contact:  Good  Speech:  Pressured  Volume:  Increased  Mood:  Irritable  Affect:  Labile  Thought Process:  Disorganized   Orientation:  Full (Time, Place, and Person)  Thought Content:  Illogical, Hallucinations: Auditory, Paranoid Ideation, Rumination and Tangential  Suicidal Thoughts:  No  Homicidal Thoughts:  No  Memory:  Immediate;   Fair Recent;   Poor Remote;   Fair  Judgement:  Impaired  Insight:  Fair  Psychomotor Activity:  Restlessness  Concentration:  Concentration: Poor  Recall:  [employer] of Knowledge:  Fair  Language:  Fair  Akathisia:  No  Handed:  Right  AIMS (if indicated):     Assets:  Desire for Improvement Housing Resilience Social Support  ADL's:  Intact  Cognition:  Impaired,  Mild  Sleep:        Treatment Plan Summary: Daily contact with patient to assess and evaluate symptoms and progress in treatment, Medication management and Plan 51 year old man with schizoaffective disorder who currently seems to be hyperactive hyperverbal very disorganized. Really wasn't even possible to have a coherent interview or plan with them. On the other hand he is not assaulting anyone nor is  he threatening any violence and he does agree to treatment. Although many times recently we have discharged him from the emergency room I think at this point it would be appropriate to admit him to the psychiatric service to really see if we can get some improvement in his symptoms. Orders completed. When necessary medicines available. Labs will be done. 15 minute checks. Patient is agreeable to the plan. Case reviewed with TTS and emergency room physician. Continue IVC  Disposition: Recommend psychiatric Inpatient admission when medically cleared. Supportive therapy provided about ongoing stressors.  Alethia Berthold, MD 01/27/2017 6:09 PM

## 2017-01-27 NOTE — ED Notes (Signed)
Pt discharged to [facility] Medicine Unit.

## 2017-01-27 NOTE — ED Notes (Signed)
Pt sitting on bed eating chips and bright light was cut off. Cr. Clapac with pt at this time.

## 2017-01-27 NOTE — ED Notes (Signed)
Pt asked for and was given chocolate milk and graham crackers.

## 2017-01-27 NOTE — Plan of Care (Signed)
Problem: Coping: Goal: Ability to verbalize feelings will improve Outcome: Progressing Patient is able to verbalize understanding of his disease processes and medication regimen   Problem: Coping: Goal: Ability to verbalize frustrations and anger appropriately will improve Outcome: Progressing Patient is participating in groups therapy socialize with peers with out issues  Problem: Safety: Goal: Periods of time without injury will increase Outcome: Progressing Patient is free of harm to self and others, and contract safety with care team at this time

## 2017-01-27 NOTE — ED Notes (Signed)
When attempting to complete an initial assessment of the patient, patient became extremely irritated, hostile, and began to speak very loudly and yell at the top of his lungs. Pt states, "I'm not crazy, and don't want to talk to the white face, the Indians knew, knew about the white face" At this point, Dr. Rip Harbour came over and with this RN calmed the patient down a little bit, and gave a verbal order for 5mg  Haldol and 2mg  Ativan.

## 2017-01-27 NOTE — ED Provider Notes (Signed)
[facility] Emergency Department Provider Note   ____________________________________________   First MD Initiated Contact with Patient 01/27/17 [phone]     (approximate)  I have reviewed the triage vital signs and the nursing notes.   HISTORY  Chief Complaint Hematuria   HPI Nicholas Cole is a 51 y.o. male Who comes in complaining of hematuria. He says he didn't know he had it that he's had it for 2 months. I went to see them rather rapidly because he was sitting on the stretcher by himself and started yelling white face, white face, White face in a very loud voice. urse's notes reviewed I am taking out papers on him he says he doesn't want anything but closet pain he doesn't take his Depakote. He is not taking lithium. He says.  Patient and says he is tired of living he started being in [place] he started [place] while he is talking to another nurse   Past Medical History:  Diagnosis Date  . Acute renal failure (ARF) ([place])   . Schizophrenia [employer])     Patient Active Problem List   Diagnosis Date Noted  . Chronic renal failure 01/27/2017  . Hypertension 01/27/2017  . Lithium toxicity 11/06/2016  . ARF (acute renal failure) ([place]) 11/05/2016  . Dehydration 11/05/2016  . Rhabdomyolysis 11/05/2016  . Schizoaffective disorder, bipolar type ([place]) 11/05/2016    History reviewed. No pertinent surgical history.  Prior to Admission medications   Medication Sig Start Date End Date Taking? Authorizing Provider  clonazePAM (KLONOPIN) 0.5 MG tablet Take 1 tablet (0.5 mg total) by mouth 3 (three) times daily. 11/11/16   Loletha Grayer, MD  cloZAPine (CLOZARIL) 100 MG tablet Take 3 tablets (300 mg total) by mouth daily. 11/11/16   Loletha Grayer, MD  divalproex (DEPAKOTE) 500 MG DR tablet Take 1 tablet (500 mg total) by mouth every 12 (twelve) hours. 11/11/16   Loletha Grayer, MD  metoprolol succinate (TOPROL-XL) 25 MG 24 hr tablet Take 1 tablet (25 mg  total) by mouth daily. 11/11/16   Loletha Grayer, MD  perphenazine (TRILAFON) 4 MG tablet Take 1 tablet (4 mg total) by mouth at bedtime. 11/11/16   Loletha Grayer, MD  ranitidine (ZANTAC) 150 MG tablet Take 150 mg by mouth 2 (two) times daily.    [provider]    Allergies Patient has no known allergies.  History reviewed. No pertinent family history.  Social History Social History  Substance Use Topics  . Smoking status: Former Research scientist (life sciences)  . Smokeless tobacco: Never Used  . Alcohol use No    Review of Systems  Constitutional: No fever/chills Eyes: No visual changes. ENT: No sore throat. Cardiovascular: Denies chest pain. Respiratory: Denies shortness of breath. Gastrointestinal: No abdominal pain.  No nausea, no vomiting.  No diarrhea.  No constipation. Genitourinary: Negative for dysuria. Musculoskeletal: Negative for back pain. Skin: Negative for rash. Neurological: Negative for headaches, focal weakness   ____________________________________________   PHYSICAL EXAM:  VITAL SIGNS: ED Triage Vitals [01/27/17 1603]  Enc Vitals Group     BP (!) 151/71     Pulse Rate (!) 102     Resp 18     Temp 98.2 F (36.8 C)     Temp Source Oral     SpO2 98 %     Weight 196 lb (88.9 kg)     Height 5\' 9"  (1.753 m)     Head Circumference      Peak Flow      Pain  Score      Pain Loc      Pain Edu?      Excl. in [place]?     Constitutional: Alert and oriented. Well appearing and in no acute distress. Eyes: Conjunctivae are normal. Head: Atraumatic. Nose: No congestion/rhinnorhea. Mouth/Throat: Mucous membranes are moist.  Oropharynx non-erythematous. Neck: No stridor. Cardiovascular: Normal rate, regular rhythm. Grossly normal heart sounds.  Good peripheral circulation. Respiratory: Normal respiratory effort.  No retractions. Lungs CTAB. Gastrointestinal: Soft and nontender. No distention. No abdominal bruits. No CVA tenderness. Musculoskeletal: No lower extremity  tenderness nor edema.  No joint effusions. Neurologic:  Normal speech and language. No gross focal neurologic deficits are appreciated. No gait instability. Skin:  Skin is warm, dry and intact. No rash noted.   ____________________________________________   LABS (all labs ordered are listed, but only abnormal results are displayed)  Labs Reviewed  URINALYSIS, COMPLETE (UACMP) WITH MICROSCOPIC - Abnormal; Notable for the following:       Result Value   Color, Urine YELLOW (*)    APPearance CLEAR (*)    All other components within normal limits  COMPREHENSIVE METABOLIC PANEL - Abnormal; Notable for the following:    Chloride 113 (*)    BUN 24 (*)    Creatinine, Ser 2.83 (*)    ALT 13 (*)    GFR calc non Af Amer 25 (*)    GFR calc Af Amer 29 (*)    All other components within normal limits  ACETAMINOPHEN LEVEL - Abnormal; Notable for the following:    Acetaminophen (Tylenol), Serum <10 (*)    All other components within normal limits  CBC WITH DIFFERENTIAL/PLATELET - Abnormal; Notable for the following:    HCT 37.5 (*)    All other components within normal limits  VALPROIC ACID LEVEL - Abnormal; Notable for the following:    Valproic Acid Lvl <10 (*)    All other components within normal limits  LIPASE, BLOOD  ETHANOL  SALICYLATE LEVEL  URINE DRUG SCREEN, QUALITATIVE ([facility] ONLY)  LITHIUM LEVEL   ____________________________________________  EKG   ____________________________________________  RADIOLOGY  No results found.  ____________________________________________   PROCEDURES  Procedure(s) performed:   Procedures  Critical Care performed:  ____________________________________________   INITIAL IMPRESSION / ASSESSMENT AND PLAN / ED COURSE  Pertinent labs & imaging results that were available during my care of the patient were reviewed by me and considered in my medical decision making (see chart for details).    Clinical Course as of Jan 28 1823    Fri Jan 27, 2017  1823 Color, Urine: Jaquelyn Bitter [PM]    Clinical Course User Index [PM] Nena Polio, MD     ____________________________________________   FINAL CLINICAL IMPRESSION(S) / ED DIAGNOSES  Final diagnoses:  Schizoaffective disorder, unspecified type ([place])      NEW MEDICATIONS STARTED DURING THIS VISIT:  New Prescriptions   No medications on file     Note:  This document was prepared using Dragon voice recognition software and may include unintentional dictation errors.    Nena Polio, MD 01/27/17 [phone]

## 2017-01-27 NOTE — Progress Notes (Signed)
MEDICATION RELATED CONSULT NOTE - INITIAL   Pharmacy Consult for Cloazpine REMS reporting and lab monitoring   No Known Allergies  Patient Measurements: Height: 5\' 9"  (175.3 cm) Weight: 196 lb (88.9 kg) IBW/kg (Calculated) : 70.7  Vital Signs: Temp: 98.2 F (36.8 C) (08/31 1603) Temp Source: Oral (08/31 1603) BP: 151/71 (08/31 1603) Pulse Rate: 102 (08/31 1603) Intake/Output from previous day: No intake/output data recorded. Intake/Output from this shift: No intake/output data recorded.  Labs:  Recent Labs  01/27/17 1605  WBC 4.2  HGB 13.1  HCT 37.5*  PLT 196  CREATININE 2.83*  ALBUMIN 4.3  PROT 7.3  AST 20  ALT 13*  ALKPHOS 62  BILITOT 1.2   Estimated Creatinine Clearance: 34.8 mL/min (A) (by C-G formula based on SCr of 2.83 mg/dL (H)).   Microbiology: No results found for this or any previous visit (from the past 720 hour(s)).  Medical History: Past Medical History:  Diagnosis Date  . Acute renal failure (ARF) ([place])   . Schizophrenia [facility])     Assessment: 51 yo male with PMH of Schizophrenia. Pharmacy consulted for Clozapine REMS reporting and laboratory monitoring.   Plan:  8/31 ANC: 2700. Labs were reported to Clozapine REMS program per protocol. Patient listed as eligible to receive clozapine with weekly lab monitoring. Plan for next  CBC w/Diff on 9/7.  Pernell Dupre, PharmD, BCPS Clinical Pharmacist 01/27/2017 7:20 PM

## 2017-01-27 NOTE — BH Assessment (Signed)
Assessment Note  Nicholas Cole is an 51 y.o. male who presents to the ER due to concerns of having blood in his urine. He states he was seen earlier today by his PCP and if he have any problems to come to the ER. Patient states, he came to the ER because he was feeling dizzy. He reports of having V/H and it's ongoing. "I know it's not real but sometimes I can't take it. I just stay in the house."  During the interview, the patient was able to provide appropriate answers to the questions. At time he was hyper-verbal and to be redirected. Patient denies SI/HI and A/H.  Diagnosis: Schizoaffective Disorder  Past Medical History:  Past Medical History:  Diagnosis Date  . Acute renal failure (ARF) ([place])   . Schizophrenia ([place])     History reviewed. No pertinent surgical history.  Family History: History reviewed. No pertinent family history.  Social History:  reports that he has quit smoking. He has never used smokeless tobacco. He reports that he does not drink alcohol or use drugs.  Additional Social History:  Alcohol / Drug Use Pain Medications: See PTA Prescriptions: See PTA Over the Counter: See PTA History of alcohol / drug use?: No history of alcohol / drug abuse Longest period of sobriety (when/how long): Reports of none Negative Consequences of Use:  (n/a) Withdrawal Symptoms:  (n/a)  CIWA: CIWA-Ar BP: (!) 151/71 Pulse Rate: (!) 102 COWS:    Allergies: No Known Allergies  Home Medications:  (Not in a hospital admission)  OB/GYN Status:  No LMP for male patient.  General Assessment Data Assessment unable to be completed: Yes Location of Assessment: [facility] ED TTS Assessment: In system Is this a Tele or Face-to-Face Assessment?: Face-to-Face Is this an Initial Assessment or a Re-assessment for this encounter?: Initial Assessment Marital status: Single Maiden name: n/a Is patient pregnant?: No Pregnancy Status: No Living Arrangements: Alone Can pt return to current  living arrangement?: Yes Admission Status: Involuntary Is patient capable of signing voluntary admission?: No Referral Source: Self/Family/Friend Insurance type: [facility] MCR  Medical Screening Exam ([place]) Medical Exam completed: Yes  Crisis Care Plan Living Arrangements: Alone Legal Guardian: Other: (Self) Name of Psychiatrist: Reports of none Name of Therapist: Reports of none  Education Status Is patient currently in school?: No Current Grade: n/a Highest grade of school patient has completed: n/a Name of school: n/a Contact person: n/a  Risk to self with the past 6 months Suicidal Ideation: No Has patient been a risk to self within the past 6 months prior to admission? : No Suicidal Intent: No Has patient had any suicidal intent within the past 6 months prior to admission? : No Is patient at risk for suicide?: No Suicidal Plan?: No Has patient had any suicidal plan within the past 6 months prior to admission? : No Access to Means: No What has been your use of drugs/alcohol within the last 12 months?: Reports of none Previous Attempts/Gestures: No How many times?: 0 Other Self Harm Risks: Reports of none Triggers for Past Attempts: None known Intentional Self Injurious Behavior: None Family Suicide History: No Recent stressful life event(s): Other (Comment) Persecutory voices/beliefs?: No Depression: No Depression Symptoms:  (Reports of none) Substance abuse history and/or treatment for substance abuse?: No Suicide prevention information given to non-admitted patients: Not applicable  Risk to Others within the past 6 months Homicidal Ideation: No Does patient have any lifetime risk of violence toward others beyond the six months  prior to admission? : No Thoughts of Harm to Others: No Current Homicidal Intent: No Current Homicidal Plan: No Access to Homicidal Means: No Identified Victim: Reports of none History of harm to others?: No Assessment of  Violence: None Noted Violent Behavior Description: Reports of none Does patient have access to weapons?: No Criminal Charges Pending?: No Does patient have a court date: No Is patient on probation?: No  Psychosis Hallucinations: Visual Delusions: None noted  Mental Status Report Appearance/Hygiene: Unremarkable, In scrubs Eye Contact: Fair Motor Activity: Freedom of movement, Unremarkable Speech: Logical/coherent, Unremarkable Level of Consciousness: Alert Mood: Depressed, Anxious, Pleasant, Sad Affect: Appropriate to circumstance, Depressed, Sad Anxiety Level: None Thought Processes: Coherent, Relevant Judgement: Unimpaired Orientation: Person, Place, Time, Situation, Appropriate for developmental age Obsessive Compulsive Thoughts/Behaviors: Minimal  Cognitive Functioning Concentration: Normal Memory: Recent Intact, Remote Intact IQ: Average Insight: Fair Impulse Control: Fair Appetite: Fair Weight Loss: 0 Weight Gain: 0 Sleep: No Change Total Hours of Sleep: 8 Vegetative Symptoms: None  ADLScreening [facility] Assessment Services) Patient's cognitive ability adequate to safely complete daily activities?: Yes Patient able to express need for assistance with ADLs?: Yes Independently performs ADLs?: Yes (appropriate for developmental age)  Prior Inpatient Therapy Prior Inpatient Therapy: Yes Prior Therapy Dates: Multiple Hospitalizations  Prior Therapy Facilty/Provider(s): Multiple Hospitalizations  Reason for Treatment: Schizoaffective Disorder  Prior Outpatient Therapy Prior Outpatient Therapy: Yes Prior Therapy Dates: Current Prior Therapy Facilty/Provider(s): [facility] Reason for Treatment: Schizoaffective Disorder Does patient have an ACCT team?: Yes Does patient have Intensive In-House Services?  : No Does patient have [facility] services? : No Does patient have P4CC services?: No  ADL Screening (condition at time of admission) Patient's  cognitive ability adequate to safely complete daily activities?: Yes Is the patient deaf or have difficulty hearing?: No Does the patient have difficulty seeing, even when wearing glasses/contacts?: No Does the patient have difficulty concentrating, remembering, or making decisions?: No Patient able to express need for assistance with ADLs?: Yes Does the patient have difficulty dressing or bathing?: No Independently performs ADLs?: Yes (appropriate for developmental age) Does the patient have difficulty walking or climbing stairs?: No Weakness of Legs: None Weakness of Arms/Hands: None  Home Assistive Devices/Equipment Home Assistive Devices/Equipment: None  Therapy Consults (therapy consults require a physician order) PT Evaluation Needed: No OT Evalulation Needed: No SLP Evaluation Needed: No Abuse/Neglect Assessment (Assessment to be complete while patient is alone) Physical Abuse: Denies Verbal Abuse: Denies Sexual Abuse: Denies Exploitation of patient/patient's resources: Denies Self-Neglect: Denies Values / Beliefs Cultural Requests During Hospitalization: None Spiritual Requests During Hospitalization: None Consults Spiritual Care Consult Needed: No Social Work Consult Needed: No Regulatory affairs officer (For Healthcare) Does Patient Have a Medical Advance Directive?: No    Additional Information 1:1 In Past 12 Months?: No CIRT Risk: No Elopement Risk: No Does patient have medical clearance?: Yes  Child/Adolescent Assessment Running Away Risk: Denies (Patient is adult)  Disposition:  Disposition Initial Assessment Completed for this Encounter: Yes Disposition of Patient: Other dispositions (ER MD Ordered Psych Consult)  On Site Evaluation by:   Reviewed with Physician:    Gunnar Fusi MS, LCAS, LPC, [place], CCSI Therapeutic Triage Specialist 01/27/2017 7:26 PM

## 2017-01-28 LAB — LIPID PANEL
CHOLESTEROL: 113 mg/dL (ref 0–200)
HDL: 20 mg/dL — ABNORMAL LOW (ref >40)
LDL Cholesterol: 52 mg/dL (ref 0–99)
TRIGLYCERIDES: 203 mg/dL — AB (ref <150)
Total CHOL/HDL Ratio: 5.7 RATIO
VLDL: 41 mg/dL — ABNORMAL HIGH (ref 0–40)

## 2017-01-28 LAB — TSH: TSH: 1.261 u[IU]/mL (ref 0.350–4.500)

## 2017-01-28 LAB — HEMOGLOBIN A1C
Hgb A1c MFr Bld: 4.4 % — ABNORMAL LOW (ref 4.8–5.6)
Mean Plasma Glucose: 79.58 mg/dL

## 2017-01-28 MED ORDER — CLONAZEPAM 1 MG PO TABS
1.5000 mg | ORAL_TABLET | Freq: Every day | ORAL | Status: DC
  Administered 2017-01-28 – 2017-01-30 (×3): 1.5 mg via ORAL
  Filled 2017-01-28 (×3): qty 1

## 2017-01-28 MED ORDER — CLONAZEPAM 1 MG PO TABS: 1.0000 mg | ORAL_TABLET | Freq: Every day | ORAL | Status: DC

## 2017-01-28 MED ORDER — CLOZAPINE 25 MG PO TABS
150.0000 mg | ORAL_TABLET | Freq: Two times a day (BID) | ORAL | Status: DC
  Administered 2017-01-28 – 2017-02-01 (×7): 150 mg via ORAL
  Filled 2017-01-28 (×8): qty 1

## 2017-01-28 NOTE — BHH Group Notes (Signed)
[place] LCSW Group Therapy  01/28/2017 2:55 PM  Type of Therapy:  Group Therapy  Participation Level:  Minimal  Participation Quality:  Attentive  Affect:  Blunted  Cognitive:  Alert and Lacking  Insight:  None  Engagement in Therapy:  Limited  Modes of Intervention:  Activity, Clarification, Discussion, Education and Support  Summary of Progress/Problems: Goal Setting: The objective is to set goals as they relate to the crisis in which they were admitted. Patients will be using SMART goal modalities to set measurable goals. Characteristics of realistic goals will be discussed and patients will be assisted in setting and processing how one will reach their goal. Facilitator will also assist patients in applying interventions and coping skills learned in psycho-education groups to the SMART goal and process how one will achieve defined goal. Patient was mostly quiet in group till the end when he started talking with another peer about housing stating "You should get an ACTT team they own the hospital and can give you money for housing. CSW attempted to redirect patient however he continued to talk over CSW and became loud. CSW ended group early. Patient eventually left group room after much redirection.   Gavrielle Streck G. [place], [place] 01/28/2017, 3:05 PM

## 2017-01-28 NOTE — Progress Notes (Signed)
Nicholas Cole, Nicholas Cole is a known patient to [place] from his previous visits to this department treated for disorganized thoughts and hearing voices, now he is back and claiming that he is seeing blood in his urine, upon assessment it appears BP is elevated but no confirmed blood in the urine at this time. Metoprolol was administered to patient on admission and the on call dr. was made aware. Patient is now stable and contract safety with care team, assessment is complete.

## 2017-01-28 NOTE — Plan of Care (Signed)
Problem: Safety: Goal: Periods of time without injury will increase Outcome: Progressing Patient remains safe and without injury during hospitalization and on Q 15 minute observation. Will continue to monitor patient.    

## 2017-01-28 NOTE — BHH Suicide Risk Assessment (Signed)
[facility] Admission Suicide Risk Assessment   Nursing information obtained from:  Patient Demographic factors:  Male Current Mental Status:  NA Loss Factors:  NA Historical Factors:    Risk Reduction Factors:  Positive coping skills or problem solving skills  Total Time spent with patient: 20 minutes Principal Problem: <principal problem not specified> Diagnosis:   Patient Active Problem List   Diagnosis Date Noted  . Chronic renal failure [N18.9] 01/27/2017  . Hypertension [I10] 01/27/2017  . Lithium toxicity [T56.891A] 11/06/2016  . ARF (acute renal failure) ([place]) [N17.9] 11/05/2016  . Dehydration [E86.0] 11/05/2016  . Rhabdomyolysis [M62.82] 11/05/2016  . Schizoaffective disorder, bipolar type ([place]) [F25.0] 11/05/2016   Subjective Data: irritability  Continued Clinical Symptoms:  Alcohol Use Disorder Identification Test Final Score (AUDIT): 0 The "Alcohol Use Disorders Identification Test", Guidelines for Use in Primary Care, Second Edition.  World Pharmacologist [facility]). Score between 0-7:  no or low risk or alcohol related problems. Score between 8-15:  moderate risk of alcohol related problems. Score between 16-19:  high risk of alcohol related problems. Score 20 or above:  warrants further diagnostic evaluation for alcohol dependence and treatment.   CLINICAL FACTORS:   Schizophrenia:   Command hallucinatons   Musculoskeletal: Strength & Muscle Tone: within normal limits Gait & Station: normal Patient leans: N/A  Psychiatric Specialty Exam: Physical Exam  ROS  Physical Exam  Constitutional: He is oriented to person, place, and time. He appears well-developed and well-nourished.  HENT:  Head: Normocephalic and atraumatic.  Eyes: Pupils are equal, round, and reactive to light.  Neck: Normal range of motion.  Cardiovascular: Normal rate and regular rhythm.   Respiratory: Breath sounds normal.  Neurological: He is alert and oriented to person, place, and time.    ROS  Blood pressure (!) 150/98, pulse 80, temperature 97.8 F (36.6 C), temperature source Oral, resp. rate 18, height 5\' 9"  (1.753 m), weight 196 lb (88.9 kg).Body mass index is 28.94 kg/m.  General Appearance: Casual and Fairly Groomed  Eye Contact:  Good  Speech:  Normal Rate  Volume:  Normal  Mood:  okay  Affect:  Congruent and appropriate  Thought Process:  Coherent and Goal Directed  Orientation:  Full (Time, Place, and Person)  Thought Content:  Logical  Suicidal Thoughts:  No  Homicidal Thoughts:  No  Memory:  not tested  Judgement:  Impaired  Insight:  partial  Psychomotor Activity:  Normal  Akathisia:  No  Handed:    AIMS (if indicated):     Assets:  Communication Skills Desire for Improvement Housing Physical Health  ADL's:  Intact  Cognition:  WNL  Sleep:  Number of Hours: 4        COGNITIVE FEATURES THAT CONTRIBUTE TO RISK:  Loss of executive function and Thought constriction (tunnel vision)    SUICIDE RISK:   Minimal: No identifiable suicidal ideation.  Patients presenting with no risk factors but with morbid ruminations; may be classified as minimal risk based on the severity of the depressive symptoms  PLAN OF CARE:  #Psychosis Continue clozapine 300 mg daily. Continue perphenazine 4 mg daily. Will keep divaloproex on this regimen for now. Continue Klonopin 0.5 mg 3 times a day. Plan to discontinue lithium and keep it off his regimen given his worsening kidney functions as recommended in Dr Weber Cooks note. Thorazine 50 mg Q4H PRN  #HTN Continue metoprolol XL 25 mg daily  #GERD Continue ranitidine 150 mg twice a day  Discharge planning with ACT team next  week. I certify that inpatient services furnished can reasonably be expected to improve the patient's condition.   Ramond Dial, MD 01/28/2017, 1:04 PM

## 2017-01-28 NOTE — H&P (Addendum)
Psychiatric Admission Assessment Adult  Patient Identification: Nicholas Cole MRN:  [id number] Date of Evaluation:  01/28/2017 Chief Complaint:  Schizophrenia Principal Diagnosis: <principal problem not specified> Diagnosis:   Patient Active Problem List   Diagnosis Date Noted  . Chronic renal failure [N18.9] 01/27/2017  . Hypertension [I10] 01/27/2017  . Lithium toxicity [T56.891A] 11/06/2016  . ARF (acute renal failure) ([place]) [N17.9] 11/05/2016  . Dehydration [E86.0] 11/05/2016  . Rhabdomyolysis [M62.82] 11/05/2016  . Schizoaffective disorder, bipolar type [facility]) [F25.0] 11/05/2016   History of Present Illness: [name] is a 51 year old male with a history of schizoaffective disorder. Endorses that he lives in an apartment in [place] and was brought to the ED because he felt there was something wrong with his kidneys. He was seen by Dr. Weber Cole in the emergency room on 8/31. Excerpt from note by Dr Nicholas Cole dates 8/31   "The patient says that he came to the hospital because of blood in his urine. Judging from the triage notes it looks like he called EMS himself after becoming agitated when his act team worker came to visit him. Patient is very energetic with pressured speech and flight of ideas and tangential thinking making it very difficult to get specific information. He tells me that he has been feeling tired and run down recently. Also tells me that his mood has been not so good without any specifics. He admits that he sleeps poorly at night waking up multiple times in the night and unable to get back to sleep. He is vague about whether he has been able to eat and take care of his health very well. Patient freely admits that he has auditory hallucinations which have been a long-standing issue for him. I tried to get some clarity as to whether he was taking his medications and which ones. Patient apparently claims that he is still taking his "usual" medicines by which I believe he means  his Depakote and his clozapine  and Klonopin but that somebody tried to give him some "new" medicines and he threw those in the trash. I'm not exactly sure what that was. Possibly the Trilafon that I see on his medicine list. It's not clear to me whether he is currently prescribed lithium. Ever since his lithium toxicity and worsening renal function that has been on and off. We are still waiting for the lithium level. Patient denies to me any thoughts about hurting anyone but he ramps up very quickly and made some intimidating comments to some of the staff although he isn't acting out on any of it. Denies any substance abuse"  This morning he was irritable, spoke to me in detail now and says it would be difficult for him to get a ride over the weekend. Would like to stay on the [place] "they tried to give me Depakote, it gives me cramping pain on either side of my abdomen and I don't like it. I've been on the Clozaril for 9 years and I'm not going to let anyone take it away. I probably have to take it for the rest of my life but I don't mind. I don't like it when I'm given medications that mess with my body." Says he has not been taking divalproex and has been throwing those pills away. Denies any suicidal thoughts, passive SI all auditory or visual hallucinations. Denies any use of cannabis, tobacco, alcohol or any other illicit substances. Endorses that he has a a lot to look forward to and denies any depressive  cognitions.  Associated Signs/Symptoms: Depression Symptoms:  none (Hypo) Manic Symptoms:  irritability Anxiety Symptoms:  denies Psychotic Symptoms:  denies PTSD Symptoms: denies Total Time spent with patient: 1 hour  Past Psychiatric History: Long-standing mental health problems diagnosis schizoaffective disorder. Multiple hospitalizations including state hospitalizations. No history that I know of suicide attempts. Has been threatening and aggressive at times in the past. Patient is  on clozapine and Depakote. I am not sure whether lithium is still being used given his worsening renal function  Is the patient at risk to self? No.  Has the patient been a risk to self in the past 6 months? No.  Has the patient been a risk to self within the distant past? No.  Is the patient a risk to others? No.  Has the patient been a risk to others in the past 6 months? No.  Has the patient been a risk to others within the distant past? No.   Prior Inpatient Therapy:   Prior Outpatient Therapy:    Alcohol Screening: Patient refused Alcohol Screening Tool: Yes 1. How often do you have a drink containing alcohol?: Never 9. Have you or someone else been injured as a result of your drinking?: No 10. Has a relative or friend or a doctor or another health worker been concerned about your drinking or suggested you cut down?: No Alcohol Use Disorder Identification Test Final Score (AUDIT): 0 Brief Intervention: Patient declined brief intervention Substance Abuse History in the last 12 months:  No. Consequences of Substance Abuse: Negative Previous Psychotropic Medications: Yes  Psychological Evaluations: Yes  Past Medical History: Patient has chronic renal failure. Earlier this year he had a hospitalization for lithium toxicity and after recovering from that his creatinine has remained up around 2. Today it is creeping up towards 3. He also has high blood pressure.   Past Medical History:  Diagnosis Date  . Acute renal failure (ARF) ([place])   . Schizophrenia ([place])    History reviewed. No pertinent surgical history. Family History: History reviewed. No pertinent family history. Family Psychiatric  History:  Tobacco Screening: Have you used any form of tobacco in the last 30 days? (Cigarettes, Smokeless Tobacco, Cigars, and/or Pipes): Yes Tobacco use, Select all that apply: cigar use daily Are you interested in Tobacco Cessation Medications?: Yes, will notify MD for an order Counseled  patient on smoking cessation including recognizing danger situations, developing coping skills and basic information about quitting provided: Yes Social History:  History  Alcohol Use No     History  Drug Use No    Additional Social History:  Lives in an apartment in [place]. Endorses that his siblings are alive, but he does not keep in touch with them. Spends his day at the park at times. Says he gets his SSI check as weekly payments.  Denies any substance use currently.                         Allergies:  No Known Allergies Lab Results:  Results for orders placed or performed during the hospital encounter of 01/27/17 (from the past 48 hour(s))  Urine Drug Screen, Qualitative     Status: None   Collection Time: 01/27/17  4:05 PM  Result Value Ref Range   Tricyclic, Ur Screen NONE DETECTED NONE DETECTED   Amphetamines, Ur Screen NONE DETECTED NONE DETECTED   MDMA (Ecstasy)Ur Screen NONE DETECTED NONE DETECTED   Cocaine Metabolite,Ur [place] NONE DETECTED NONE DETECTED  Opiate, Ur Screen NONE DETECTED NONE DETECTED   Phencyclidine (PCP) Ur S NONE DETECTED NONE DETECTED   Cannabinoid 50 Ng, Ur  NONE DETECTED NONE DETECTED   Barbiturates, Ur Screen NONE DETECTED NONE DETECTED   Benzodiazepine, Ur Scrn NONE DETECTED NONE DETECTED   Methadone Scn, Ur NONE DETECTED NONE DETECTED    Comment: (NOTE) 696  Tricyclics, urine               Cutoff 1000 ng/mL 200  Amphetamines, urine             Cutoff 1000 ng/mL 300  MDMA (Ecstasy), urine           Cutoff 500 ng/mL 400  Cocaine Metabolite, urine       Cutoff 300 ng/mL 500  Opiate, urine                   Cutoff 300 ng/mL 600  Phencyclidine (PCP), urine      Cutoff 25 ng/mL 700  Cannabinoid, urine              Cutoff 50 ng/mL 800  Barbiturates, urine             Cutoff 200 ng/mL 900  Benzodiazepine, urine           Cutoff 200 ng/mL 1000 Methadone, urine                Cutoff 300 ng/mL 1100 1200 The urine drug screen provides  only a preliminary, unconfirmed 1300 analytical test result and should not be used for non-medical 1400 purposes. Clinical consideration and professional judgment should 1500 be applied to any positive drug screen result due to possible 1600 interfering substances. A more specific alternate chemical method 1700 must be used in order to obtain a confirmed analytical result.  1800 Gas chromato graphy / mass spectrometry (GC/MS) is the preferred 1900 confirmatory method.   Lithium level     Status: Abnormal   Collection Time: 01/27/17  4:25 PM  Result Value Ref Range   Lithium Lvl <0.06 (L) 0.60 - 1.20 mmol/L  Lipid panel     Status: Abnormal   Collection Time: 01/28/17  6:46 AM  Result Value Ref Range   Cholesterol 113 0 - 200 mg/dL   Triglycerides 203 (H) <150 mg/dL   HDL 20 (L) >40 mg/dL   Total CHOL/HDL Ratio 5.7 RATIO   VLDL 41 (H) 0 - 40 mg/dL   LDL Cholesterol 52 0 - 99 mg/dL    Comment:        Total Cholesterol/HDL:CHD Risk Coronary Heart Disease Risk Table                     Men   Women  1/2 Average Risk   3.4   3.3  Average Risk       5.0   4.4  2 X Average Risk   9.6   7.1  3 X Average Risk  23.4   11.0        Use the calculated Patient Ratio above and the CHD Risk Table to determine the patient's CHD Risk.        ATP III CLASSIFICATION (LDL):  <100     mg/dL   Optimal  100-129  mg/dL   Near or Above                    Optimal  130-159  mg/dL   Borderline  160-189  mg/dL  High  >190     mg/dL   Very High   TSH     Status: None   Collection Time: 01/28/17  6:46 AM  Result Value Ref Range   TSH 1.261 0.350 - 4.500 uIU/mL    Comment: Performed by a 3rd Generation assay with a functional sensitivity of <=0.01 uIU/mL.    Blood Alcohol level:  Lab Results  Component Value Date   ETH <5 01/27/2017   ETH <5 58/85/0277    Metabolic Disorder Labs:  No results found for: HGBA1C, MPG No results found for: PROLACTIN Lab Results  Component Value Date   CHOL  113 01/28/2017   TRIG 203 (H) 01/28/2017   HDL 20 (L) 01/28/2017   CHOLHDL 5.7 01/28/2017   VLDL 41 (H) 01/28/2017   LDLCALC 52 01/28/2017    Current Medications: Current Facility-Administered Medications  Medication Dose Route Frequency Provider Last Rate Last Dose  . acetaminophen (TYLENOL) tablet 650 mg  650 mg Oral Q6H PRN Clapacs, John T, MD      . alum & mag hydroxide-simeth (MAALOX/MYLANTA) 200-200-20 MG/5ML suspension 30 mL  30 mL Oral Q4H PRN Clapacs, John T, MD      . chlorproMAZINE (THORAZINE) tablet 50 mg  50 mg Oral Q4H PRN Clapacs, John T, MD      . clonazePAM [place]bye [place]) tablet 0.5 mg  0.5 mg Oral TID Clapacs, Madie Reno, MD   0.5 mg at 01/28/17 4128  . cloZAPine (CLOZARIL) tablet 300 mg  300 mg Oral QHS Clapacs, Madie Reno, MD   300 mg at 01/27/17 2236  . divalproex (DEPAKOTE) DR tablet 500 mg  500 mg Oral Q12H Clapacs, Madie Reno, MD   500 mg at 01/27/17 2235  . famotidine (PEPCID) tablet 20 mg  20 mg Oral BID Clapacs, Madie Reno, MD   20 mg at 01/28/17 7867  . hydrOXYzine (ATARAX/VISTARIL) tablet 50 mg  50 mg Oral TID PRN Clapacs, Madie Reno, MD      . magnesium hydroxide (MILK OF MAGNESIA) suspension 30 mL  30 mL Oral Daily PRN Clapacs, John T, MD      . metoprolol succinate (TOPROL-XL) 24 hr tablet 25 mg  25 mg Oral Daily Clapacs, Madie Reno, MD   25 mg at 01/28/17 6720   PTA Medications: Prescriptions Prior to Admission  Medication Sig Dispense Refill Last Dose  . clonazePAM (KLONOPIN) 0.5 MG tablet Take 1 tablet (0.5 mg total) by mouth 3 (three) times daily. 90 tablet 0 01/26/2017 at 1900  . cloZAPine (CLOZARIL) 100 MG tablet Take 3 tablets (300 mg total) by mouth daily. 90 tablet 0 01/26/2017 at 1900  . divalproex (DEPAKOTE) 500 MG DR tablet Take 1 tablet (500 mg total) by mouth every 12 (twelve) hours. (Patient not taking: Reported on 01/27/2017) 60 tablet 0 Not Taking at Unknown time  . metoprolol succinate (TOPROL-XL) 25 MG 24 hr tablet Take 1 tablet (25 mg total) by mouth daily. 30 tablet  0 Past Month at Unknown time  . perphenazine (TRILAFON) 4 MG tablet Take 1 tablet (4 mg total) by mouth at bedtime. (Patient not taking: Reported on 01/27/2017) 30 tablet 0 Not Taking at Unknown time  . ranitidine (ZANTAC) 150 MG tablet Take 150 mg by mouth 2 (two) times daily.   Not Taking at Unknown time    Musculoskeletal: Strength & Muscle Tone: within normal limits Gait & Station: normal Patient leans: N/A  Psychiatric Specialty Exam: Physical Exam  Constitutional: He is oriented to person, place,  and time. He appears well-developed and well-nourished.  HENT:  Head: Normocephalic and atraumatic.  Eyes: Pupils are equal, round, and reactive to light.  Neck: Normal range of motion.  Cardiovascular: Normal rate and regular rhythm.   Respiratory: Breath sounds normal.  Neurological: He is alert and oriented to person, place, and time.    ROS  Blood pressure (!) 150/98, pulse 80, temperature 97.8 F (36.6 C), temperature source Oral, resp. rate 18, height 5\' 9"  (1.753 m), weight 196 lb (88.9 kg).Body mass index is 28.94 kg/m.  General Appearance: Casual and Fairly Groomed  Eye Contact:  Good  Speech:  Normal Rate  Volume:  Normal  Mood:  okay  Affect:  Congruent and appropriate  Thought Process:  Coherent and Goal Directed  Orientation:  Full (Time, Place, and Person)  Thought Content:  Logical  Suicidal Thoughts:  No  Homicidal Thoughts:  No  Memory:  not tested  Judgement:  Impaired  Insight:  partial  Psychomotor Activity:  Normal  Akathisia:  No  Handed:    AIMS (if indicated):     Assets:  Communication Skills Desire for Improvement Housing Physical Health  ADL's:  Intact  Cognition:  WNL  Sleep:  Number of Hours: 4    Treatment Plan Summary: 51 year old gentleman with a history of schizoaffective disorder and multiple hospitalizations, or sending to the ED with subjective complaints of hematuria. He appeared irritable and hostile in the ED, which was  continued after admission to the unit. Speaking to him in the afternoon, he endorses that he has been keeping well and would like only the clozapine and clonazepam. Does not display any affective symptoms currently and endorses that he does not want to take valproic acid. In this essentially not clear what the severity of psychopathology is at this point. For now we will continue to observe, administer him clozapine and perphenazine. Depending on how he reconstitutes over the next few days, we will titrate doses of his other psychotropics. Daily contact with patient to assess and evaluate symptoms and progress in treatment, Medication management and Plan     #Psychosis Continue clozapine 300 mg daily. Continue perphenazine 4 mg daily. Will keep divaloproex on this regimen for now. Continue Klonopin 0.5 mg 3 times a day. Plan to discontinue lithium and keep it off his regimen given his worsening kidney functions as recommended in Dr Nicholas Cole note. Thorazine 50 mg Q4H PRN  #HTN Continue metoprolol XL 25 mg daily  #GERD Continue ranitidine 150 mg twice a day  Discharge planning with ACT team next week. Observation Level/Precautions:  Continuous Observation  Laboratory:  NA  Psychotherapy:    Medications:    Consultations:    Discharge Concerns:    Estimated LOS:  Other:     Physician Treatment Plan for Primary Diagnosis: <principal problem not specified> Long Term Goal(s): Improvement in symptoms so as ready for discharge  Short Term Goals: Ability to identify changes in lifestyle to reduce recurrence of condition will improve and Ability to verbalize feelings will improve  Physician Treatment Plan for Secondary Diagnosis: Active Problems:   Schizoaffective disorder, bipolar type ([place])  Long Term Goal(s): Improvement in symptoms so as ready for discharge  Short Term Goals: Ability to identify changes in lifestyle to reduce recurrence of condition will improve  I certify that  inpatient services furnished can reasonably be expected to improve the patient's condition.    Ramond Dial, MD 9/1/201812:23 PM

## 2017-01-28 NOTE — Tx Team (Signed)
Initial Treatment Plan 01/28/2017 12:14 AM Nicholas Cole FEX:[id number]    PATIENT STRESSORS: Health problems Medication change or noncompliance   PATIENT STRENGTHS: Capable of independent living Communication skills   PATIENT IDENTIFIED PROBLEMS:                      DISCHARGE CRITERIA:  Improved stabilization in mood, thinking, and/or behavior Medical problems require only outpatient monitoring  PRELIMINARY DISCHARGE PLAN: Outpatient therapy Return to previous living arrangement  PATIENT/FAMILY INVOLVEMENT: This treatment plan has been presented to and reviewed with the patient, Nicholas Cole.  The patient has  been given the opportunity to ask questions and make suggestions.  Carl Best, RN 01/28/2017, 12:14 AM

## 2017-01-28 NOTE — B[facility] Group Notes (Signed)
[facility] Group Notes:  (Nursing/MHT/Case Management/Adjunct)  Date:  01/28/2017  Time:  9:42 PM  Type of Therapy:  Group Therapy  Participation Level:  Active  Participation Quality:  Sharing  Affect:  Anxious  Cognitive:  Alert  Insight:  Good  Engagement in Group:  Off Topic  Modes of Intervention:  Support  Summary of Progress/Problems:  Nehemiah Settle 01/28/2017, 9:42 PM

## 2017-01-28 NOTE — Progress Notes (Signed)
Patient was irritable this morning and refused his Depakote stating he does not take it. Later on patient refused clonopin because he says he only take it twice a day not three times and we are trying to dope him up. Patient later on came out of his room and was more pleasant and observed sitting in dayroom interacting with peers. Patient is alert and oriented x 4, breathing unlabored, and extremities x 4 within normal limits.Patient did not display any disruptive behavior.Patient continues to be monitored on 15 minute safety checks. Will continue to monitor patient and notify MD of any changes.

## 2017-01-29 NOTE — [facility] Group Notes (Signed)
[facility] Group Notes:  (Nursing/MHT/Case Management/Adjunct)  Date:  01/29/2017  Time:  9:46 PM  Type of Therapy:  Evening Wrap-up Group  Participation Level:  Active  Participation Quality:  Intrusive  Affect:  Not Congruent  Cognitive:  Disorganized  Insight:  None  Engagement in Group:  Off Topic  Modes of Intervention:  Discussion  Summary of Progress/Problems:  Nicholas Cole 01/29/2017, 9:46 PM

## 2017-01-29 NOTE — Plan of Care (Signed)
Problem: Safety: Goal: Periods of time without injury will increase Outcome: Progressing Pt will be free from injuries the entire shift.

## 2017-01-29 NOTE — BHH Counselor (Signed)
Adult Comprehensive Assessment  Patient ID: Nicholas Cole, male   DOB: Oct 30, 1965, 51 y.o.   MRN: [id number]  Information Source: Information source: Patient  Current Stressors:  Educational / Learning stressors: Did not complete high school.  Employment / Job issues: On SSDI Family Relationships: None reported  Financial / Lack of resources (include bankruptcy): Limited income.  Housing / Lack of housing: None reported  Physical health (include injuries & life threatening diseases): Renal failure  Social relationships: None reported  Substance abuse: None reported  Bereavement / Loss: None reported   Living/Environment/Situation:  Living Arrangements: Alone Living conditions (as described by patient or guardian): Pt lives alone in an apartment  How long has patient lived in current situation?: 7 months  What is atmosphere in current home: Comfortable  Family History:  Marital status: Single Are you sexually active?: Yes What is your sexual orientation?: Heterosexual Has your sexual activity been affected by drugs, alcohol, medication, or emotional stress?: None reported  Does patient have children?: Yes How many children?: 2 How is patient's relationship with their children?: Son and daughter, distant relationship   Childhood History:  By whom was/is the patient raised?: Both parents Description of patient's relationship with caregiver when they were a child: Good relationship with parents  Patient's description of current relationship with people who raised him/her: Both parents have passed away.  How were you disciplined when you got in trouble as a child/adolescent?: None reported  Does patient have siblings?: Yes Number of Siblings: 4 Description of patient's current relationship with siblings: Distant relationship with siblings  Did patient suffer any verbal/emotional/physical/sexual abuse as a child?: No Did patient suffer from severe childhood neglect?: No Has  patient ever been sexually abused/assaulted/raped as an adolescent or adult?: No Was the patient ever a victim of a crime or a disaster?: No Witnessed domestic violence?: No Has patient been effected by domestic violence as an adult?: No  Education:  Highest grade of school patient has completed: 8th  Currently a student?: No Learning disability?: No  Employment/Work Situation:   Employment situation: On disability Why is patient on disability: Mental health  How long has patient been on disability: since 4 Patient's job has been impacted by current illness: No What is the longest time patient has a held a job?: Unsure  Where was the patient employed at that time?: [employer]  Has patient ever been in the [employer]?: No  Financial Resources:   Museum/gallery curator resources: Teacher, early years/pre, [employer], Medicare Does patient have a Programmer, applications or guardian?: Yes Name of representative payee or guardian: Payee- "some lady on the [place]."   Alcohol/Substance Abuse:   What has been your use of drugs/alcohol within the last 12 months?: Denies use  If attempted suicide, did drugs/alcohol play a role in this?: No Alcohol/Substance Abuse Treatment Hx: Denies past history Has alcohol/substance abuse ever caused legal problems?: No  Social Support System:   Pensions consultant Support System: Good Describe Community Support System: family, PSI Type of faith/religion: Darrick Meigs   Leisure/Recreation:   Leisure and Hobbies: walking, jogging, going to the park   Strengths/Needs:   What things does the patient do well?: "nothing"  In what areas does patient struggle / problems for patient: Pt was focused on medical issues.   Discharge Plan:   Does patient have access to transportation?: Yes Will patient be returning to same living situation after discharge?: Yes Currently receiving community mental health services: Yes (From Whom) ([facility] ) Does patient have  financial barriers  related to discharge medications?: No  Summary/Recommendations:   Patient is a 51 year old  admitted with a diagnosis of Schizoaffective Disorder. Patient presented to the hospital with psychosis and mood instability. Patient was unable to identify primary triggers for admission. Patient will benefit from crisis stabilization, medication evaluation, group therapy and psycho education in addition to case management for discharge. At discharge, it is recommended that patient remain compliant with established discharge plan and continued treatment.   [place]  MSW, LCSW . 01/29/2017

## 2017-01-29 NOTE — Progress Notes (Addendum)
[facility] MD Progress Note  01/29/2017 12:23 PM Nicholas Cole  MRN:  [id number] Subjective:  He endorses that he is doing well. He is interactive with other patients on the unit. Denies any auditory or visual hallucinations. Says he had a good night's rest and slept fine.  Gets occasionally irritable with nursing staff, situations being completely unprovoked. Reported that he does not want to take the Depakote.  Care Everywhere documents that he had hyperammonemia with Depakote. Lithium was also tried but he developed lithium toxicity with renal failure. Current home medications include perphenazine, Klonopin and clozapine. There is some degree of discrepancy in the dosing of Klonopin which varies from 0.5 mg daily to 0.5 mg 3 times daily. However he would like about consolidated at night and so his nighttime dosing is currently 1.5 mg at bedtime.  Was also worked up for myeloma in the past, considering he had elevated parathyroid hormone levels and abnormal immunoglobulin levels. Serum protein electrophoresis was unremarkable at [facility] nephrology. Principal Problem: <principal problem not specified> Diagnosis:   Patient Active Problem List   Diagnosis Date Noted  . Chronic renal failure [N18.9] 01/27/2017  . Hypertension [I10] 01/27/2017  . Lithium toxicity [T56.891A] 11/06/2016  . ARF (acute renal failure) ([place]) [N17.9] 11/05/2016  . Dehydration [E86.0] 11/05/2016  . Rhabdomyolysis [M62.82] 11/05/2016  . Schizoaffective disorder, bipolar type ([place]) [F25.0] 11/05/2016   Total Time spent with patient: 30 minutes  Past Psychiatric History: Long-standing mental health problems diagnosis schizoaffective disorder. Multiple hospitalizations including state hospitalizations. No history that I know of suicide attempts. Has been threatening and aggressive at times in the past. Patient is on clozapine and Depakote. I am not sure whether lithium is still being used given his worsening renal function.  Past  Medical History:  Past Medical History:  Diagnosis Date  . Acute renal failure (ARF) ([place])   . Schizophrenia ([place])    History reviewed. No pertinent surgical history. Family History: History reviewed. No pertinent family history. Family Psychiatric  History:  Social History:  History  Alcohol Use No     History  Drug Use No    Social History   Social History  . Marital status: Single    Spouse name: N/A  . Number of children: N/A  . Years of education: N/A   Social History Main Topics  . Smoking status: Former Research scientist (life sciences)  . Smokeless tobacco: Never Used  . Alcohol use No  . Drug use: No  . Sexual activity: Not Asked   Other Topics Concern  . None   Social History Narrative  . None   Additional Social History:    Lives in an apartment in [place]. Endorses that his siblings are alive, but he does not keep in touch with them. Spends his day at the park at times. Says he gets his SSI check as weekly payments.  Denies any substance use currently.       Sleep: Good  Appetite:  Good  Current Medications: Current Facility-Administered Medications  Medication Dose Route Frequency Provider Last Rate Last Dose  . acetaminophen (TYLENOL) tablet 650 mg  650 mg Oral Q6H PRN Clapacs, John T, MD      . alum & mag hydroxide-simeth (MAALOX/MYLANTA) 200-200-20 MG/5ML suspension 30 mL  30 mL Oral Q4H PRN Clapacs, John T, MD      . chlorproMAZINE (THORAZINE) tablet 50 mg  50 mg Oral Q4H PRN Clapacs, John T, MD      . clonazePAM (KLONOPIN) tablet 1.5 mg  1.5  mg Oral QHS Ramond Dial, MD   1.5 mg at 01/28/17 2149  . cloZAPine (CLOZARIL) tablet 150 mg  150 mg Oral BID Ramond Dial, MD   150 mg at 01/29/17 0833  . divalproex (DEPAKOTE) DR tablet 500 mg  500 mg Oral Q12H Clapacs, Madie Reno, MD   500 mg at 01/27/17 2235  . famotidine (PEPCID) tablet 20 mg  20 mg Oral BID Clapacs, Madie Reno, MD   20 mg at 01/29/17 4403  . hydrOXYzine (ATARAX/VISTARIL) tablet 50 mg  50 mg Oral TID  PRN Clapacs, Madie Reno, MD      . magnesium hydroxide (MILK OF MAGNESIA) suspension 30 mL  30 mL Oral Daily PRN Clapacs, John T, MD      . metoprolol succinate (TOPROL-XL) 24 hr tablet 25 mg  25 mg Oral Daily Clapacs, Madie Reno, MD   25 mg at 01/29/17 4742    Lab Results:  Results for orders placed or performed during the hospital encounter of 01/27/17 (from the past 48 hour(s))  Urine Drug Screen, Qualitative     Status: None   Collection Time: 01/27/17  4:05 PM  Result Value Ref Range   Tricyclic, Ur Screen NONE DETECTED NONE DETECTED   Amphetamines, Ur Screen NONE DETECTED NONE DETECTED   MDMA (Ecstasy)Ur Screen NONE DETECTED NONE DETECTED   Cocaine Metabolite,Ur [place] NONE DETECTED NONE DETECTED   Opiate, Ur Screen NONE DETECTED NONE DETECTED   Phencyclidine (PCP) Ur S NONE DETECTED NONE DETECTED   Cannabinoid 50 Ng, Ur [place] NONE DETECTED NONE DETECTED   Barbiturates, Ur Screen NONE DETECTED NONE DETECTED   Benzodiazepine, Ur Scrn NONE DETECTED NONE DETECTED   Methadone Scn, Ur NONE DETECTED NONE DETECTED    Comment: (NOTE) 595  Tricyclics, urine               Cutoff 1000 ng/mL 200  Amphetamines, urine             Cutoff 1000 ng/mL 300  MDMA (Ecstasy), urine           Cutoff 500 ng/mL 400  Cocaine Metabolite, urine       Cutoff 300 ng/mL 500  Opiate, urine                   Cutoff 300 ng/mL 600  Phencyclidine (PCP), urine      Cutoff 25 ng/mL 700  Cannabinoid, urine              Cutoff 50 ng/mL 800  Barbiturates, urine             Cutoff 200 ng/mL 900  Benzodiazepine, urine           Cutoff 200 ng/mL 1000 Methadone, urine                Cutoff 300 ng/mL 1100 1200 The urine drug screen provides only a preliminary, unconfirmed 1300 analytical test result and should not be used for non-medical 1400 purposes. Clinical consideration and professional judgment should 1500 be applied to any positive drug screen result due to possible 1600 interfering substances. A more specific alternate  chemical method 1700 must be used in order to obtain a confirmed analytical result.  1800 Gas chromato graphy / mass spectrometry (GC/MS) is the preferred 1900 confirmatory method.   Lithium level     Status: Abnormal   Collection Time: 01/27/17  4:25 PM  Result Value Ref Range   Lithium Lvl <0.06 (L) 0.60 - 1.20 mmol/L  Hemoglobin  A1c     Status: Abnormal   Collection Time: 01/28/17  6:46 AM  Result Value Ref Range   Hgb A1c MFr Bld 4.4 (L) 4.8 - 5.6 %    Comment: (NOTE) Pre diabetes:          5.7%-6.4% Diabetes:              >6.4% Glycemic control for   <7.0% adults with diabetes    Mean Plasma Glucose 79.58 mg/dL    Comment: Performed at [facility] [address] [place], [place] [address]  Lipid panel     Status: Abnormal   Collection Time: 01/28/17  6:46 AM  Result Value Ref Range   Cholesterol 113 0 - 200 mg/dL   Triglycerides 203 (H) <150 mg/dL   HDL 20 (L) >40 mg/dL   Total CHOL/HDL Ratio 5.7 RATIO   VLDL 41 (H) 0 - 40 mg/dL   LDL Cholesterol 52 0 - 99 mg/dL    Comment:        Total Cholesterol/HDL:CHD Risk Coronary Heart Disease Risk Table                     Men   Women  1/2 Average Risk   3.4   3.3  Average Risk       5.0   4.4  2 X Average Risk   9.6   7.1  3 X Average Risk  23.4   11.0        Use the calculated Patient Ratio above and the CHD Risk Table to determine the patient's CHD Risk.        ATP III CLASSIFICATION (LDL):  <100     mg/dL   Optimal  100-129  mg/dL   Near or Above                    Optimal  130-159  mg/dL   Borderline  160-189  mg/dL   High  >190     mg/dL   Very High   TSH     Status: None   Collection Time: 01/28/17  6:46 AM  Result Value Ref Range   TSH 1.261 0.350 - 4.500 uIU/mL    Comment: Performed by a 3rd Generation assay with a functional sensitivity of <=0.01 uIU/mL.    Blood Alcohol level:  Lab Results  Component Value Date   ETH <5 01/27/2017   ETH <5 01/77/9390    Metabolic Disorder Labs: Lab  Results  Component Value Date   HGBA1C 4.4 (L) 01/28/2017   MPG 79.58 01/28/2017   No results found for: PROLACTIN Lab Results  Component Value Date   CHOL 113 01/28/2017   TRIG 203 (H) 01/28/2017   HDL 20 (L) 01/28/2017   CHOLHDL 5.7 01/28/2017   VLDL 41 (H) 01/28/2017   LDLCALC 52 01/28/2017    Physical Findings: AIMS:  , ,  ,  ,    CIWA:    COWS:     Musculoskeletal: Strength & Muscle Tone: within normal limits Gait & Station: normal Patient leans: N/A  Psychiatric Specialty Exam: Physical Exam  ROS  Blood pressure (!) 142/97, pulse 68, temperature 97.8 F (36.6 C), temperature source Oral, resp. rate 18, height 5\' 9"  (1.753 m), weight 196 lb (88.9 kg), SpO2 98 %.Body mass index is 28.94 kg/m.  General Appearance: Fairly Groomed  Eye Contact:  Good  Speech:  Clear and Coherent  Volume:  Normal  Mood:  occasionally irritable  Affect:  Full Range  Thought Process:  Coherent  Orientation:  Full (Time, Place, and Person)  Thought Content:  Negative  Suicidal Thoughts:  No  Homicidal Thoughts:  No  Memory:  Negative  Judgement:  Impaired  Insight:  Fair  Psychomotor Activity:  Normal  Handed:    AIMS (if indicated):     Assets:  Communication Skills Housing Social Support  ADL's:  Intact  Cognition:  WNL  Sleep:  Number of Hours: 7.3   Treatment Plan Summary: 51 year old gentleman with a history of schizoaffective disorder and multiple hospitalizations, or sending to the ED with subjective complaints of hematuria. He appeared irritable and hostile in the ED, which was continued after admission to the unit. Speaking to him in the afternoon, he endorses that he has been keeping well and would like only the clozapine and clonazepam. Does not display any affective symptoms currently and endorses that he does not want to take valproic acid. In this essentially not clear what the severity of psychopathology is at this point. For now we will continue to observe,  administer him clozapine and perphenazine. Depending on how he reconstitutes over the next few days, we will titrate doses of his other psychotropics. We'll keep Depakote off his regimen, even previous instance of hyperammonemia with Depakote. Lithium caused toxicity and renal failure and hence needs to be kept off his regimen. Daily contact with patient to assess and evaluate symptoms and progress in treatment, Medication management and Plan     #Psychosis Continue clozapine 300 mg daily. Continue perphenazine 4 mg daily. D/C divaloproex for now. Continue Klonopin 0.5 mg 3 times a day. Thorazine 50 mg Q4H PRN  #HTN Continue metoprolol XL 25 mg daily  #GERD Continue ranitidine 150 mg twice a day  Discharge planning with ACT team next week. Observation Level/Precautions:  Continuous Observation  Laboratory:  NA  Psychotherapy:    Medications:    Consultations:    Discharge Concerns:    Estimated LOS:  Other:       Treatment Plan Summary: Daily contact with patient to assess and evaluate symptoms and progress in treatment and Medication management  Ramond Dial, MD 01/29/2017, 12:23 PM

## 2017-01-29 NOTE — Plan of Care (Signed)
Problem: Coping: Goal: Ability to verbalize frustrations and anger appropriately will improve Outcome: Progressing Pt will verbalize frustrations and anger appropriately for the next 6 shifts.

## 2017-01-29 NOTE — Progress Notes (Signed)
Patient was talking loud in the day room about World war and 9/11.Patient was easily redirectable.Patient is pacing in the hallway with an irritable mood.Thorazine offered as PRN.Patient refused.Compliant with all due meds.No interactions with peers.Did not attend groups.Denies suicidal or homicidal ideations and AV hallucinations.Support & encouragement given.

## 2017-01-29 NOTE — [facility] Group Notes (Signed)
[place] LCSW Group Therapy  01/29/2017 11:07 AM  Type of Therapy:  Group Therapy  Participation Level:  Did Not Attend  Modes of Intervention:  Activity, Discussion, Education, Socialization and Support  Summary of Progress/Problems: Balance in life: Patients will discuss the concept of balance and how it looks and feels to be unbalanced. Pt will identify areas in their life that is unbalanced and ways to become more balanced.   [place] MSW, LCSW  01/29/2017, 11:07 AM

## 2017-01-29 NOTE — Plan of Care (Signed)
Problem: Education: Goal: Will be free of psychotic symptoms Outcome: Not Progressing Patient remains unstable mentally at this time. Isolative to room.  Problem: Coping: Goal: Ability to cope will improve Outcome: Not Progressing Patient remains isolative to his room. Goal: Ability to verbalize feelings will improve Outcome: Not Progressing Patient does not communicate his feelings appropriately with staff.  Problem: Activity: Goal: Interest or engagement in activities will improve Outcome: Not Progressing Patient remains isolative to room,  comes out for meals and medication.  Problem: Coping: Goal: Ability to verbalize frustrations and anger appropriately will improve Outcome: Not Progressing Patient becomes easily frustrated and angry with staff.  Problem: Safety: Goal: Periods of time without injury will increase Outcome: Progressing Patient remains free from injury at this time.

## 2017-01-29 NOTE — Progress Notes (Signed)
Pt is alert and oriented x 3. Pt was very argumentative and refused to take his Depakote. Pt states he is not taking any new medications, he says this medication makes me dizzy and I had a fall just recently because of this med. Pt is isolative and continues to be paranoid. He has a flight of ideas and speaks loudly. Pt did take his Klonopin. Pt denies SI, HI or A/V hallucinations. Pt did contract to safety. Pt did get a snack at snack time. 15 min safety checks continues

## 2017-01-29 NOTE — Progress Notes (Signed)
Pt is very aggressive and talking very loud. 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 Pt began talking about wars and naming other countries that the [place] is afraid to fight. Pt is having flight of ideas. Pt continues to speak loud, pt has been redirected several times to no avail. Pt refuses to go in room and go to bed, says he isn't sleepy. Pt was complaint with night time meds. Pt continues to state he is not taking any new meds, only the ones he's been taking for 15 years. Pt denies SI, HI or A/V hallucinations but pt can be seen talking to self, no auditory hallucinations telling pt to harm self. Pt did contract to safety. 15 min safety checks continues.

## 2017-01-30 ENCOUNTER — Encounter: Payer: Self-pay | Admitting: Psychiatry

## 2017-01-30 DIAGNOSIS — K219 Gastro-esophageal reflux disease without esophagitis: Secondary | ICD-10-CM | POA: Diagnosis present

## 2017-01-30 MED ORDER — ZIPRASIDONE MESYLATE 20 MG IM SOLR
10.0000 mg | Freq: Once | INTRAMUSCULAR | Status: AC
  Administered 2017-01-30: 10 mg via INTRAMUSCULAR
  Filled 2017-01-30: qty 20

## 2017-01-30 MED ORDER — DIPHENHYDRAMINE HCL 50 MG/ML IJ SOLN
50.0000 mg | Freq: Four times a day (QID) | INTRAMUSCULAR | Status: DC | PRN
  Administered 2017-01-31: 50 mg via INTRAMUSCULAR
  Filled 2017-01-30 (×3): qty 1

## 2017-01-30 MED ORDER — CLONAZEPAM 0.5 MG PO TBDP: 1.0000 mg | ORAL_TABLET | Freq: Once | ORAL | Status: DC

## 2017-01-30 MED ORDER — LORAZEPAM 2 MG/ML IJ SOLN
2.0000 mg | Freq: Once | INTRAMUSCULAR | Status: AC
  Administered 2017-01-30: 2 mg via INTRAMUSCULAR
  Filled 2017-01-30: qty 1

## 2017-01-30 MED ORDER — ZIPRASIDONE MESYLATE 20 MG IM SOLR
10.0000 mg | Freq: Four times a day (QID) | INTRAMUSCULAR | Status: DC | PRN
  Administered 2017-01-30 – 2017-02-01 (×4): 10 mg via INTRAMUSCULAR
  Filled 2017-01-30 (×4): qty 20

## 2017-01-30 MED ORDER — LORAZEPAM 2 MG/ML IJ SOLN: 2.0000 mg | Freq: Four times a day (QID) | INTRAMUSCULAR | Status: DC | PRN

## 2017-01-30 MED ORDER — ZIPRASIDONE MESYLATE 20 MG IM SOLR: 20.0000 mg | Freq: Once | INTRAMUSCULAR | Status: DC

## 2017-01-30 MED ORDER — PERPHENAZINE 4 MG PO TABS
4.0000 mg | ORAL_TABLET | Freq: Two times a day (BID) | ORAL | Status: DC
  Administered 2017-01-31 – 2017-02-01 (×3): 4 mg via ORAL
  Filled 2017-01-30 (×4): qty 1

## 2017-01-30 MED ORDER — CLONAZEPAM 1 MG PO TABS
1.0000 mg | ORAL_TABLET | Freq: Once | ORAL | Status: AC
  Administered 2017-01-30: 1 mg via ORAL

## 2017-01-30 MED ORDER — CLONAZEPAM 1 MG PO TABS
2.0000 mg | ORAL_TABLET | Freq: Once | ORAL | Status: AC
  Administered 2017-01-30: 2 mg via ORAL
  Filled 2017-01-30: qty 2

## 2017-01-30 MED ORDER — LORAZEPAM 2 MG/ML IJ SOLN
2.0000 mg | Freq: Four times a day (QID) | INTRAMUSCULAR | Status: DC | PRN
  Administered 2017-01-30 – 2017-01-31 (×2): 2 mg via INTRAMUSCULAR
  Filled 2017-01-30 (×3): qty 1

## 2017-01-30 NOTE — Progress Notes (Signed)
Patient ID: Nicholas Cole, male   DOB: 06/23/1965, 51 y.o.   MRN: [id number] The patient was agitated and aggressive this morning starting at 7:15 AM. Used a lot of profanity with male staff and was verbally aggressive towards provider too. Kept pacing around the unit with occasional instances of being verbally loud and aggressive. Agreed to take 2 mg of clonazepam initially and was calm for some time, following which he restarted verbal aggression. Security was called on the unit and patient was given IM injection of Geodon 10 mg, lorazepam 2 mg and Benadryl 25 mg.

## 2017-01-30 NOTE — [facility] Group Notes (Signed)
[place] LCSW Group Therapy Note  Date/Time: 01/30/2017, 9:30am  Type of Therapy and Topic:  Group Therapy:  Overcoming Obstacles  Participation Level:  Unable to attend, Pt very agitated requiring forced IM medications.  Dossie Arbour, LCSW

## 2017-01-30 NOTE — Progress Notes (Signed)
Patient ID: Nicholas Cole, male   DOB: 1965/12/05, 51 y.o.   MRN: [id number] Patient continues to be agitated and aggressive. Unable to redirect patient, using profanity with male staff. Refusing to take any any oral medications which are PRN. Will administer one more dose of IM injection Geodon 10 mg with 2 mg of lorazepam IM and 50 mg of Benadryl IM.  Also advise Geodon 10 mg IM injection with lorazepam 2 mg IM every 6 hours in case of severe agitation.

## 2017-01-30 NOTE — Progress Notes (Signed)
D: Pt continues to be verbally aggressive and threatening towards peers and staff this afternoon unprovoked. Called a male peer "you fucking fagot, I'll fuck your ass up". Proceed to be verbally abusive towards male staff "bitch just shut the fuck up" when redirection was attempted. Speech is pressured, loud and his mood remains labile thus far. A: New order received for IM medications (Ativan, Geodon and Benadryl see emar) due to escalating behavior and refusal of PO medications. Continued support and encouragement offered. Q 15 minutes safety checks maintained.  R: Observed pacing with security staff on reassessment, continue to need redirections and is intermittently cooperative at time. Mood remains labile.

## 2017-01-30 NOTE — Progress Notes (Addendum)
[facility] MD Progress Note  01/30/2017 12:43 PM Jud Fanguy  MRN:  [id number] Subjective:   01/29/17 - He endorses that he is doing well. He is interactive with other patients on the unit. Denies any auditory or visual hallucinations. Says he had a good night's rest and slept fine.  Gets occasionally irritable with nursing staff, situations being completely unprovoked. Reported that he does not want to take the Depakote.  Care Everywhere documents that he had hyperammonemia with Depakote. Lithium was also tried but he developed lithium toxicity with renal failure. Current home medications include perphenazine, Klonopin and clozapine. There is some degree of discrepancy in the dosing of Klonopin which varies from 0.5 mg daily to 0.5 mg 3 times daily. However he would like about consolidated at night and so his nighttime dosing is currently 1.5 mg at bedtime.  Was also worked up for myeloma in the past, considering he had elevated parathyroid hormone levels and abnormal immunoglobulin levels. Serum protein electrophoresis was unremarkable at [facility] nephrology.  01/30/17 - patient was agitated this morning. Kept walking around the unit and verbalizing profanity to male staff. There was certain instances wherein patient was verbally loud and had aggressive postures. Agreed to take either clozapine on clonazepam as a PRN medication. Did not calm down with 2 mg of oral Klonopin. Continued to be verbally loud and engage in altercations with male nursing staff. Was also verbally loud with security officers and assumed aggressive postures. Needed to be given 10 mg of Geodon, with 2 mg of lorazepam and 25 mg of Benadryl as an IM injection. Security was called on the unit for the same. Following the injection patient was confined to his room for some time and has not been aggressive. Principal Problem: <principal problem not specified> Diagnosis:   Patient Active Problem List   Diagnosis Date Noted  . Chronic renal  failure [N18.9] 01/27/2017  . Hypertension [I10] 01/27/2017  . Lithium toxicity [T56.891A] 11/06/2016  . ARF (acute renal failure) ([place]) [N17.9] 11/05/2016  . Dehydration [E86.0] 11/05/2016  . Rhabdomyolysis [M62.82] 11/05/2016  . Schizoaffective disorder, bipolar type ([place]) [F25.0] 11/05/2016   Total Time spent with patient: 50 minutes  Past Psychiatric History: Long-standing mental health problems diagnosis schizoaffective disorder. Multiple hospitalizations including state hospitalizations. No history that I know of suicide attempts. Has been threatening and aggressive at times in the past. Patient is on clozapine and Depakote. I am not sure whether lithium is still being used given his worsening renal function.  Past Medical History:  Past Medical History:  Diagnosis Date  . Acute renal failure (ARF) ([place])   . Schizophrenia ([place])    History reviewed. No pertinent surgical history. Family History: History reviewed. No pertinent family history. Family Psychiatric  History:  Social History:  History  Alcohol Use No     History  Drug Use No    Social History   Social History  . Marital status: Single    Spouse name: N/A  . Number of children: N/A  . Years of education: N/A   Social History Main Topics  . Smoking status: Former Research scientist (life sciences)  . Smokeless tobacco: Never Used  . Alcohol use No  . Drug use: No  . Sexual activity: Not Asked   Other Topics Concern  . None   Social History Narrative  . None   Additional Social History:    Lives in an apartment in [place]. Endorses that his siblings are alive, but he does not keep in touch with them.  Spends his day at the park at times. Says he gets his SSI check as weekly payments.  Denies any substance use currently.       Sleep: Good  Appetite:  Good  Current Medications: Current Facility-Administered Medications  Medication Dose Route Frequency Provider Last Rate Last Dose  . acetaminophen (TYLENOL) tablet 650 mg   650 mg Oral Q6H PRN Clapacs, John T, MD      . alum & mag hydroxide-simeth (MAALOX/MYLANTA) 200-200-20 MG/5ML suspension 30 mL  30 mL Oral Q4H PRN Clapacs, John T, MD      . chlorproMAZINE (THORAZINE) tablet 50 mg  50 mg Oral Q4H PRN Clapacs, John T, MD      . clonazePAM Bobbye [place]) tablet 1.5 mg  1.5 mg Oral QHS Ramond Dial, MD   1.5 mg at 01/29/17 2129  . cloZAPine (CLOZARIL) tablet 150 mg  150 mg Oral BID Ramond Dial, MD   150 mg at 01/30/17 0800  . famotidine (PEPCID) tablet 20 mg  20 mg Oral BID Clapacs, John T, MD   20 mg at 01/30/17 0800  . hydrOXYzine (ATARAX/VISTARIL) tablet 50 mg  50 mg Oral TID PRN Clapacs, John T, MD      . magnesium hydroxide (MILK OF MAGNESIA) suspension 30 mL  30 mL Oral Daily PRN Clapacs, John T, MD      . metoprolol succinate (TOPROL-XL) 24 hr tablet 25 mg  25 mg Oral Daily Clapacs, John T, MD   25 mg at 01/30/17 0800    Lab Results:  No results found for this or any previous visit (from the past 48 hour(s)).  Blood Alcohol level:  Lab Results  Component Value Date   ETH <5 01/27/2017   ETH <5 08/67/6195    Metabolic Disorder Labs: Lab Results  Component Value Date   HGBA1C 4.4 (L) 01/28/2017   MPG 79.58 01/28/2017   No results found for: PROLACTIN Lab Results  Component Value Date   CHOL 113 01/28/2017   TRIG 203 (H) 01/28/2017   HDL 20 (L) 01/28/2017   CHOLHDL 5.7 01/28/2017   VLDL 41 (H) 01/28/2017   LDLCALC 52 01/28/2017    Physical Findings: AIMS:  , ,  ,  ,    CIWA:    COWS:     Musculoskeletal: Strength & Muscle Tone: within normal limits Gait & Station: normal Patient leans: N/A  Psychiatric Specialty Exam: Physical Exam  ROS  Blood pressure (!) 144/94, pulse 74, temperature 98.6 F (37 C), temperature source Oral, resp. rate 18, height 5\' 9"  (1.753 m), weight 196 lb (88.9 kg), SpO2 98 %.Body mass index is 28.94 kg/m.  General Appearance: Fairly Groomed  Eye Contact:  Good  Speech:  Clear and Coherent   Volume:  Normal  Mood:  occasionally irritable  Affect:  Full Range  Thought Process:  Coherent  Orientation:  Full (Time, Place, and Person)  Thought Content:  Negative  Suicidal Thoughts:  No  Homicidal Thoughts:  No  Memory:  Negative  Judgement:  Impaired  Insight:  Fair  Psychomotor Activity:  Normal  Handed:    AIMS (if indicated):     Assets:  Chief Executive Officer Social Support  ADL's:  Intact  Cognition:  WNL  Sleep:  Number of Hours: 5.3   Treatment Plan Summary: 51 year old gentleman with a history of schizoaffective disorder and multiple hospitalizations, or sending to the ED with subjective complaints of hematuria. He appeared irritable and hostile in the ED, which was continued after  admission to the unit. Speaking to him in the afternoon, he endorses that he has been keeping well and would like only the clozapine and clonazepam. Does not display any affective symptoms currently and endorses that he does not want to take valproic acid. In this essentially not clear what the severity of psychopathology is at this point. For now we will continue to observe, administer him clozapine and perphenazine. Depending on how he reconstitutes over the next few days, we will titrate doses of his other psychotropics. We'll keep Depakote off his regimen, even previous instance of hyperammonemia with Depakote. Lithium caused toxicity and renal failure and hence needs to be kept off his regimen. Daily contact with patient to assess and evaluate symptoms and progress in treatment, Medication management and Plan     It is possible that patient is starting to have affective symptoms because of being off his mood stabilizer. Does not agree to take either Depakote or lithium. Does not want to take any other antipsychotics apart from Clozaril. This will be an ongoing discussion. Unfortunately the perphenazine which was part of this initial regimen was not continued following admission, he  refused to take it anyways from our initial discussion. We will attempt to give him perphenazine this evening.  #Schizoaffective disorder Continue clozapine 300 mg. We could think about increasing the dose, however will need careful titration to optimize dosing with side effects. Has not been taking perphenazine, will restart perphenazine at 4 mg tonight and continue at 4 mg twice a day from tomorrow. D/C divaloproex for now. Continue Klonopin 1.5 mg QHS. Thorazine 50 mg Q4H PRN -  He refused this medication, will keep it on his regimen.  #HTN Continue metoprolol XL 25 mg daily  #GERD Continue ranitidine 150 mg twice a day  #Smoking cessation -  refused  #QTc - 439 ms  Lipid Panel     Component Value Date/Time   CHOL 113 01/28/2017 0646   TRIG 203 (H) 01/28/2017 0646   HDL 20 (L) 01/28/2017 0646   CHOLHDL 5.7 01/28/2017 0646   VLDL 41 (H) 01/28/2017 0646   LDLCALC 52 01/28/2017 0646     Discharge planning with ACT team next week. Observation Level/Precautions:  Continuous Observation  Laboratory:  NA  Psychotherapy:    Medications:    Consultations:    Discharge Concerns:    Estimated LOS:  Other:       Treatment Plan Summary: Daily contact with patient to assess and evaluate symptoms and progress in treatment and Medication management  Ramond Dial, MD 01/30/2017, 12:43 PM

## 2017-01-30 NOTE — Progress Notes (Signed)
Pt asleep at this time. Respirations noted and unlabored. Prior to going to sleep pt was still irritable / agitated; he threw the tv remote at staff, proceeded to tipping over his tray to the floor which was caught by staff "I fucking dizzy bitch" and yelled another staff "shut up" when vitals were taken. Continued support and encouragement offered. Pt safe on unit.

## 2017-01-30 NOTE — Progress Notes (Addendum)
D: Pt A & O to self and place. Presents with blunted affect and agitation. Observed pacing in hall, has been loud and verbally aggressive towards male staff unprovoked "you bitch, you monkey ass bitch, I hate your ass, I have friends that are [place] and the [place] said to tell you that you're a monkey" "you fake ass bitches go fuck your husbands tonight, I hope he fuck you so hard; your pussy don't work no more, I'm the mother fucking devil, I take care of your fucking asses bitch". Spat out some of his medications on initial offer this AM. Verbal redirections ineffective thus far.  A: Medications given per MD's orders with security assistance due to pt's aggression towards staff. Support and encouragement provided to pt. Routine safety checks maintained on unit.  R: Pt's mood remains labile. POC maintained for safety.

## 2017-01-30 NOTE — [facility] Group Notes (Signed)
[facility] Group Notes:  (Nursing/MHT/Case Management/Adjunct)  Date:  01/30/2017  Time:  10:18 PM  Type of Therapy:  Group Therapy  Participation Level:  Active  Participation Quality:  Appropriate  Affect:  Appropriate  Cognitive:  Appropriate  Insight:  Appropriate  Engagement in Group:  Engaged  Modes of Intervention:  Discussion  Summary of Progress/Problems:  Kandis Fantasia 01/30/2017, 10:18 PM

## 2017-01-31 DIAGNOSIS — F250 Schizoaffective disorder, bipolar type: Principal | ICD-10-CM

## 2017-01-31 LAB — URINE DRUG SCREEN, QUALITATIVE (ARMC ONLY)
AMPHETAMINES, UR SCREEN: NOT DETECTED
BARBITURATES, UR SCREEN: NOT DETECTED
BENZODIAZEPINE, UR SCRN: POSITIVE — AB
COCAINE METABOLITE, UR ~~LOC~~: NOT DETECTED
Cannabinoid 50 Ng, Ur ~~LOC~~: NOT DETECTED
MDMA (Ecstasy)Ur Screen: NOT DETECTED
Methadone Scn, Ur: NOT DETECTED
OPIATE, UR SCREEN: NOT DETECTED
PHENCYCLIDINE (PCP) UR S: NOT DETECTED
Tricyclic, Ur Screen: NOT DETECTED

## 2017-01-31 MED ORDER — LORAZEPAM 2 MG/ML IJ SOLN
2.0000 mg | Freq: Four times a day (QID) | INTRAMUSCULAR | Status: DC | PRN
  Administered 2017-01-31 – 2017-02-02 (×5): 2 mg via INTRAMUSCULAR
  Filled 2017-01-31 (×5): qty 1

## 2017-01-31 MED ORDER — LORAZEPAM 2 MG/ML IJ SOLN
2.0000 mg | Freq: Once | INTRAMUSCULAR | Status: AC
  Administered 2017-01-31: 2 mg via INTRAMUSCULAR

## 2017-01-31 MED ORDER — HALOPERIDOL 5 MG PO TABS
10.0000 mg | ORAL_TABLET | Freq: Four times a day (QID) | ORAL | Status: DC | PRN
  Administered 2017-02-01 – 2017-02-03 (×3): 10 mg via ORAL
  Filled 2017-01-31 (×3): qty 2

## 2017-01-31 MED ORDER — HALOPERIDOL LACTATE 5 MG/ML IJ SOLN
5.0000 mg | Freq: Once | INTRAMUSCULAR | Status: AC
  Administered 2017-01-31: 5 mg via INTRAMUSCULAR

## 2017-01-31 MED ORDER — DIPHENHYDRAMINE HCL 50 MG/ML IJ SOLN
50.0000 mg | Freq: Four times a day (QID) | INTRAMUSCULAR | Status: DC | PRN
  Administered 2017-01-31 – 2017-02-02 (×3): 50 mg via INTRAMUSCULAR
  Filled 2017-01-31 (×3): qty 1

## 2017-01-31 MED ORDER — HALOPERIDOL LACTATE 5 MG/ML IJ SOLN
INTRAMUSCULAR | Status: AC
  Filled 2017-01-31: qty 1

## 2017-01-31 MED ORDER — HALOPERIDOL LACTATE 5 MG/ML IJ SOLN
10.0000 mg | Freq: Four times a day (QID) | INTRAMUSCULAR | Status: DC | PRN
  Administered 2017-01-31 – 2017-02-02 (×3): 10 mg via INTRAMUSCULAR
  Filled 2017-01-31 (×4): qty 2

## 2017-01-31 MED ORDER — TEMAZEPAM 15 MG PO CAPS
30.0000 mg | ORAL_CAPSULE | Freq: Every day | ORAL | Status: DC
  Administered 2017-01-31 – 2017-02-06 (×6): 30 mg via ORAL
  Filled 2017-01-31 (×7): qty 2

## 2017-01-31 MED ORDER — DIPHENHYDRAMINE HCL 50 MG/ML IJ SOLN
50.0000 mg | Freq: Once | INTRAMUSCULAR | Status: AC
  Administered 2017-01-31: 50 mg via INTRAMUSCULAR

## 2017-01-31 MED ORDER — DIVALPROEX SODIUM 500 MG PO DR TAB
500.0000 mg | DELAYED_RELEASE_TABLET | Freq: Two times a day (BID) | ORAL | Status: DC
  Administered 2017-02-02 – 2017-02-06 (×2): 500 mg via ORAL
  Filled 2017-01-31 (×7): qty 1

## 2017-01-31 MED ORDER — LORAZEPAM 2 MG PO TABS
2.0000 mg | ORAL_TABLET | Freq: Four times a day (QID) | ORAL | Status: DC | PRN
  Administered 2017-02-02 – 2017-02-03 (×2): 2 mg via ORAL
  Filled 2017-01-31 (×2): qty 1

## 2017-01-31 MED ORDER — DIPHENHYDRAMINE HCL 25 MG PO CAPS
50.0000 mg | ORAL_CAPSULE | Freq: Four times a day (QID) | ORAL | Status: DC | PRN
  Administered 2017-02-01 – 2017-02-03 (×3): 50 mg via ORAL
  Filled 2017-01-31 (×3): qty 2

## 2017-01-31 NOTE — Progress Notes (Signed)
Patient in the back hall milieu (Day Room), turned the tables upside down, trash the area with papers, dayroom closed and was redirected to his room; remains with 1:1 Air cabin crew and Johnson; hyper verbal, irritable,screaming profanity, cursing everybody, hostile, verbally abusive, verbally aggressive and communicating threat.

## 2017-01-31 NOTE — Progress Notes (Signed)
Sitter 1:1, safety maintained pt in room resting.

## 2017-01-31 NOTE — Progress Notes (Signed)
Sitter 1:1, safety maintained. Pt received 1700 medications and he took them willingly.Pt has calmed down but continues to be focused on going home.

## 2017-01-31 NOTE — Progress Notes (Signed)
Sitter 1:1, safety maintained. Pt tolerated medications well. Seen sleeping quietly in room.

## 2017-01-31 NOTE — Progress Notes (Signed)
DAR Note: Pt continue to be very intrusive, disruptive and aggressive. Pt repeatedly had several extremely loud outbursts waking and scaring other Patients on the unit; "get away from me; who are you to be talking." Pt made several inappropriate threatening gestures at male staffs. Pt observed pointing, punching and shouting at a person that is not present. Pt could not be redirected even after he was shaved, given snack and drinks. Pt was offered and given PRN IM medications-not effective. All patient's questions and concerns addressed. Support, encouragement, and safe environment provided. Could be very needy.

## 2017-01-31 NOTE — Progress Notes (Signed)
Sitter 1:1, safety maintained Pt resting in bed quietly.

## 2017-01-31 NOTE — Progress Notes (Signed)
Pt began to complain and yell at staff. Pt seen banging on door loud and screaming. He is threatening and hostile towards staff. Security called down to unit. Pt was given ativan, benadryl, and geodon IM for agitation. Pt willingly took IM medications while security and nursing present. Will continue to monitor.

## 2017-01-31 NOTE — Progress Notes (Signed)
Sitter 1:1, safety maintained. Pt in bed lunch brought to room by tech.

## 2017-01-31 NOTE — Progress Notes (Signed)
Patient in the back hall milieu (Day Room) watching TV, with 1:1 Firefighter; ongoing verbal assaults; early bedtime medications, Temazepam 30 mg, given with Depakote 500 mg; patient removed, threw Depakote on the table, "I don't want that, I am not taking it, I told the doctor that I will not.Marland KitchenMarland Kitchen"

## 2017-01-31 NOTE — Progress Notes (Signed)
[facility] Physician Opinion Progress Note for Medication Administration to Non-consenting Patients (For Involuntarily Committed Patients)  Patient: Nicholas Cole Date of Birth: [id number] MRN: [id number]  Reason for the Medication: There is, without the benefit of the specific treatment measure, a significant possibility that the patient will harm self or others before improvement of the patient's condition is realized.  Consideration of Side Effects: Consideration of the side effects related to the medication plan has been given.  Rationale for Medication Administration: Patient has been violent agitated and threatening throughout the day. He is not responding to reasonable verbal intervention or other means to de-escalate the situation. He is at significant risk of harming himself, staff, or other patients without medication    Alethia Berthold, MD 01/31/17  4:51 PM   This documentation is good for (7) seven days from the date of the MD signature. New documentation must be completed every seven (7) days with detailed justification in the medical record if the patient requires continued non-emergent administration of psychotropic medications.

## 2017-01-31 NOTE — Progress Notes (Signed)
1:1 Note: Pt aggressive behavior continues to escalate as PRN IM medications not effective. Pt moved to the green hall lockdown area and put on 1:1 security monitoring for safety. Pt continue to be very loud destructive in the room-turned the room upside-down. If aggressive behaviors and destructions continue, Pt could be placed in the seclusion room. Securities and staffs are present with Pt at this time.

## 2017-01-31 NOTE — Tx Team (Signed)
Interdisciplinary Treatment and Diagnostic Plan Update  01/31/2017 Time of Session: 11:00am Nicholas Cole MRN: [id number]  Principal Diagnosis: Schizoaffective disorder, bipolar type [facility])  Secondary Diagnoses: Principal Problem:   Schizoaffective disorder, bipolar type ([place]) Active Problems:   Hypertension   GERD (gastroesophageal reflux disease)   Current Medications:  Current Facility-Administered Medications  Medication Dose Route Frequency Provider Last Rate Last Dose  . acetaminophen (TYLENOL) tablet 650 mg  650 mg Oral Q6H PRN Clapacs, John T, MD      . alum & mag hydroxide-simeth (MAALOX/MYLANTA) 200-200-20 MG/5ML suspension 30 mL  30 mL Oral Q4H PRN Clapacs, John T, MD      . cloZAPine (CLOZARIL) tablet 150 mg  150 mg Oral BID Ramond Dial, MD   150 mg at 01/31/17 0837  . diphenhydrAMINE (BENADRYL) capsule 50 mg  50 mg Oral Q6H PRN Pucilowska, Jolanta B, MD       Or  . diphenhydrAMINE (BENADRYL) injection 50 mg  50 mg Intramuscular Q6H PRN Pucilowska, Jolanta B, MD      . divalproex (DEPAKOTE) DR tablet 500 mg  500 mg Oral Q12H Pucilowska, Jolanta B, MD      . famotidine (PEPCID) tablet 20 mg  20 mg Oral BID Clapacs, John T, MD   20 mg at 01/31/17 0837  . haloperidol (HALDOL) tablet 10 mg  10 mg Oral Q6H PRN Pucilowska, Jolanta B, MD       Or  . haloperidol lactate (HALDOL) injection 10 mg  10 mg Intramuscular Q6H PRN Pucilowska, Jolanta B, MD      . LORazepam (ATIVAN) tablet 2 mg  2 mg Oral Q6H PRN Pucilowska, Jolanta B, MD       Or  . LORazepam (ATIVAN) injection 2 mg  2 mg Intramuscular Q6H PRN Pucilowska, Jolanta B, MD      . magnesium hydroxide (MILK OF MAGNESIA) suspension 30 mL  30 mL Oral Daily PRN Clapacs, John T, MD      . metoprolol succinate (TOPROL-XL) 24 hr tablet 25 mg  25 mg Oral Daily Clapacs, Madie Reno, MD   25 mg at 01/31/17 0837  . perphenazine (TRILAFON) tablet 4 mg  4 mg Oral BID Ramond Dial, MD   4 mg at 01/31/17 0837  . temazepam (RESTORIL)  capsule 30 mg  30 mg Oral QHS Pucilowska, Jolanta B, MD      . ziprasidone (GEODON) injection 10 mg  10 mg Intramuscular Q6H PRN Ramond Dial, MD   10 mg at 01/31/17 1249   PTA Medications: Prescriptions Prior to Admission  Medication Sig Dispense Refill Last Dose  . clonazePAM (KLONOPIN) 0.5 MG tablet Take 1 tablet (0.5 mg total) by mouth 3 (three) times daily. 90 tablet 0 01/26/2017 at 1900  . cloZAPine (CLOZARIL) 100 MG tablet Take 3 tablets (300 mg total) by mouth daily. 90 tablet 0 01/26/2017 at 1900  . divalproex (DEPAKOTE) 500 MG DR tablet Take 1 tablet (500 mg total) by mouth every 12 (twelve) hours. (Patient not taking: Reported on 01/27/2017) 60 tablet 0 Not Taking at Unknown time  . metoprolol succinate (TOPROL-XL) 25 MG 24 hr tablet Take 1 tablet (25 mg total) by mouth daily. 30 tablet 0 Past Month at Unknown time  . perphenazine (TRILAFON) 4 MG tablet Take 1 tablet (4 mg total) by mouth at bedtime. (Patient not taking: Reported on 01/27/2017) 30 tablet 0 Not Taking at Unknown time  . ranitidine (ZANTAC) 150 MG tablet Take 150 mg by mouth 2 (two) times daily.  Not Taking at Unknown time    Patient Stressors: Health problems Medication change or noncompliance  Patient Strengths: Capable of independent living Communication skills  Treatment Modalities: Medication Management, Group therapy, Case management,  1 to 1 session with clinician, Psychoeducation, Recreational therapy.   Physician Treatment Plan for Primary Diagnosis: Schizoaffective disorder, bipolar type (HCC) Long Term Goal(s): Improvement in symptoms so as ready for discharge Improvement in symptoms so as ready for discharge   Short Term Goals: Ability to identify changes in lifestyle to reduce recurrence of condition will improve Ability to verbalize feelings will improve Ability to identify changes in lifestyle to reduce recurrence of condition will improve  Medication Management: Evaluate patient's response,  side effects, and tolerance of medication regimen.  Therapeutic Interventions: 1 to 1 sessions, Unit Group sessions and Medication administration.  Evaluation of Outcomes: Progressing  Physician Treatment Plan for Secondary Diagnosis: Principal Problem:   Schizoaffective disorder, bipolar type (HCC) Active Problems:   Hypertension   GERD (gastroesophageal reflux disease)  Long Term Goal(s): Improvement in symptoms so as ready for discharge Improvement in symptoms so as ready for discharge   Short Term Goals: Ability to identify changes in lifestyle to reduce recurrence of condition will improve Ability to verbalize feelings will improve Ability to identify changes in lifestyle to reduce recurrence of condition will improve     Medication Management: Evaluate patient's response, side effects, and tolerance of medication regimen.  Therapeutic Interventions: 1 to 1 sessions, Unit Group sessions and Medication administration.  Evaluation of Outcomes: Not met   RN Treatment Plan for Primary Diagnosis: Schizoaffective disorder, bipolar type (HCC) Long Term Goal(s): Knowledge of disease and therapeutic regimen to maintain health will improve  Short Term Goals: Ability to remain free from injury will improve  Medication Management: RN will administer medications as ordered by provider, will assess and evaluate patient's response and provide education to patient for prescribed medication. RN will report any adverse and/or side effects to prescribing provider.  Therapeutic Interventions: 1 on 1 counseling sessions, Psychoeducation, Medication administration, Evaluate responses to treatment, Monitor vital signs and CBGs as ordered, Perform/monitor CIWA, COWS, AIMS and Fall Risk screenings as ordered, Perform wound care treatments as ordered.  Evaluation of Outcomes: Not Met   LCSW Treatment Plan for Primary Diagnosis: Schizoaffective disorder, bipolar type (HCC) Long Term Goal(s): Safe  transition to appropriate next level of care at discharge, Engage patient in therapeutic group addressing interpersonal concerns.  Short Term Goals: Engage patient in aftercare planning with referrals and resources  Therapeutic Interventions: Assess for all discharge needs, 1 to 1 time with Social worker, Explore available resources and support systems, Assess for adequacy in community support network, Educate family and significant other(s) on suicide prevention, Complete Psychosocial Assessment, Interpersonal group therapy.  Evaluation of Outcomes: Not Progressing   Progress in Treatment: Attending groups: No. Participating in groups: No. Taking medication as prescribed: Yes. Toleration medication: No.  Patient more agitated and threatening in behavior Family/Significant other contact made: No, will contact:  contact to be made Patient understands diagnosis: No. Discussing patient identified problems/goals with staff: No. Medical problems stabilized or resolved: Yes. Denies suicidal/homicidal ideation: Yes. Issues/concerns per patient self-inventory: Yes. Other: Patient currently has a sitter 1:1 to maintain safety,  IM medications.  New problem(s) identified: Yes, Describe:  increased agitation  New Short Term/Long Term Goal(s):  Discharge Plan or Barriers:  Patient lives alone in his own apartment.  He is active with [facility] ACT team.  Reason for Continuation of Hospitalization:  Aggression Medical Issues Medication stabilization  Estimated Length of Stay:  3-5 days  Attendees: Patient: refused, unable to attend due to agitation 01/31/2017 3:25 PM  Physician: Duanne Guess, MD 01/31/2017 3:25 PM  Nursing: Rosebud Poles RN 01/31/2017 3:25 PM  RN Care Manager: 01/31/2017 3:25 PM  Social Worker: Dossie Arbour, LCSW 01/31/2017 3:25 PM  Recreational Therapist:  01/31/2017 3:25 PM  Other:  01/31/2017 3:25 PM  Other:  01/31/2017 3:25 PM  Other: 01/31/2017 3:25 PM    Scribe for Treatment Team: Lilly Cove, LCSW 01/31/2017 3:25 PM

## 2017-01-31 NOTE — Progress Notes (Signed)
Sitter 1:1, safety maintained. Pt seen in shower at this time.

## 2017-01-31 NOTE — Progress Notes (Signed)
Paranoid, delusional, intermittent explosive outburst continues, responding to internal stimuli, difficult to redirect, disruptive behaviors contained and structured; remains on 1:1 Chemical engineer.

## 2017-01-31 NOTE — Progress Notes (Signed)
Sitter 1:1, safety maintained. Pt seen in room resting.

## 2017-01-31 NOTE — Progress Notes (Signed)
Patient ID: Nicholas Cole, male   DOB: Jul 12, 1965, 51 y.o.   MRN: [id number]  Patient refused to take perphenazine 4 mg last night. Patient continued to be verbally and physically agitated overnight. Received call from nursing staff close to 1 AM about patient being disruptive on the unit.   1:1 was ordered for patient. He received ziprasidone 10 mg and lorazepam 2 mg IM after threatening nursing staff and other patients close to 12 AM.  Was informed by nursing at 3.25 AM that patient was still verbally disruptive and threatening staff, voicing profanity too. Haloperidol 5 mg + lorazepam 2 mg and benadryl 50 mg IM.

## 2017-01-31 NOTE — Plan of Care (Signed)
Problem: Safety: Goal: Periods of time without injury will increase Outcome: Progressing Pt remains safe while in hospital injury free.    

## 2017-01-31 NOTE — Progress Notes (Signed)
Sitter 1:1, safety maintained. Pt in room calm.

## 2017-01-31 NOTE — Progress Notes (Signed)
Sitter 1:1, safety maintained. Pt is resting in bed quietly.

## 2017-01-31 NOTE — Progress Notes (Signed)
1:1 Note: Pt is calm and interacting appropriately with staffs at this time. Pt did not sleep at all. 1:1 staff and security present with Pt at this time.

## 2017-01-31 NOTE — Progress Notes (Signed)
Pt took all am medications earlier Sitter 1:1, safety maintained. In room calm.

## 2017-01-31 NOTE — [facility] Group Notes (Signed)
[place] Group Notes:  (Nursing/MHT/Case Management/Adjunct)  Date:  01/31/2017  Time:  10:44 AM  Type of Therapy:  Psychoeducational Skills  Participation Level:  Did Not Attend    Drake Leach 01/31/2017, 10:44 AM

## 2017-01-31 NOTE — Progress Notes (Signed)
Patient in the back hall milieu (Day Room) watching TV, with 1:1 Firefighter; hyper verbal, irritable, loud volume of voice, vocabulary rich in profanity, hostile, verbally abusive, verbally aggressive and communicating threat.

## 2017-01-31 NOTE — Progress Notes (Signed)
Benadryl 50 mg, Haldol 10 mg and Ativan 2 mg all IM, PRNs given without physical hold; remains on 1:1 Air cabin crew and with a Scientist, research (physical sciences); agitated, banging on the door and trashed room and hallway; broke the door to his room. Snacks and beverages provided.

## 2017-01-31 NOTE — Progress Notes (Addendum)
[facility] MD Progress Note  01/31/2017 1:46 PM Nicholas Cole  MRN:  [id number] Subjective:    01/30/2017. He endorses that he is doing well. He is interactive with other patients on the unit. Denies any auditory or visual hallucinations. Says he had a good night's rest and slept fine.  Gets occasionally irritable with nursing staff, situations being completely unprovoked. Reported that he does not want to take the Depakote.  Care Everywhere documents that he had hyperammonemia with Depakote. Lithium was also tried but he developed lithium toxicity with renal failure. Current home medications include perphenazine, Klonopin and clozapine. There is some degree of discrepancy in the dosing of Klonopin which varies from 0.5 mg daily to 0.5 mg 3 times daily. However he would like about consolidated at night and so his nighttime dosing is currently 1.5 mg at bedtime.  Was also worked up for myeloma in the past, considering he had elevated parathyroid hormone levels and abnormal immunoglobulin levels. Serum protein electrophoresis was unremarkable at [facility] nephrology.  01/31/2017. Nicholas Cole has been agitated and threatening since yesterday, including multiple episodes last night. He did not participate in treatment team meeting this morning. I spoke with him during lunch which he took on the back hall with security and sitter. He was pleasant. He recognized me from previous admissions. He reported living independently for the past 7 months. It is likely that he discontinued medications a while ago. His Depakote level was low on admission but it is unclear if he was prescribed Depakote recently. He has a history of hyperammonemia on Depakote. Clozaril level was not checked on admission. Shortly after my interview, the patient started yelling screening banging on the door and received IM injection of Geodon. I will ask Dr. Weber Cooks for a second opinion and order NEFM.   Per nursing: Pt began to complain and yell at staff.  Pt seen banging on door loud and screaming. He is threatening and hostile towards staff. Security called down to unit. Pt was given ativan, benadryl, and geodon IM for agitation. Pt willingly took IM medications while security and nursing present. Will continue to monitor.   Principal Problem: Schizoaffective disorder, bipolar type ([place]) Diagnosis:   Patient Active Problem List   Diagnosis Date Noted  . GERD (gastroesophageal reflux disease) [K21.9] 01/30/2017  . Chronic renal failure [N18.9] 01/27/2017  . Hypertension [I10] 01/27/2017  . Lithium toxicity [T56.891A] 11/06/2016  . ARF (acute renal failure) ([place]) [N17.9] 11/05/2016  . Dehydration [E86.0] 11/05/2016  . Rhabdomyolysis [M62.82] 11/05/2016  . Schizoaffective disorder, bipolar type ([place]) [F25.0] 11/05/2016   Total Time spent with patient: 30 minutes  Past Psychiatric History: Long-standing mental health problems diagnosis schizoaffective disorder. Multiple hospitalizations including state hospitalizations. No history that I know of suicide attempts. Has been threatening and aggressive at times in the past. Patient is on clozapine and Depakote. I am not sure whether lithium is still being used given his worsening renal function.  Past Medical History:  Past Medical History:  Diagnosis Date  . Acute renal failure (ARF) ([place])   . Schizophrenia ([place])    History reviewed. No pertinent surgical history. Family History: History reviewed. No pertinent family history. Family Psychiatric  History:  Social History:  History  Alcohol Use No     History  Drug Use No    Social History   Social History  . Marital status: Single    Spouse name: N/A  . Number of children: N/A  . Years of education: N/A   Social  History Main Topics  . Smoking status: Former Research scientist (life sciences)  . Smokeless tobacco: Never Used  . Alcohol use No  . Drug use: No  . Sexual activity: Not Asked   Other Topics Concern  . None   Social History Narrative  .  None   Additional Social History:    Lives in an apartment in [place]. Endorses that his siblings are alive, but he does not keep in touch with them. Spends his day at the park at times. Says he gets his SSI check as weekly payments.  Denies any substance use currently.       Sleep: Good  Appetite:  Good  Current Medications: Current Facility-Administered Medications  Medication Dose Route Frequency Provider Last Rate Last Dose  . haloperidol lactate (HALDOL) 5 MG/ML injection           . acetaminophen (TYLENOL) tablet 650 mg  650 mg Oral Q6H PRN Clapacs, John T, MD      . alum & mag hydroxide-simeth (MAALOX/MYLANTA) 200-200-20 MG/5ML suspension 30 mL  30 mL Oral Q4H PRN Clapacs, John T, MD      . cloZAPine (CLOZARIL) tablet 150 mg  150 mg Oral BID Ramond Dial, MD   150 mg at 01/31/17 0837  . diphenhydrAMINE (BENADRYL) capsule 50 mg  50 mg Oral Q6H PRN Devaeh Amadi B, MD       Or  . diphenhydrAMINE (BENADRYL) injection 50 mg  50 mg Intramuscular Q6H PRN Karrah Mangini B, MD      . famotidine (PEPCID) tablet 20 mg  20 mg Oral BID Clapacs, John T, MD   20 mg at 01/31/17 0837  . haloperidol (HALDOL) tablet 10 mg  10 mg Oral Q6H PRN Neyland Pettengill B, MD       Or  . haloperidol lactate (HALDOL) injection 10 mg  10 mg Intramuscular Q6H PRN Meaghan Whistler B, MD      . LORazepam (ATIVAN) tablet 2 mg  2 mg Oral Q6H PRN Jameka Ivie B, MD       Or  . LORazepam (ATIVAN) injection 2 mg  2 mg Intramuscular Q6H PRN Koleen Celia B, MD      . magnesium hydroxide (MILK OF MAGNESIA) suspension 30 mL  30 mL Oral Daily PRN Clapacs, John T, MD      . metoprolol succinate (TOPROL-XL) 24 hr tablet 25 mg  25 mg Oral Daily Clapacs, Madie Reno, MD   25 mg at 01/31/17 0837  . perphenazine (TRILAFON) tablet 4 mg  4 mg Oral BID Ramond Dial, MD   4 mg at 01/31/17 7253  . ziprasidone (GEODON) injection 10 mg  10 mg Intramuscular Q6H PRN Ramond Dial, MD   10  mg at 01/31/17 1249    Lab Results:  Results for orders placed or performed during the hospital encounter of 01/27/17 (from the past 57 hour(s))  Urine Drug Screen, Qualitative ([place] only)     Status: Abnormal   Collection Time: 01/31/17  4:15 AM  Result Value Ref Range   Tricyclic, Ur Screen NONE DETECTED NONE DETECTED   Amphetamines, Ur Screen NONE DETECTED NONE DETECTED   MDMA (Ecstasy)Ur Screen NONE DETECTED NONE DETECTED   Cocaine Metabolite,Ur [place] NONE DETECTED NONE DETECTED   Opiate, Ur Screen NONE DETECTED NONE DETECTED   Phencyclidine (PCP) Ur S NONE DETECTED NONE DETECTED   Cannabinoid 50 Ng, Ur [place] NONE DETECTED NONE DETECTED   Barbiturates, Ur Screen NONE DETECTED NONE DETECTED   Benzodiazepine, Ur Scrn POSITIVE (A)  NONE DETECTED   Methadone Scn, Ur NONE DETECTED NONE DETECTED    Comment: (NOTE) 562  Tricyclics, urine               Cutoff 1000 ng/mL 200  Amphetamines, urine             Cutoff 1000 ng/mL 300  MDMA (Ecstasy), urine           Cutoff 500 ng/mL 400  Cocaine Metabolite, urine       Cutoff 300 ng/mL 500  Opiate, urine                   Cutoff 300 ng/mL 600  Phencyclidine (PCP), urine      Cutoff 25 ng/mL 700  Cannabinoid, urine              Cutoff 50 ng/mL 800  Barbiturates, urine             Cutoff 200 ng/mL 900  Benzodiazepine, urine           Cutoff 200 ng/mL 1000 Methadone, urine                Cutoff 300 ng/mL 1100 1200 The urine drug screen provides only a preliminary, unconfirmed 1300 analytical test result and should not be used for non-medical 1400 purposes. Clinical consideration and professional judgment should 1500 be applied to any positive drug screen result due to possible 1600 interfering substances. A more specific alternate chemical method 1700 must be used in order to obtain a confirmed analytical result.  1800 Gas chromato graphy / mass spectrometry (GC/MS) is the preferred 1900 confirmatory method.     Blood Alcohol level:  Lab  Results  Component Value Date   ETH <5 01/27/2017   ETH <5 13/12/6576    Metabolic Disorder Labs: Lab Results  Component Value Date   HGBA1C 4.4 (L) 01/28/2017   MPG 79.58 01/28/2017   No results found for: PROLACTIN Lab Results  Component Value Date   CHOL 113 01/28/2017   TRIG 203 (H) 01/28/2017   HDL 20 (L) 01/28/2017   CHOLHDL 5.7 01/28/2017   VLDL 41 (H) 01/28/2017   LDLCALC 52 01/28/2017    Physical Findings: AIMS:  , ,  ,  ,    CIWA:    COWS:     Musculoskeletal: Strength & Muscle Tone: within normal limits Gait & Station: normal Patient leans: N/A  Psychiatric Specialty Exam: Physical Exam  Nursing note and vitals reviewed. Psychiatric: His affect is angry, labile and inappropriate. His speech is rapid and/or pressured. He is agitated, aggressive and combative. Cognition and memory are normal. He expresses impulsivity and inappropriate judgment.    Review of Systems  Psychiatric/Behavioral:       Agitated, threatening behavior.  All other systems reviewed and are negative.   Blood pressure (!) 143/90, pulse 96, temperature 98.6 F (37 C), temperature source Oral, resp. rate 18, height 5\' 9"  (1.753 m), weight 88.9 kg (196 lb), SpO2 98 %.Body mass index is 28.94 kg/m.  General Appearance: Fairly Groomed  Eye Contact:  Good  Speech:  Clear and Coherent  Volume:  Normal  Mood:  occasionally irritable  Affect:  Full Range  Thought Process:  Coherent  Orientation:  Full (Time, Place, and Person)  Thought Content:  Negative  Suicidal Thoughts:  No  Homicidal Thoughts:  No  Memory:  Negative  Judgement:  Impaired  Insight:  Fair  Psychomotor Activity:  Normal  Handed:  AIMS (if indicated):     Assets:  Communication Skills Housing Social Support  ADL's:  Intact  Cognition:  WNL  Sleep:  Number of Hours: 0     Observation Level/Precautions:  Continuous Observation  Laboratory:  NA  Psychotherapy:    Medications:    Consultations:     Discharge Concerns:    Estimated LOS:  Other:       Treatment Plan Summary: Daily contact with patient to assess and evaluate symptoms and progress in treatment and Medication management   Nicholas Cole is 51 year old gentleman with a history of schizoaffective disorder and multiple hospitalizations who came to the ER with complaints of hematuria. He appeared irritable and hostile in the ED, which was continued after admission to the unit.   1. Psychosis. We will continue Clozaril 300 mg nightly and Trilafon 12 mg daily for psychosis. We will restart low-dose Depakote. We will order Clozapine level.  2. Agitation. The combination of Haldol, Ativan, and Benadryl is available as needed.  3. Hypertension. He is on metoprolol.  4. GERD. He is on Pepcid.   5. Insomnia. We'll give Restoril.   6. Metabolic syndrome monitoring. Lipid panel, TSH, hemoglobin A1c are normal.  7. EKG. Sinus rhythm, QTc 424.  8. Disposition. He will likely return to his apartment. Follow-up with [facility].  Orson Slick, MD 01/31/2017, 1:46 PM

## 2017-01-31 NOTE — Progress Notes (Signed)
Sitter 1:1, safety maintained.  Pt is in room walking.

## 2017-02-01 MED ORDER — CLOZAPINE 100 MG PO TABS
200.0000 mg | ORAL_TABLET | Freq: Two times a day (BID) | ORAL | Status: DC
  Administered 2017-02-01 – 2017-02-07 (×12): 200 mg via ORAL
  Filled 2017-02-01 (×12): qty 2

## 2017-02-01 MED ORDER — PERPHENAZINE 4 MG PO TABS
8.0000 mg | ORAL_TABLET | Freq: Two times a day (BID) | ORAL | Status: DC
  Administered 2017-02-01 – 2017-02-07 (×6): 8 mg via ORAL
  Filled 2017-02-01 (×8): qty 2

## 2017-02-01 NOTE — Progress Notes (Signed)
Patient had to be asked to get out of the shower.

## 2017-02-01 NOTE — Progress Notes (Signed)
Granted request to take a shower, no temper tantrums, no aggressive or intimidating behaviors, remains on 1:1 Media planner & an Garment/textile technologist.

## 2017-02-01 NOTE — Plan of Care (Signed)
Problem: Safety: Goal: Periods of time without injury will increase Outcome: Progressing Pt will continue to be injury free the entire shift.

## 2017-02-01 NOTE — Progress Notes (Signed)
Patient ate lunch with out incidence. He ate 100% and drank 2 milks. He stated it was good and went back to bed.

## 2017-02-01 NOTE — Progress Notes (Signed)
Patient laying in bed yelling, "get me my food bitch."

## 2017-02-01 NOTE — Progress Notes (Signed)
MD in to see patient. He conversed with her.  He continues to be 1:1 with security present.

## 2017-02-01 NOTE — Progress Notes (Signed)
Patient laying in bed with eyes closed. No signs of distress noted. Will continue to monitor. He continues to be 1:1

## 2017-02-01 NOTE — Progress Notes (Signed)
Patient standing door yelling, 'I aiin't afraid of no ghost you black faggots."  Remains iin a contained and structured milieu. 1:1 Air cabin crew and campus officer with patient.

## 2017-02-01 NOTE — Progress Notes (Signed)
Patient lying in bed with eyes closed. Remains in a contained and structured milieu. 1:1 Orthoptist with patient.

## 2017-02-01 NOTE — Progress Notes (Signed)
Patient is observed in bed with eyes closed and audible snoring. Breathing unlabored, no distress noted. Programmer, systems with patient in the contained and structured milieu.

## 2017-02-01 NOTE — Progress Notes (Signed)
Pt in bed sleeping, remains on 1:1 Air cabin crew with a Scientist, research (physical sciences).

## 2017-02-01 NOTE — Progress Notes (Signed)
Patient ID: Nicholas Cole, male   DOB: 04-Nov-1965, 51 y.o.   MRN: [id number] Still sleeping, remains on 1:1

## 2017-02-01 NOTE — Progress Notes (Signed)
1:1 continues, sitter in place. Pt is resting with eyes closed. No distress noted. 15 min checks continues.

## 2017-02-01 NOTE — Progress Notes (Signed)
Pt continues to be verbally abusive. Pt refused meds. Pt came out into the hallway. When asked if he was going to take meds, pt began yelling stated, "No I'm not taking those fuckin meds, so you can take your ass out of here". Pt then went back into room and continue to holler. 1:1 continues, sitter is within arm reach of pt, continues and 15 min safety checks continues.

## 2017-02-01 NOTE — Progress Notes (Signed)
Patient in hallway yelling at the top of his voice, Stating, "Scared you didn't I with your scary ass."  Breakfast served to patient and medications administered to patient by nursing staff. Remains iin a contained and structured milieu. 1:1 Air cabin crew and campus officer with patient.

## 2017-02-01 NOTE — Progress Notes (Signed)
Patient banging at the door, getting aggressive, yelling out loud, "I want my medication....." Haldol 10 mg PO, Benadryl 50 mg PO and Tylenol 650 mg PO PRNs given at 6036; remains in a contained and structured milieu, 1:1 Air cabin crew and a Scientist, research (physical sciences).

## 2017-02-01 NOTE — Progress Notes (Signed)
Pt sitting in room on bed talking to himself. Pt continues on 1:1 sitter in place.

## 2017-02-01 NOTE — Progress Notes (Signed)
Patient over heard in the shower talking to himself.

## 2017-02-01 NOTE — Progress Notes (Signed)
Patient still continues to be verbally abusive to staff. He becomes sexually inappropriate at times with Probation officer. Stating you know you with the wrong man you need to be with me. He has supper tray delivered he eats with out incidence. He has meds he took all except the pepcid and states I dont have heart burn. Writer informed him it was to prevent heart burn but he still refused.

## 2017-02-01 NOTE — Progress Notes (Signed)
Observed in bed sleeping underneath the blanket, remains on 1:1 Air cabin crew with a Scientist, research (physical sciences), responded well to PRNs. Will continue to monitor and reduced environmental stimuli.

## 2017-02-01 NOTE — [facility] Group Notes (Signed)
[place] [address] Group Notes:  (Nursing/MHT/Case Management/Adjunct)  Date:  02/01/2017  Time:  2:41 PM  Type of Therapy:  Psychoeducational Skills  Participation Level:  Did Not Attend    Alois Cliche 02/01/2017, 2:41 PM

## 2017-02-01 NOTE — Progress Notes (Signed)
Patient up on the unit hollering at staff. Yelled at one staff walking by, "you fat. You too big. You a ugly bitch." he then hit on the door. Patient was not directable with conversation. He did ask for juice. He was asked to move from the door. He refused so no juice was given. He was given another opportunity. He did move. Speech is loud, nonsensical, and verbally abusive to staff. He calls the black people monkeys, and white people wihite face. He called one of the security guards a [place].

## 2017-02-01 NOTE — Progress Notes (Signed)
Contained & structured milieu continues, roaming freely in the back hall, continuously monitored on 1:1 for Safety with Animal nutritionist.

## 2017-02-01 NOTE — Progress Notes (Signed)
Patient ate 100% of his supper and drank a hot chocolate and a sprite.

## 2017-02-01 NOTE — Progress Notes (Signed)
[facility] MD Progress Note  02/01/2017 2:10 PM Nicholas Cole  MRN:  [id number] Subjective:    01/30/2017. He endorses that he is doing well. He is interactive with other patients on the unit. Denies any auditory or visual hallucinations. Says he had a good night's rest and slept fine.  Gets occasionally irritable with nursing staff, situations being completely unprovoked. Reported that he does not want to take the Depakote.  Care Everywhere documents that he had hyperammonemia with Depakote. Lithium was also tried but he developed lithium toxicity with renal failure. Current home medications include perphenazine, Klonopin and clozapine. There is some degree of discrepancy in the dosing of Klonopin which varies from 0.5 mg daily to 0.5 mg 3 times daily. However he would like about consolidated at night and so his nighttime dosing is currently 1.5 mg at bedtime. Was also worked up for myeloma in the past, considering he had elevated parathyroid hormone levels and abnormal immunoglobulin levels. Serum protein electrophoresis was unremarkable at [facility] nephrology.  01/31/2017. [name] has been agitated and threatening since yesterday, including multiple episodes last night. He did not participate in treatment team meeting this morning. I spoke with him during lunch which he took on the back hall with security and sitter. He was pleasant. He recognized me from previous admissions. He reported living independently for the past 7 months. It is likely that he discontinued medications a while ago. His Depakote level was low on admission but it is unclear if he was prescribed Depakote recently. He has a history of hyperammonemia on Depakote. Clozaril level was not checked on admission. Shortly after my interview, the patient started yelling screening banging on the door and received IM injection of Geodon. I will ask Dr. Weber Cooks for a second opinion and order NEFM.  02/01/2017. [name] is still agitated, threatening and  grandiose today. 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 He was given injection of haldol and benadrly this morning. He takes his meals with no problem. He has 1:1 sitter and security guard on the back hall. He slept 4 hours only with 30 mg of Restoril. He refuses Depakote. There is a history of hyperammonemia on Depakote. There is also history of Lithium toxicity.   Per nursing: Patient in hallway yelling at the top of his voice, Stating, "Scared you didn't I with your scary ass."  Breakfast served to patient and medications administered to patient by nursing staff. Remains iin a contained and structured milieu. 1:1 Air cabin crew and campus officer with patient.   Principal Problem: Schizoaffective disorder, bipolar type ([place]) Diagnosis:   Patient Active Problem List   Diagnosis Date Noted  . GERD (gastroesophageal reflux disease) [K21.9] 01/30/2017  . Chronic renal failure [N18.9] 01/27/2017  . Hypertension [I10] 01/27/2017  . Lithium toxicity [T56.891A] 11/06/2016  . ARF (acute renal failure) ([place]) [N17.9] 11/05/2016  . Dehydration [E86.0] 11/05/2016  . Rhabdomyolysis [M62.82] 11/05/2016  . Schizoaffective disorder, bipolar type ([place]) [F25.0] 11/05/2016   Total Time spent with patient: 30 minutes  Past Psychiatric History: Long-standing mental health problems diagnosis schizoaffective disorder. Multiple hospitalizations including state hospitalizations. No history that I know of suicide attempts. Has been threatening and aggressive at times in the past. Patient is on clozapine and Depakote. I am not sure whether lithium is still being used given his worsening renal function.  Past Medical History:  Past Medical History:  Diagnosis Date  . Acute renal failure (ARF) ([place])   . Schizophrenia ([place])    History reviewed. No pertinent  surgical history. Family History: History reviewed. No pertinent family history. Family Psychiatric  History:  Social History:  History  Alcohol Use No     History   Drug Use No    Social History   Social History  . Marital status: Single    Spouse name: N/A  . Number of children: N/A  . Years of education: N/A   Social History Main Topics  . Smoking status: Former Research scientist (life sciences)  . Smokeless tobacco: Never Used  . Alcohol use No  . Drug use: No  . Sexual activity: Not Asked   Other Topics Concern  . None   Social History Narrative  . None   Additional Social History:    Lives in an apartment in [place]. Endorses that his siblings are alive, but he does not keep in touch with them. Spends his day at the park at times. Says he gets his SSI check as weekly payments.  Denies any substance use currently.       Sleep: Good  Appetite:  Good  Current Medications: Current Facility-Administered Medications  Medication Dose Route Frequency Provider Last Rate Last Dose  . acetaminophen (TYLENOL) tablet 650 mg  650 mg Oral Q6H PRN Clapacs, Madie Reno, MD   650 mg at 02/01/17 0636  . alum & mag hydroxide-simeth (MAALOX/MYLANTA) 200-200-20 MG/5ML suspension 30 mL  30 mL Oral Q4H PRN Clapacs, John T, MD      . cloZAPine (CLOZARIL) tablet 150 mg  150 mg Oral BID Ramond Dial, MD   150 mg at 02/01/17 0740  . diphenhydrAMINE (BENADRYL) capsule 50 mg  50 mg Oral Q6H PRN Bethel Gaglio B, MD   50 mg at 02/01/17 0636   Or  . diphenhydrAMINE (BENADRYL) injection 50 mg  50 mg Intramuscular Q6H PRN Shequila Neglia B, MD   50 mg at 01/31/17 2140  . divalproex (DEPAKOTE) DR tablet 500 mg  500 mg Oral Q12H Ronin Rehfeldt B, MD      . famotidine (PEPCID) tablet 20 mg  20 mg Oral BID Clapacs, John T, MD   20 mg at 01/31/17 0837  . haloperidol (HALDOL) tablet 10 mg  10 mg Oral Q6H PRN Kellyn Mccary B, MD   10 mg at 02/01/17 0636   Or  . haloperidol lactate (HALDOL) injection 10 mg  10 mg Intramuscular Q6H PRN Linder Prajapati B, MD   10 mg at 01/31/17 2142  . LORazepam (ATIVAN) tablet 2 mg  2 mg Oral Q6H PRN Waddell Iten B, MD        Or  . LORazepam (ATIVAN) injection 2 mg  2 mg Intramuscular Q6H PRN Elfriede Bonini B, MD   2 mg at 02/01/17 0738  . magnesium hydroxide (MILK OF MAGNESIA) suspension 30 mL  30 mL Oral Daily PRN Clapacs, John T, MD      . metoprolol succinate (TOPROL-XL) 24 hr tablet 25 mg  25 mg Oral Daily Clapacs, Madie Reno, MD   25 mg at 02/01/17 0740  . perphenazine (TRILAFON) tablet 4 mg  4 mg Oral BID Ramond Dial, MD   4 mg at 02/01/17 0740  . temazepam (RESTORIL) capsule 30 mg  30 mg Oral QHS Tacori Kvamme B, MD   30 mg at 01/31/17 1950  . ziprasidone (GEODON) injection 10 mg  10 mg Intramuscular Q6H PRN Ramond Dial, MD   10 mg at 02/01/17 1308    Lab Results:  Results for orders placed or performed during the hospital encounter of 01/27/17 (from  the past 48 hour(s))  Urine Drug Screen, Qualitative ([facility] only)     Status: Abnormal   Collection Time: 01/31/17  4:15 AM  Result Value Ref Range   Tricyclic, Ur Screen NONE DETECTED NONE DETECTED   Amphetamines, Ur Screen NONE DETECTED NONE DETECTED   MDMA (Ecstasy)Ur Screen NONE DETECTED NONE DETECTED   Cocaine Metabolite,Ur [place] NONE DETECTED NONE DETECTED   Opiate, Ur Screen NONE DETECTED NONE DETECTED   Phencyclidine (PCP) Ur S NONE DETECTED NONE DETECTED   Cannabinoid 50 Ng, Ur [place] NONE DETECTED NONE DETECTED   Barbiturates, Ur Screen NONE DETECTED NONE DETECTED   Benzodiazepine, Ur Scrn POSITIVE (A) NONE DETECTED   Methadone Scn, Ur NONE DETECTED NONE DETECTED    Comment: (NOTE) 509  Tricyclics, urine               Cutoff 1000 ng/mL 200  Amphetamines, urine             Cutoff 1000 ng/mL 300  MDMA (Ecstasy), urine           Cutoff 500 ng/mL 400  Cocaine Metabolite, urine       Cutoff 300 ng/mL 500  Opiate, urine                   Cutoff 300 ng/mL 600  Phencyclidine (PCP), urine      Cutoff 25 ng/mL 700  Cannabinoid, urine              Cutoff 50 ng/mL 800  Barbiturates, urine             Cutoff 200 ng/mL 900  Benzodiazepine,  urine           Cutoff 200 ng/mL 1000 Methadone, urine                Cutoff 300 ng/mL 1100 1200 The urine drug screen provides only a preliminary, unconfirmed 1300 analytical test result and should not be used for non-medical 1400 purposes. Clinical consideration and professional judgment should 1500 be applied to any positive drug screen result due to possible 1600 interfering substances. A more specific alternate chemical method 1700 must be used in order to obtain a confirmed analytical result.  1800 Gas chromato graphy / mass spectrometry (GC/MS) is the preferred 1900 confirmatory method.     Blood Alcohol level:  Lab Results  Component Value Date   ETH <5 01/27/2017   ETH <5 32/67/1245    Metabolic Disorder Labs: Lab Results  Component Value Date   HGBA1C 4.4 (L) 01/28/2017   MPG 79.58 01/28/2017   No results found for: PROLACTIN Lab Results  Component Value Date   CHOL 113 01/28/2017   TRIG 203 (H) 01/28/2017   HDL 20 (L) 01/28/2017   CHOLHDL 5.7 01/28/2017   VLDL 41 (H) 01/28/2017   LDLCALC 52 01/28/2017    Physical Findings: AIMS:  , ,  ,  ,    CIWA:    COWS:     Musculoskeletal: Strength & Muscle Tone: within normal limits Gait & Station: normal Patient leans: N/A  Psychiatric Specialty Exam: Physical Exam  Nursing note and vitals reviewed. Psychiatric: His affect is angry, labile and inappropriate. His speech is rapid and/or pressured. He is agitated, aggressive and combative. Cognition and memory are normal. He expresses impulsivity and inappropriate judgment.    Review of Systems  Psychiatric/Behavioral:       Agitated, threatening behavior.  All other systems reviewed and are negative.   Blood pressure Marland Kitchen)  149/107, pulse (!) 119, temperature 99.6 F (37.6 C), temperature source Oral, resp. rate 18, height 5\' 9"  (1.753 m), weight 88.9 kg (196 lb), SpO2 96 %.Body mass index is 28.94 kg/m.  General Appearance: Fairly Groomed  Eye Contact:   Good  Speech:  Clear and Coherent  Volume:  Normal  Mood:  occasionally irritable  Affect:  Full Range  Thought Process:  Coherent  Orientation:  Full (Time, Place, and Person)  Thought Content:  Negative  Suicidal Thoughts:  No  Homicidal Thoughts:  No  Memory:  Negative  Judgement:  Impaired  Insight:  Fair  Psychomotor Activity:  Normal  Handed:    AIMS (if indicated):     Assets:  Chief Executive Officer Social Support  ADL's:  Intact  Cognition:  WNL  Sleep:  Number of Hours: 4     Observation Level/Precautions:  Continuous Observation  Laboratory:  NA  Psychotherapy:    Medications:    Consultations:    Discharge Concerns:    Estimated LOS:  Other:       Treatment Plan Summary: Daily contact with patient to assess and evaluate symptoms and progress in treatment and Medication management   Mr. Jenkinson is 51 year old gentleman with a history of schizoaffective disorder and multiple hospitalizations who came to the ER with complaints of hematuria. He appeared irritable and hostile in the ED, which was continued after admission to the unit.   1. Psychosis. We will increase Clozaril to 400 mg nightly and Trilafon 12 mg daily for psychosis. We restarted low-dose Depakote but he refuses. We will order Clozapine level when patient more cooperative or with his [place].  2. Agitation. The combination of Haldol, Ativan, and Benadryl is available as needed.  3. Hypertension. He is on metoprolol.  4. GERD. He is on Pepcid.   5. Insomnia. Restoril 30 mg is available.   6. Metabolic syndrome monitoring. Lipid panel, TSH, hemoglobin A1c are normal.  7. EKG. Sinus rhythm, QTc 424.  8. Disposition. He will likely return to his apartment. Follow-up with [facility].  Orson Slick, MD 02/01/2017, 2:10 PM

## 2017-02-01 NOTE — Progress Notes (Addendum)
Pt continues to be verbally aggressive yelling profanities to staff. When going to give pt meds, he slapped at writer's hand and began cursing, pt began yelling, "You fuckin bitch, you are a fuckin man, continuing to be aggressive. Due to pt being physically and verbally aggressive. Three Psychologist, educational and nurse entered pt's room. Pt was given Benadryl 50mg , haldol 10 mg, and ativan 2 mg injection to left deltoid as per prn order. Pt refused his Depakote 500 mg and Temazepam 30 mg tab po.

## 2017-02-01 NOTE — [facility] Group Notes (Signed)
[facility] LCSW Group Therapy Note  Date/Time Sept 5th from 9:30-10:15 Type of Therapy/Topic: Group Therapy: Emotion Regulation  Participation Level: Patient was invited to attend but did not participate   Shena Vinluan LCSW 

## 2017-02-01 NOTE — Progress Notes (Signed)
Patient requesting to watch tv.

## 2017-02-01 NOTE — Progress Notes (Signed)
Up to use the bathroom, came out to the hallway to chat, no temper tantrums, no behavioral problems now, remains on 1:1 Air cabin crew and with [employer]; patient encouraged to go back to sleep.

## 2017-02-01 NOTE — Progress Notes (Signed)
Unable to go back to sleep, up and talking, socializing with 1:1 Magazine features editor. No aggressive behaviors.

## 2017-02-01 NOTE — Progress Notes (Signed)
Patient up on hallway asking for milk.  He is appropriate with staff but still has loud voice. He goes back to his room. Continues to be 1:1 with Environmental consultant.

## 2017-02-01 NOTE — Plan of Care (Signed)
Problem: Activity: Goal: Sleeping patterns will improve Outcome: Progressing Patient slept for Estimated Hours of 4; 1:1 Safety Sitter and an Officer maintained, no aggression now.

## 2017-02-01 NOTE — Progress Notes (Signed)
Patient up to use bathroom. Denies pain. States, " i'm shitting."

## 2017-02-02 MED ORDER — HYDRALAZINE HCL 50 MG PO TABS
50.0000 mg | ORAL_TABLET | Freq: Once | ORAL | Status: AC
  Administered 2017-02-02: 50 mg via ORAL
  Filled 2017-02-02: qty 1

## 2017-02-02 MED ORDER — CLONIDINE HCL 0.1 MG PO TABS
0.1000 mg | ORAL_TABLET | Freq: Once | ORAL | Status: AC
  Administered 2017-02-02: 0.1 mg via ORAL
  Filled 2017-02-02: qty 1

## 2017-02-02 NOTE — Progress Notes (Signed)
Patient sitting in dayroom within the contained and structured area with sitter and Animal nutritionist. Patient laughing and communicating with staff. Patient's BP elevated this shift. Medication administered per MD order. Will continue to monitor.

## 2017-02-02 NOTE — Progress Notes (Signed)
Pt up sitting in chair in hall talking to staff. 1:1 continues, sitter and security in place.  No s/s of distress noted.Pt requested juice to drink, pt was given an apple juice. Pt behavior is appropriate at this time. Pt isn't yelling or screaming at the present time. Hourly nursing checks continue. 15 min safety checks continue

## 2017-02-02 NOTE — Progress Notes (Signed)
Patient continues to turn pages: polite, quiet, but remains highly unpredictable, remains on 1:1 Sitter Case with Animal nutritionist in contained and structured milieu.

## 2017-02-02 NOTE — [facility] Group Notes (Signed)
[facility] LCSW Group Therapy Note  Date/Time  September 6th,2018 9:30- to 10:15  Type of Therapy/Topic:  Group Therapy:  Balance in Life  Participation Level: Patient  was invited to attend therapuetic group and chose not to attend  Jaylena Holloway LCSW 

## 2017-02-02 NOTE — Progress Notes (Signed)
Pt resting in bed with eyes closed, no distress noted. 1:1 continues, sitter in place. 15 min safety checks continues. Hourly nursing rounds continue.

## 2017-02-02 NOTE — Progress Notes (Signed)
Pt in bed resting in bed with eyes closed. 1:1 continues, sitter in place. No distress noted. Hourly nursing rounds continue. 15 min safety checks continue.

## 2017-02-02 NOTE — [facility] Group Notes (Signed)
[place] Group Notes:  (Nursing/MHT/Case Management/Adjunct)  Date:  02/02/2017  Time:  9:17 PM  Type of Therapy:  Group Therapy  Participation Level:  Did Not Attend  Participation Quality: Summary of Progress/Problems:  Nicholas Cole 02/02/2017, 9:17 PM

## 2017-02-02 NOTE — Progress Notes (Signed)
Pt sitting in tv room on [place] hall. Sitter and security in place. No distress noted. Pt is talking to staff. 1:1 continues, hourly nursing checks, and 15 safety checks continue. No aggressive behaviors noted at this time.

## 2017-02-02 NOTE — [facility] Group Notes (Signed)
[facility] Group Notes:  (Nursing/MHT/Case Management/Adjunct)  Date:  02/02/2017  Time:  2:01 PM  Type of Therapy:  Psychoeducational Skills  Participation Level:  Did Not Attend   Alois Cliche 02/02/2017, 2:01 PM

## 2017-02-02 NOTE — Progress Notes (Signed)
Patient sitting in dayroom with 1:1 sitter and security officer watching T.V. No aggression exhibited at this time. BP rechecked: 144/100 P: 94. Safety checks performed by staff.

## 2017-02-02 NOTE — Progress Notes (Signed)
1:1 continues, sitter in place. Pt resting with eyes closed, no distress noted. 15 min safety checks continue. Hourly nursing rounds continue.

## 2017-02-02 NOTE — Progress Notes (Signed)
Morning medications and Benadryl/Haldol/Ativan combination administered to patient due to increase in agitation. Patient ate his breakfast and communicated with 1:1 staff and Animal nutritionist. Will continue to monitor.

## 2017-02-02 NOTE — Progress Notes (Signed)
Patient lying in bed with eyes closed and audible snoring, Respirations even and unlabored. Leisure centre manager in place within the contained and structured environment.

## 2017-02-02 NOTE — Progress Notes (Signed)
Nutrition Brief Note  Received consult requesting all plastic and styrofoam containers for this pt. Contacted diet office, made them aware and placed several notes in [facility] to only send styrofoam.   Koleen Distance MS, RD, LDN Pager #[phone] After Hours Pager: [phone]

## 2017-02-02 NOTE — Progress Notes (Signed)
[facility] met with patient at his request. Patient was eating lunch and stated it was fine to stay and talk. Patient stated that he is angry and that he has 2 different sides. Patient stated that he had kidney stones and that something had manifested itself in his stomach and he thought that it needed to be removed. Patient spoke about his religious beliefs and how he wanted to be discharged to go home to his apartment. Patient was polite and spoke loudly; but did not show signs of anger or aggression toward me. Patient has complaints that he would like to share with the director.   02/02/17 1125  Clinical Encounter Type  Visited With Patient;Health care provider;Other (Comment)  Visit Type Initial;Spiritual support  Referral From Patient  Spiritual Encounters  Spiritual Needs Emotional

## 2017-02-02 NOTE — Progress Notes (Signed)
Patient in the shower at this time, this Probation officer overheard patient singing. 1:1 Leisure centre manager within the contained and structured environment., pleasant and cooperative at this time.

## 2017-02-02 NOTE — Progress Notes (Signed)
Not barbaric this evening, he also took Depakote 500 mg, remains on watch continuously on 1:1 Conservation officer, historic buildings and watching TV.

## 2017-02-02 NOTE — Progress Notes (Signed)
Patient up in hallway yelling and beating on the hallway door. Patient remains in a contained and structured area. Very irritated, Programmer, systems in place. 15 minute checks done.

## 2017-02-02 NOTE — Progress Notes (Signed)
Patient continues to turn pages: polite, quiet, appropriate now, but remains highly unpredictable, remains on 1:1 Sitter Case with Animal nutritionist in contained and structured milieu.

## 2017-02-02 NOTE — Progress Notes (Signed)
Patient remains in an contained and structured environment, no agitation nor hostility displayed at this time. Observed watching TV in the day room with 1:1 Leisure centre manager.

## 2017-02-02 NOTE — Progress Notes (Signed)
[facility] MD Progress Note  02/02/2017 8:22 AM Garyn Arlotta  MRN:  [id number] Subjective:    01/30/2017. He endorses that he is doing well. He is interactive with other patients on the unit. Denies any auditory or visual hallucinations. Says he had a good night's rest and slept fine.  Gets occasionally irritable with nursing staff, situations being completely unprovoked. Reported that he does not want to take the Depakote.  Care Everywhere documents that he had hyperammonemia with Depakote. Lithium was also tried but he developed lithium toxicity with renal failure. Current home medications include perphenazine, Klonopin and clozapine. There is some degree of discrepancy in the dosing of Klonopin which varies from 0.5 mg daily to 0.5 mg 3 times daily. However he would like about consolidated at night and so his nighttime dosing is currently 1.5 mg at bedtime. Was also worked up for myeloma in the past, considering he had elevated parathyroid hormone levels and abnormal immunoglobulin levels. Serum protein electrophoresis was unremarkable at [facility] nephrology.  01/31/2017. Mr. Hubers has been agitated and threatening since yesterday, including multiple episodes last night. He did not participate in treatment team meeting this morning. I spoke with him during lunch which he took on the back hall with security and sitter. He was pleasant. He recognized me from previous admissions. He reported living independently for the past 7 months. It is likely that he discontinued medications a while ago. His Depakote level was low on admission but it is unclear if he was prescribed Depakote recently. He has a history of hyperammonemia on Depakote. Clozaril level was not checked on admission. Shortly after my interview, the patient started yelling screening banging on the door and received IM injection of Geodon. I will ask Dr. Weber Cooks for a second opinion and order NEFM.  02/01/2017. Mr. Torpey is still agitated, threatening and  grandiose today. 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 He was given injection of haldol and benadrly this morning. He takes his meals with no problem. He has 1:1 sitter and security guard on the back hall. He slept 4 hours only with 30 mg of Restoril. He refuses Depakote. There is a history of hyperammonemia on Depakote. There is also history of Lithium toxicity.   02/02/2017. Mr. Clouse remains on back hall with 1:1 sitter and security guard. He is agitated and threatening. We will order styrofoam food containers as there is a risk of him throwing food tray at the staff. He has no insight into mental illness and demands immediate discharge as there is nothing wrong with him. He refuses Restoril and Depakote and stays up all night. He does accept Clozapine and Trilafon. He complains botterly about people "talking loudly all the time" but denies they are his voices.  Per nursing: Pt continues to be verbally aggressive yelling profanities to staff. When going to give pt meds, he slapped at writer's hand and began cursing, pt began yelling, "You fuckin bitch, you are a fuckin man, continuing to be aggressive. Due to pt being physically and verbally aggressive. Three Psychologist, educational and nurse entered pt's room. Pt was given Benadryl 50mg , haldol 10 mg, and ativan 2 mg injection to left deltoid as per prn order. Pt refused his Depakote 500 mg and Temazepam 30 mg tab po.  Principal Problem: Schizoaffective disorder, bipolar type ([place]) Diagnosis:   Patient Active Problem List   Diagnosis Date Noted  . GERD (gastroesophageal reflux disease) [K21.9] 01/30/2017  . Chronic renal failure [N18.9] 01/27/2017  . Hypertension [I10] 01/27/2017  .  Lithium toxicity [T56.891A] 11/06/2016  . ARF (acute renal failure) ([place]) [N17.9] 11/05/2016  . Dehydration [E86.0] 11/05/2016  . Rhabdomyolysis [M62.82] 11/05/2016  . Schizoaffective disorder, bipolar type ([place]) [F25.0] 11/05/2016   Total Time spent with patient: 30 minutes  Past  Psychiatric History: Long-standing mental health problems diagnosis schizoaffective disorder. Multiple hospitalizations including state hospitalizations. No history that I know of suicide attempts. Has been threatening and aggressive at times in the past. Patient is on clozapine and Depakote. I am not sure whether lithium is still being used given his worsening renal function.  Past Medical History:  Past Medical History:  Diagnosis Date  . Acute renal failure (ARF) ([place])   . Schizophrenia ([place])    History reviewed. No pertinent surgical history. Family History: History reviewed. No pertinent family history. Family Psychiatric  History:  Social History:  History  Alcohol Use No     History  Drug Use No    Social History   Social History  . Marital status: Single    Spouse name: N/A  . Number of children: N/A  . Years of education: N/A   Social History Main Topics  . Smoking status: Former Research scientist (life sciences)  . Smokeless tobacco: Never Used  . Alcohol use No  . Drug use: No  . Sexual activity: Not Asked   Other Topics Concern  . None   Social History Narrative  . None   Additional Social History:    Lives in an apartment in [place]. Endorses that his siblings are alive, but he does not keep in touch with them. Spends his day at the park at times. Says he gets his SSI check as weekly payments.  Denies any substance use currently.       Sleep: Good  Appetite:  Good  Current Medications: Current Facility-Administered Medications  Medication Dose Route Frequency Provider Last Rate Last Dose  . acetaminophen (TYLENOL) tablet 650 mg  650 mg Oral Q6H PRN Clapacs, Madie Reno, MD   650 mg at 02/01/17 0636  . alum & mag hydroxide-simeth (MAALOX/MYLANTA) 200-200-20 MG/5ML suspension 30 mL  30 mL Oral Q4H PRN Clapacs, John T, MD      . cloZAPine (CLOZARIL) tablet 200 mg  200 mg Oral BID Ilean Spradlin B, MD   200 mg at 02/02/17 0729  . diphenhydrAMINE (BENADRYL) capsule 50 mg  50 mg  Oral Q6H PRN Aritza Brunet B, MD   50 mg at 02/01/17 0636   Or  . diphenhydrAMINE (BENADRYL) injection 50 mg  50 mg Intramuscular Q6H PRN Terralyn Matsumura B, MD   50 mg at 02/02/17 0736  . divalproex (DEPAKOTE) DR tablet 500 mg  500 mg Oral Q12H Hodan Wurtz B, MD      . famotidine (PEPCID) tablet 20 mg  20 mg Oral BID Clapacs, John T, MD   20 mg at 02/02/17 0748  . haloperidol (HALDOL) tablet 10 mg  10 mg Oral Q6H PRN Asaiah Hunnicutt B, MD   10 mg at 02/01/17 0636   Or  . haloperidol lactate (HALDOL) injection 10 mg  10 mg Intramuscular Q6H PRN Maurisa Tesmer B, MD   10 mg at 02/02/17 0733  . LORazepam (ATIVAN) tablet 2 mg  2 mg Oral Q6H PRN Shikita Vaillancourt B, MD       Or  . LORazepam (ATIVAN) injection 2 mg  2 mg Intramuscular Q6H PRN Toini Failla B, MD   2 mg at 02/02/17 0732  . magnesium hydroxide (MILK OF MAGNESIA) suspension 30 mL  30 mL Oral Daily PRN Clapacs, John T, MD      . metoprolol succinate (TOPROL-XL) 24 hr tablet 25 mg  25 mg Oral Daily Clapacs, Madie Reno, MD   25 mg at 02/02/17 0749  . perphenazine (TRILAFON) tablet 8 mg  8 mg Oral BID Chantae Soo B, MD   8 mg at 02/02/17 0729  . temazepam (RESTORIL) capsule 30 mg  30 mg Oral QHS Tiajah Oyster B, MD   30 mg at 01/31/17 1950  . ziprasidone (GEODON) injection 10 mg  10 mg Intramuscular Q6H PRN Ramond Dial, MD   10 mg at 02/01/17 1525    Lab Results:  No results found for this or any previous visit (from the past 66 hour(s)).  Blood Alcohol level:  Lab Results  Component Value Date   ETH <5 01/27/2017   ETH <5 37/90/2409    Metabolic Disorder Labs: Lab Results  Component Value Date   HGBA1C 4.4 (L) 01/28/2017   MPG 79.58 01/28/2017   No results found for: PROLACTIN Lab Results  Component Value Date   CHOL 113 01/28/2017   TRIG 203 (H) 01/28/2017   HDL 20 (L) 01/28/2017   CHOLHDL 5.7 01/28/2017   VLDL 41 (H) 01/28/2017   LDLCALC 52 01/28/2017    Physical  Findings: AIMS:  , ,  ,  ,    CIWA:    COWS:     Musculoskeletal: Strength & Muscle Tone: within normal limits Gait & Station: normal Patient leans: N/A  Psychiatric Specialty Exam: Physical Exam  Nursing note and vitals reviewed. Psychiatric: His affect is angry, labile and inappropriate. His speech is rapid and/or pressured. He is agitated, aggressive and combative. Cognition and memory are normal. He expresses impulsivity and inappropriate judgment.    Review of Systems  Psychiatric/Behavioral:       Agitated, threatening behavior.  All other systems reviewed and are negative.   Blood pressure (!) 152/118, pulse 89, temperature 99.6 F (37.6 C), temperature source Oral, resp. rate 18, height 5\' 9"  (1.753 m), weight 88.9 kg (196 lb), SpO2 96 %.Body mass index is 28.94 kg/m.  General Appearance: Fairly Groomed  Eye Contact:  Good  Speech:  Clear and Coherent  Volume:  Normal  Mood:  occasionally irritable  Affect:  Full Range  Thought Process:  Coherent  Orientation:  Full (Time, Place, and Person)  Thought Content:  Negative  Suicidal Thoughts:  No  Homicidal Thoughts:  No  Memory:  Negative  Judgement:  Impaired  Insight:  Fair  Psychomotor Activity:  Normal  Handed:    AIMS (if indicated):     Assets:  Chief Executive Officer Social Support  ADL's:  Intact  Cognition:  WNL  Sleep:  Number of Hours: 5.45     Observation Level/Precautions:  Continuous Observation  Laboratory:  NA  Psychotherapy:    Medications:    Consultations:    Discharge Concerns:    Estimated LOS:  Other:       Treatment Plan Summary: Daily contact with patient to assess and evaluate symptoms and progress in treatment and Medication management   Mr. Lina is 51 year old gentleman with a history of schizoaffective disorder and multiple hospitalizations who came to the ER with complaints of hematuria. He continues to be agitated and threatening. He stays on back hall with 1:1  sitter and security guard.   1. Psychosis. We increased Clozaril to 400 mg and Trilafon 16 mg daily for psychosis. We restarted low-dose Depakote but he  refuses. We will order Clozapine level when patient more cooperative or with his [place].  2. Agitation. The combination of Haldol, Ativan, and Benadryl is available as needed. Also Geodon. Styrofoam containers were ordered for safety.   3. Hypertension. He is on metoprolol.  4. GERD. He is on Pepcid.   5. Insomnia. Restoril 30 mg is available.   6. Metabolic syndrome monitoring. Lipid panel, TSH, hemoglobin A1c are normal.  7. EKG. Sinus rhythm, QTc 424.  8. Disposition. He has been in the hospital for 6 days. We will start [place] application. He will likely return to his apartment. Follow-up with [facility].  Orson Slick, MD 02/02/2017, 8:22 AM

## 2017-02-02 NOTE — Progress Notes (Signed)
Patient showing signs of agitation, yelling at this writer stating, "Go get me so coffee, who the hell do you think you are telling me not to drink too much coffee, I don't care if my blood pressure is high, I will drink it if I want to.!" Patient began pacing the hallway speaking loudly in the direction of the nurse, (within the contained and structured setting). 1:1 Leisure centre manager in place on the unit with the patient. PRN medications administered to patient po for agitation. Will continue to monitor.

## 2017-02-02 NOTE — Progress Notes (Signed)
Patient visible in the green hallway within the contained and structured area. 1:1 staff and Animal nutritionist in place. Patient calm at this time.

## 2017-02-02 NOTE — [facility] Group Notes (Signed)
Goals Group Date/Time:September 5LR,1740 9-9:30 Type of Therapy and Topic: Group Therapy: Goals Group: SMART Goals   Participation Level: Patient did not participate patient was remined and was invited to attend group by nurse tech.   Remedios Mckone LCSW

## 2017-02-02 NOTE — Progress Notes (Signed)
Patient sitting calmly in the dayroom watching TV with 1:1 sitter and security officer within the contained and structured milieu. No agitation being displayed at this time.

## 2017-02-02 NOTE — [facility] Suicide Risk Assessment (Signed)
[place] INPATIENT:  Family/Significant Other Suicide Prevention Education  Suicide Prevention Education:  Contact Attempts: Vance Gather, [phone], has been identified by the patient as the family member/significant other with whom the patient will be residing, and identified as the person(s) who will aid the patient in the event of a mental health crisis.  With written consent from the patient, two attempts were made to provide suicide prevention education, prior to and/or following the patient's discharge.  We were unsuccessful in providing suicide prevention education.  A suicide education pamphlet was given to the patient to share with family/significant other.  Date and time of first attempt:02/02/17, 1532 Date and time of second attempt:  Joanne Chars, LCSW 02/02/2017, 3:32 PM

## 2017-02-02 NOTE — Progress Notes (Signed)
Patient up in green dayroom watching  TV, not agitated at this time. 1:1 Leisure centre manager within arms reach of the patient.

## 2017-02-02 NOTE — Plan of Care (Signed)
Problem: Education: Goal: Will be free of psychotic symptoms Outcome: Not Progressing Patient continues to exhibit moment of aggression and hostile behavior.   Problem: Coping: Goal: Ability to cope will improve Outcome: Not Progressing Patient can not cope with his current situation, easily becomes anxious and agitated. Goal: Ability to verbalize feelings will improve Outcome: Not Progressing Patient gets upset and yells when he needs things to be done for him, he does not communicate appropriately

## 2017-02-02 NOTE — Progress Notes (Signed)
Patient eating lunch in the green day room at this time, per MD request, his meal is now being served on styrofoam products for safety reasons. Meal consumes by patient, 1:1 Leisure centre manager remains in place within the contained and structured environment.

## 2017-02-03 LAB — CBC WITH DIFFERENTIAL/PLATELET
BASOS PCT: 1 %
Basophils Absolute: 0 10*3/uL (ref 0–0.1)
EOS ABS: 0.1 10*3/uL (ref 0–0.7)
Eosinophils Relative: 3 %
HCT: 38 % — ABNORMAL LOW (ref 40.0–52.0)
HEMOGLOBIN: 12.6 g/dL — AB (ref 13.0–18.0)
LYMPHS ABS: 1.5 10*3/uL (ref 1.0–3.6)
Lymphocytes Relative: 41 %
MCH: 29 pg (ref 26.0–34.0)
MCHC: 33.1 g/dL (ref 32.0–36.0)
MCV: 87.5 fL (ref 80.0–100.0)
Monocytes Absolute: 0.4 10*3/uL (ref 0.2–1.0)
Monocytes Relative: 10 %
NEUTROS PCT: 45 %
Neutro Abs: 1.7 10*3/uL (ref 1.4–6.5)
Platelets: 156 10*3/uL (ref 150–440)
RBC: 4.34 MIL/uL — AB (ref 4.40–5.90)
RDW: 13.1 % (ref 11.5–14.5)
WBC: 3.7 10*3/uL — AB (ref 3.8–10.6)

## 2017-02-03 MED ORDER — METOPROLOL SUCCINATE ER 25 MG PO TB24
50.0000 mg | ORAL_TABLET | Freq: Every day | ORAL | Status: DC
  Administered 2017-02-04: 50 mg via ORAL
  Filled 2017-02-03: qty 2

## 2017-02-03 NOTE — Progress Notes (Signed)
Sitter 1:1, safety maintained.Pt in dayroom hyperverbal interacting with staff.

## 2017-02-03 NOTE — Progress Notes (Signed)
Patient in bed sleeping, remains on 1:1 Magazine features editor watching.

## 2017-02-03 NOTE — Progress Notes (Signed)
On initial shift rounds, Patient was observed with sitter.  He had rapid speech and behavior was demanding and restless.  He focused on which medications he "would take and would not take".  He had difficulty focusing on television show although he insisted on watching a certain show.  He was distracted by staff he could see in the nurse's station and banged on the door for attention.  He easily became defiant when asked to change behavior and not pound on the door.  Coffee was taken to him per his request.

## 2017-02-03 NOTE — Progress Notes (Signed)
Slept almost until 0645, agreed with am labs and VS, remains on 1:1 Air cabin crew and a Animal nutritionist, behaviors: mood and affect pleasant and appropriate, polite, jovial; no yelling out loud, no anger or temper tantrums, no behavioral problems.

## 2017-02-03 NOTE — Progress Notes (Signed)
Patient was observed coming to the locked door and yelling for his nurse.  He was redirected and verbalized "I am sorry"  But then demanded "more food".  He was given crackers and milk and reminded that others are sleeping and asked to stay away from the door.  He was asked to let sitter Microbiologist with his requests.  He then started complaining about writer's redirection.

## 2017-02-03 NOTE — Progress Notes (Signed)
[facility] MD Progress Note  02/03/2017 8:47 AM Nicholas Cole  MRN:  [id number] Subjective:    01/30/2017. He endorses that he is doing well. He is interactive with other patients on the unit. Denies any auditory or visual hallucinations. Says he had a good night's rest and slept fine.  Gets occasionally irritable with nursing staff, situations being completely unprovoked. Reported that he does not want to take the Depakote.  Care Everywhere documents that he had hyperammonemia with Depakote. Lithium was also tried but he developed lithium toxicity with renal failure. Current home medications include perphenazine, Klonopin and clozapine. There is some degree of discrepancy in the dosing of Klonopin which varies from 0.5 mg daily to 0.5 mg 3 times daily. However he would like about consolidated at night and so his nighttime dosing is currently 1.5 mg at bedtime. Was also worked up for myeloma in the past, considering he had elevated parathyroid hormone levels and abnormal immunoglobulin levels. Serum protein electrophoresis was unremarkable at [facility] nephrology.  01/31/2017. Nicholas Cole has been agitated and threatening since yesterday, including multiple episodes last night. He did not participate in treatment team meeting this morning. I spoke with him during lunch which he took on the back hall with security and sitter. He was pleasant. He recognized me from previous admissions. He reported living independently for the past 7 months. It is likely that he discontinued medications a while ago. His Depakote level was low on admission but it is unclear if he was prescribed Depakote recently. He has a history of hyperammonemia on Depakote. Clozaril level was not checked on admission. Shortly after my interview, the patient started yelling screening banging on the door and received IM injection of Geodon. I will ask Dr. Weber Cooks for a second opinion and order NEFM.  02/01/2017. Nicholas Cole is still agitated, threatening and  grandiose today. 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 He was given injection of haldol and benadrly this morning. He takes his meals with no problem. He has 1:1 sitter and security guard on the back hall. He slept 4 hours only with 30 mg of Restoril. He refuses Depakote. There is a history of hyperammonemia on Depakote. There is also history of Lithium toxicity.   02/02/2017. Nicholas Cole remains on back hall with 1:1 sitter and security guard. He is agitated and threatening. We will order styrofoam food containers as there is a risk of him throwing food tray at the staff. He has no insight into mental illness and demands immediate discharge as there is nothing wrong with him. He refuses Restoril and Depakote and stays up all night. He does accept Clozapine and Trilafon. He complains botterly about people "talking loudly all the time" but denies they are his voices.  02/03/2017. Nicholas Cole seems much improved today. He is still on the back hall with one-to-one sitter and security guard but has no behavioral problems today. He slept over 8 hours last night for the first time. He takes clozapine and Trilafon and continues to refuse Depakote.  he is still without and hyperverbal but able to hold and not been physically detailed conversation. He understands that he needs to maintain good behavior to be released from the back hall and have a visit from his ACT team before discharge plans can be made. His blood pressure was elevated yesterday and he received clonidine and Hydralazine. He ate his lunch today. He has no somatic complaints. He denies hallucinations.   Per nursing: Patient showing signs of agitation, yelling at  this Probation officer stating, "Go get me so coffee, who the hell do you think you are telling me not to drink too much coffee, I don't care if my blood pressure is high, I will drink it if I want to.!" Patient began pacing the hallway speaking loudly in the direction of the nurse, (within the contained and structured  setting). 1:1 Leisure centre manager in place on the unit with the patient. PRN medications administered to patient po for agitation. Will continue to monitor.  Principal Problem: Schizoaffective disorder, bipolar type ([place]) Diagnosis:   Patient Active Problem List   Diagnosis Date Noted  . GERD (gastroesophageal reflux disease) [K21.9] 01/30/2017  . Chronic renal failure [N18.9] 01/27/2017  . Hypertension [I10] 01/27/2017  . Lithium toxicity [T56.891A] 11/06/2016  . ARF (acute renal failure) ([place]) [N17.9] 11/05/2016  . Dehydration [E86.0] 11/05/2016  . Rhabdomyolysis [M62.82] 11/05/2016  . Schizoaffective disorder, bipolar type ([place]) [F25.0] 11/05/2016   Total Time spent with patient: 30 minutes  Past Psychiatric History: Long-standing mental health problems diagnosis schizoaffective disorder. Multiple hospitalizations including state hospitalizations. No history that I know of suicide attempts. Has been threatening and aggressive at times in the past. Patient is on clozapine and Depakote. I am not sure whether lithium is still being used given his worsening renal function.  Past Medical History:  Past Medical History:  Diagnosis Date  . Acute renal failure (ARF) ([place])   . Schizophrenia ([place])    History reviewed. No pertinent surgical history. Family History: History reviewed. No pertinent family history. Family Psychiatric  History:  Social History:  History  Alcohol Use No     History  Drug Use No    Social History   Social History  . Marital status: Single    Spouse name: N/A  . Number of children: N/A  . Years of education: N/A   Social History Main Topics  . Smoking status: Former Research scientist (life sciences)  . Smokeless tobacco: Never Used  . Alcohol use No  . Drug use: No  . Sexual activity: Not Asked   Other Topics Concern  . None   Social History Narrative  . None   Additional Social History:    Lives in an apartment in [place]. Endorses that his siblings are  alive, but he does not keep in touch with them. Spends his day at the park at times. Says he gets his SSI check as weekly payments.  Denies any substance use currently.       Sleep: Good  Appetite:  Good  Current Medications: Current Facility-Administered Medications  Medication Dose Route Frequency Provider Last Rate Last Dose  . acetaminophen (TYLENOL) tablet 650 mg  650 mg Oral Q6H PRN Clapacs, Madie Reno, MD   650 mg at 02/01/17 0636  . alum & mag hydroxide-simeth (MAALOX/MYLANTA) 200-200-20 MG/5ML suspension 30 mL  30 mL Oral Q4H PRN Clapacs, John T, MD      . cloZAPine (CLOZARIL) tablet 200 mg  200 mg Oral BID Troi Bechtold B, MD   200 mg at 02/03/17 0800  . diphenhydrAMINE (BENADRYL) capsule 50 mg  50 mg Oral Q6H PRN Arabell Neria B, MD   50 mg at 02/02/17 1641   Or  . diphenhydrAMINE (BENADRYL) injection 50 mg  50 mg Intramuscular Q6H PRN Anorah Trias B, MD   50 mg at 02/02/17 0736  . divalproex (DEPAKOTE) DR tablet 500 mg  500 mg Oral Q12H Laveyah Oriol B, MD   500 mg at 02/02/17 1947  . famotidine (  PEPCID) tablet 20 mg  20 mg Oral BID Clapacs, John T, MD   20 mg at 02/03/17 0800  . haloperidol (HALDOL) tablet 10 mg  10 mg Oral Q6H PRN Jaylyn Iyer B, MD   10 mg at 02/02/17 1641   Or  . haloperidol lactate (HALDOL) injection 10 mg  10 mg Intramuscular Q6H PRN Orah Sonnen B, MD   10 mg at 02/02/17 0733  . LORazepam (ATIVAN) tablet 2 mg  2 mg Oral Q6H PRN Ceniyah Thorp B, MD   2 mg at 02/02/17 1641   Or  . LORazepam (ATIVAN) injection 2 mg  2 mg Intramuscular Q6H PRN [place] Latulippe B, MD   2 mg at 02/02/17 0732  . magnesium hydroxide (MILK OF MAGNESIA) suspension 30 mL  30 mL Oral Daily PRN Clapacs, John T, MD      . metoprolol succinate (TOPROL-XL) 24 hr tablet 25 mg  25 mg Oral Daily Clapacs, John T, MD   25 mg at 02/03/17 0800  . perphenazine (TRILAFON) tablet 8 mg  8 mg Oral BID Kennita Pavlovich B, MD   8 mg at 02/03/17 0800   . temazepam (RESTORIL) capsule 30 mg  30 mg Oral QHS Letasha Kershaw B, MD   30 mg at 02/02/17 2301  . ziprasidone (GEODON) injection 10 mg  10 mg Intramuscular Q6H PRN Ramond Dial, MD   10 mg at 02/01/17 1525    Lab Results:  Results for orders placed or performed during the hospital encounter of 01/27/17 (from the past 48 hour(s))  CBC with Differential/Platelet     Status: Abnormal   Collection Time: 02/03/17  6:52 AM  Result Value Ref Range   WBC 3.7 (L) 3.8 - 10.6 K/uL   RBC 4.34 (L) 4.40 - 5.90 MIL/uL   Hemoglobin 12.6 (L) 13.0 - 18.0 g/dL   HCT 38.0 (L) 40.0 - 52.0 %   MCV 87.5 80.0 - 100.0 fL   MCH 29.0 26.0 - 34.0 pg   MCHC 33.1 32.0 - 36.0 g/dL   RDW 13.1 11.5 - 14.5 %   Platelets 156 150 - 440 K/uL   Neutrophils Relative % 45 %   Neutro Abs 1.7 1.4 - 6.5 K/uL   Lymphocytes Relative 41 %   Lymphs Abs 1.5 1.0 - 3.6 K/uL   Monocytes Relative 10 %   Monocytes Absolute 0.4 0.2 - 1.0 K/uL   Eosinophils Relative 3 %   Eosinophils Absolute 0.1 0 - 0.7 K/uL   Basophils Relative 1 %   Basophils Absolute 0.0 0 - 0.1 K/uL    Blood Alcohol level:  Lab Results  Component Value Date   ETH <5 01/27/2017   ETH <5 16/02/9603    Metabolic Disorder Labs: Lab Results  Component Value Date   HGBA1C 4.4 (L) 01/28/2017   MPG 79.58 01/28/2017   No results found for: PROLACTIN Lab Results  Component Value Date   CHOL 113 01/28/2017   TRIG 203 (H) 01/28/2017   HDL 20 (L) 01/28/2017   CHOLHDL 5.7 01/28/2017   VLDL 41 (H) 01/28/2017   LDLCALC 52 01/28/2017    Physical Findings: AIMS:  , ,  ,  ,    CIWA:    COWS:     Musculoskeletal: Strength & Muscle Tone: within normal limits Gait & Station: normal Patient leans: N/A  Psychiatric Specialty Exam: Physical Exam  Nursing note and vitals reviewed. Psychiatric: His affect is angry, labile and inappropriate. His speech is rapid and/or pressured. He  is agitated, aggressive and combative. Cognition and memory  are normal. He expresses impulsivity and inappropriate judgment.    Review of Systems  Psychiatric/Behavioral:       Agitated, threatening behavior.  All other systems reviewed and are negative.   Blood pressure (!) 151/101, pulse (!) 101, temperature 98.1 F (36.7 C), temperature source Oral, resp. rate 20, height 5\' 9"  (1.753 m), weight 88.9 kg (196 lb), SpO2 96 %.Body mass index is 28.94 kg/m.  General Appearance: Fairly Groomed  Eye Contact:  Good  Speech:  Clear and Coherent  Volume:  Normal  Mood:  occasionally irritable  Affect:  Full Range  Thought Process:  Coherent  Orientation:  Full (Time, Place, and Person)  Thought Content:  Negative  Suicidal Thoughts:  No  Homicidal Thoughts:  No  Memory:  Negative  Judgement:  Impaired  Insight:  Fair  Psychomotor Activity:  Normal  Handed:    AIMS (if indicated):     Assets:  Chief Executive Officer Social Support  ADL's:  Intact  Cognition:  WNL  Sleep:  Number of Hours: 6.15     Observation Level/Precautions:  Continuous Observation  Laboratory:  NA  Psychotherapy:    Medications:    Consultations:    Discharge Concerns:    Estimated LOS:  Other:       Treatment Plan Summary: Daily contact with patient to assess and evaluate symptoms and progress in treatment and Medication management   Nicholas Cole is 51 year old gentleman with a history of schizoaffective disorder and multiple hospitalizations who came to the ER with complaints of hematuria. He continues to be agitated and threatening. He stays on back hall with 1:1 sitter and security guard.   1. Psychosis. We increased Clozaril to 400 mg and Trilafon 16 mg daily for psychosis. We restarted low-dose Depakote but he refuses. Clozapine level is pending.   2. Agitation. The combination of Haldol, Ativan, and Benadryl is available as needed. Also Geodon. Styrofoam containers were ordered for safety.   3. Hypertension. I will increase metoprolol. He is  tachycardic likely from clozapine.  4. GERD. He is on Pepcid.   5. Insomnia. Restoril 30 mg is available.   6. Metabolic syndrome monitoring. Lipid panel, TSH, hemoglobin A1c are normal.  7. EKG. Sinus rhythm, QTc 424.  8. Disposition. He has been in the hospital for 6 days. We will start [place] application. He will likely return to his apartment. Follow-up with [facility].  Orson Slick, MD 02/03/2017, 8:47 AM

## 2017-02-03 NOTE — [facility] Suicide Risk Assessment (Signed)
[facility] INPATIENT:  Family/Significant Other Suicide Prevention Education  Suicide Prevention Education:  Education Completed; Vance Gather (patient refers to her as his sister, however she is not biolgically related, friend from church  [phone]),  (name of family member/significant other) has been identified by the patient as the family member/significant other with whom the patient will be residing, and identified as the person(s) who will aid the patient in the event of a mental health crisis (suicidal ideations/suicide attempt).  With written consent from the patient, the family member/significant other has been provided the following suicide prevention education, prior to the and/or following the discharge of the patient.  The suicide prevention education provided includes the following:  Suicide risk factors  Suicide prevention and interventions  National Suicide Hotline telephone number  [facility] assessment telephone number  [facility] Emergency Assistance [place] and/or Residential Mobile Crisis Unit telephone number  Request made of family/significant other to:  Remove weapons (e.g., guns, rifles, knives), all items previously/currently identified as safety concern.    Remove drugs/medications (over-the-counter, prescriptions, illicit drugs), all items previously/currently identified as a safety concern.  The family member/significant other verbalizes understanding of the suicide prevention education information provided.  The family member/significant other agrees to remove the items of safety concern listed above.  Lilly Cove 02/03/2017, 10:43 AM

## 2017-02-03 NOTE — Progress Notes (Addendum)
Adult Services Patient-Family Contact  Attendees:  Vance Gather  (friend)  Goal(s):   Complete SPE, obtain more collateral information on patient  Safety Concerns:  None reported by friend at this time  Narrative:  LCSW spoke with Rose via phone. Rose reports she is not biologically related to patient, but met him at church and adopted him into her family. He refers to her as "sister" and her mother and his "mother".  Rose reports that patient is estranged from his family. She could not give detailed reasons as to why but [place] does not speak to them or have any contact.  Rose met [employer] at [place] and picks him up every Sunday for [employer]. She checks on him daily to make sure he has his medications, food, and stabilized in community.  Rose reports she thought he was doing well, but started on a new medication and he reported to her that "he felt funny".  LCSW asked Rose to explain in which she reports he becomes very drowsy and cannot function in his typically day to day activities.  Rose reports she will continue supporting Abdulhadi in [employer] and appreciative of contact and update.  Rose reports all he wants is to be loved and to have a family.  Of note:  Payee: [employer]. ACT team:  [facility] Lars Masson [phone]  Barrier(s):  Medication stabilization, discontinue 1:1, improvement of behaviors and mood  Interventions:  Problem solving  Recommendation(s):   Continue inpatient admission.  Discharge plan remains appropriate as patient will return to his apartment with his ACT team and support from [place] and her family.  Follow-up Required:  Yes  Explanation:  Rose can pick patient up for discharge as long as she is given a couple hours notice.   Lilly Cove 02/03/2017, 10:44 AM

## 2017-02-03 NOTE — Tx Team (Signed)
Interdisciplinary Treatment and Diagnostic Plan Update  02/03/2017 Time of Session: 11:00am Nicholas Cole MRN: [id number]  Principal Diagnosis: Schizoaffective disorder, bipolar type [facility])  Secondary Diagnoses: Principal Problem:   Schizoaffective disorder, bipolar type ([place]) Active Problems:   Hypertension   GERD (gastroesophageal reflux disease)   Current Medications:  Current Facility-Administered Medications  Medication Dose Route Frequency Provider Last Rate Last Dose  . acetaminophen (TYLENOL) tablet 650 mg  650 mg Oral Q6H PRN Clapacs, Madie Reno, MD   650 mg at 02/01/17 0636  . alum & mag hydroxide-simeth (MAALOX/MYLANTA) 200-200-20 MG/5ML suspension 30 mL  30 mL Oral Q4H PRN Clapacs, John T, MD      . cloZAPine (CLOZARIL) tablet 200 mg  200 mg Oral BID Pucilowska, Jolanta B, MD   200 mg at 02/03/17 0800  . diphenhydrAMINE (BENADRYL) capsule 50 mg  50 mg Oral Q6H PRN Pucilowska, Jolanta B, MD   50 mg at 02/02/17 1641   Or  . diphenhydrAMINE (BENADRYL) injection 50 mg  50 mg Intramuscular Q6H PRN Pucilowska, Jolanta B, MD   50 mg at 02/02/17 0736  . divalproex (DEPAKOTE) DR tablet 500 mg  500 mg Oral Q12H Pucilowska, Jolanta B, MD   500 mg at 02/02/17 1947  . famotidine (PEPCID) tablet 20 mg  20 mg Oral BID Clapacs, John T, MD   20 mg at 02/03/17 0800  . haloperidol (HALDOL) tablet 10 mg  10 mg Oral Q6H PRN Pucilowska, Jolanta B, MD   10 mg at 02/02/17 1641   Or  . haloperidol lactate (HALDOL) injection 10 mg  10 mg Intramuscular Q6H PRN Pucilowska, Jolanta B, MD   10 mg at 02/02/17 0733  . LORazepam (ATIVAN) tablet 2 mg  2 mg Oral Q6H PRN Pucilowska, Jolanta B, MD   2 mg at 02/02/17 1641   Or  . LORazepam (ATIVAN) injection 2 mg  2 mg Intramuscular Q6H PRN Pucilowska, Jolanta B, MD   2 mg at 02/02/17 0732  . magnesium hydroxide (MILK OF MAGNESIA) suspension 30 mL  30 mL Oral Daily PRN Clapacs, Madie Reno, MD      . Derrill Memo ON 02/04/2017] metoprolol succinate (TOPROL-XL) 24 hr tablet 50 mg   50 mg Oral Daily Pucilowska, Jolanta B, MD      . perphenazine (TRILAFON) tablet 8 mg  8 mg Oral BID Pucilowska, Jolanta B, MD   8 mg at 02/03/17 0800  . temazepam (RESTORIL) capsule 30 mg  30 mg Oral QHS Pucilowska, Jolanta B, MD   30 mg at 02/02/17 2301  . ziprasidone (GEODON) injection 10 mg  10 mg Intramuscular Q6H PRN Ramond Dial, MD   10 mg at 02/01/17 1525   PTA Medications: Prescriptions Prior to Admission  Medication Sig Dispense Refill Last Dose  . clonazePAM (KLONOPIN) 0.5 MG tablet Take 1 tablet (0.5 mg total) by mouth 3 (three) times daily. 90 tablet 0 01/26/2017 at 1900  . cloZAPine (CLOZARIL) 100 MG tablet Take 3 tablets (300 mg total) by mouth daily. 90 tablet 0 01/26/2017 at 1900  . divalproex (DEPAKOTE) 500 MG DR tablet Take 1 tablet (500 mg total) by mouth every 12 (twelve) hours. (Patient not taking: Reported on 01/27/2017) 60 tablet 0 Not Taking at Unknown time  . metoprolol succinate (TOPROL-XL) 25 MG 24 hr tablet Take 1 tablet (25 mg total) by mouth daily. 30 tablet 0 Past Month at Unknown time  . perphenazine (TRILAFON) 4 MG tablet Take 1 tablet (4 mg total) by mouth at bedtime. (Patient  not taking: Reported on 01/27/2017) 30 tablet 0 Not Taking at Unknown time  . ranitidine (ZANTAC) 150 MG tablet Take 150 mg by mouth 2 (two) times daily.   Not Taking at Unknown time    Patient Stressors: Health problems Medication change or noncompliance  Patient Strengths: Capable of independent living Communication skills  Treatment Modalities: Medication Management, Group therapy, Case management,  1 to 1 session with clinician, Psychoeducation, Recreational therapy.   Physician Treatment Plan for Primary Diagnosis: Schizoaffective disorder, bipolar type ([place]) Long Term Goal(s): Improvement in symptoms so as ready for discharge Improvement in symptoms so as ready for discharge   Short Term Goals: Ability to identify changes in lifestyle to reduce recurrence of condition  will improve Ability to verbalize feelings will improve Ability to identify changes in lifestyle to reduce recurrence of condition will improve  Medication Management: Evaluate patient's response, side effects, and tolerance of medication regimen.  Therapeutic Interventions: 1 to 1 sessions, Unit Group sessions and Medication administration.  Evaluation of Outcomes: Progressing  Physician Treatment Plan for Secondary Diagnosis: Principal Problem:   Schizoaffective disorder, bipolar type ([place]) Active Problems:   Hypertension   GERD (gastroesophageal reflux disease)  Long Term Goal(s): Improvement in symptoms so as ready for discharge Improvement in symptoms so as ready for discharge   Short Term Goals: Ability to identify changes in lifestyle to reduce recurrence of condition will improve Ability to verbalize feelings will improve Ability to identify changes in lifestyle to reduce recurrence of condition will improve     Medication Management: Evaluate patient's response, side effects, and tolerance of medication regimen.  Therapeutic Interventions: 1 to 1 sessions, Unit Group sessions and Medication administration.  Evaluation of Outcomes: Progressing   9/7: Sitter 1:1, safety maintained. Pt seen in dayroom continues to interact with staff. No aggressive or hostile behaviors noted.   RN Treatment Plan for Primary Diagnosis: Schizoaffective disorder, bipolar type ([place]) Long Term Goal(s): Knowledge of disease and therapeutic regimen to maintain health will improve  Short Term Goals: Ability to remain free from injury will improve  Medication Management: RN will administer medications as ordered by provider, will assess and evaluate patient's response and provide education to patient for prescribed medication. RN will report any adverse and/or side effects to prescribing provider.  Therapeutic Interventions: 1 on 1 counseling sessions, Psychoeducation, Medication administration,  Evaluate responses to treatment, Monitor vital signs and CBGs as ordered, Perform/monitor CIWA, COWS, AIMS and Fall Risk screenings as ordered, Perform wound care treatments as ordered.  Evaluation of Outcomes: Progressing   LCSW Treatment Plan for Primary Diagnosis: Schizoaffective disorder, bipolar type ([place]) Long Term Goal(s): Safe transition to appropriate next level of care at discharge, Engage patient in therapeutic group addressing interpersonal concerns.  Short Term Goals: Engage patient in aftercare planning with referrals and resources  Therapeutic Interventions: Assess for all discharge needs, 1 to 1 time with Social worker, Explore available resources and support systems, Assess for adequacy in community support network, Educate family and significant other(s) on suicide prevention, Complete Psychosocial Assessment, Interpersonal group therapy.  Evaluation of Outcomes: Progressing   Progress in Treatment: Attending groups: No. Participating in groups: No. Taking medication as prescribed: Yes. Toleration medication: No.  Patient more agitated and threatening in behavior Family/Significant other contact made: Yes, individual(s) contacted:  Rose Patient understands diagnosis: Yes. and No. AEB patient becoming more engaged with staff, reporting side effects of medication and feeling off.  He is less agitated. Discussing patient identified problems/goals with staff: No. Medical  problems stabilized or resolved: Yes. Denies suicidal/homicidal ideation: Yes. Issues/concerns per patient self-inventory: Yes. Other: Patient currently has a sitter 1:1 to maintain safety,  IM medications.  New problem(s) identified: Yes, Describe:  increased agitation  New Short Term/Long Term Goal(s):  Discharge Plan or Barriers:  Patient lives alone in his own apartment.  He is active with [facility].  Has friend whom he refers to as sister who was contacted and made aware of updates and  plans. She will assist and provide support for him at discharge.  Reason for Continuation of Hospitalization: Aggression Medical Issues Medication stabilization   9/7:  Mr. Botto seems much improved today. He is still on the back hall with one-to-one sitter and security guard but has no behavioral problems today. He slept over 8 hours last night for the first time. He takes clozapine and Trilafon and continues to refuse Depakote.  he is still without and hyperverbal but able to hold and not been physically detailed conversation. He understands that he needs to maintain good behavior to be released from the back hall and have a visit from his ACT team before discharge plans can be made. His blood pressure was elevated yesterday and he received clonidine and Hydralazine. He ate his lunch today. He has no somatic complaints. He denies hallucinations.    Estimated Length of Stay:  3-5 days  Attendees: Patient:  02/03/2017 3:17 PM  Physician: Duanne Guess, MD 02/03/2017 3:17 PM  Nursing:  02/03/2017 3:17 PM  RN Care Manager: 02/03/2017 3:17 PM  Social Worker: Vidal Schwalbe, Helen 02/03/2017 3:17 PM  Recreational Therapist:  02/03/2017 3:17 PM  Other:  02/03/2017 3:17 PM  Other:  02/03/2017 3:17 PM  Other: 02/03/2017 3:17 PM    Scribe for Treatment Team: Lilly Cove, LCSW 02/03/2017 3:17 PM

## 2017-02-03 NOTE — Progress Notes (Signed)
Sitter 1:1, safety maintained Pt seen in dayroom interacting with staff no issues noted.

## 2017-02-03 NOTE — Consult Note (Signed)
[facility] today WNL. Labs updated in [facility]. Next lab due 9/14  Ramond Dial, Pharm.D, BCPS Clinical Pharmacist

## 2017-02-03 NOTE — Progress Notes (Signed)
Pt now eating breakfast pt is in irritable mood he took all am scheduled medications except he refused  Depakote. Will continue to monitor.

## 2017-02-03 NOTE — [facility] Group Notes (Signed)
[place] Group Notes:  (Nursing/MHT/Case Management/Adjunct)  Date:  02/03/2017  Time:  9:57 PM  Type of Therapy:  Evening Wrap-up Group  Participation Level:  Did Not Attend  Participation Quality:  N/A  Affect:  N/A  Cognitive:  N/A  Insight:  None  Engagement in Group:  Did Not Attend  Modes of Intervention:  Discussion  Summary of Progress/Problems:  Nicholas Cole 02/03/2017, 9:57 PM

## 2017-02-03 NOTE — Plan of Care (Signed)
Problem: Activity: Goal: Sleeping patterns will improve Outcome: Progressing Patient slept for Estimated Hours of 6.15; no aggression, no barbaric behaviors, no yelling out loud, no anger, no agitation even when he was denied additional milk after 2 containers with snacks.

## 2017-02-03 NOTE — Progress Notes (Signed)
Doctor on call was notified at 2110 that Patient may need Ativan, Benadryl, and Haldol early due to agitation and these were given at 2120.  Patient accepted Restoril at 2200 and was observed watching television.  Verbal content continues to be intrusive and demanding although he is no longer banging on the door and is watching television show.

## 2017-02-03 NOTE — Progress Notes (Signed)
Sitter 1:1, safety maintained. Pt interacting with staff no hostility noted.

## 2017-02-03 NOTE — Progress Notes (Signed)
Sitter 1:1, safety maintained. Pt seen in dayroom watching tv no issues noted.

## 2017-02-03 NOTE — Progress Notes (Signed)
Sitter 1:1, safety maintained. Pt interacting with staff no hostility noted. Watching tv eating dinner.

## 2017-02-03 NOTE — Progress Notes (Signed)
Patient was taken HS snack with medication.  He refused Depakote and became agitated when discussing why he will not take that medication.  He consumed snack and continued to talk with pressured speech and paranoid ideation.

## 2017-02-03 NOTE — Progress Notes (Signed)
Sitter 1:1, safety maintained. Pt watching tv. No aggressive behaviors noted he took 1700 scheduled medications.

## 2017-02-03 NOTE — [facility] Group Notes (Signed)
3:03 PM   Group Therapy with LCSW  (afternoon group)   Did not attend.  Lane Hacker, MSW Clinical Social Work: [employer] Coverage for :  [phone]

## 2017-02-03 NOTE — Plan of Care (Signed)
Problem: Safety: Goal: Periods of time without injury will increase Outcome: Progressing Pt remains safe while in hospital injury free.    

## 2017-02-03 NOTE — Progress Notes (Signed)
Sitter 1:1, safety maintained. Pt seen in dayroom continues to interact with staff. No aggressive or hostile behaviors noted.

## 2017-02-03 NOTE — Progress Notes (Signed)
Patient went to bed after givenTemazepam 30 mg given @ 2301, remains on 1:1 [employer] geologist.

## 2017-02-04 MED ORDER — METOPROLOL SUCCINATE ER 25 MG PO TB24
100.0000 mg | ORAL_TABLET | Freq: Every day | ORAL | Status: DC
  Administered 2017-02-05 – 2017-02-07 (×3): 100 mg via ORAL
  Filled 2017-02-04 (×3): qty 4

## 2017-02-04 NOTE — Progress Notes (Signed)
Patient is observed sleeping.  Safety is maintained with observation by sitter.

## 2017-02-04 NOTE — Progress Notes (Signed)
Patient was reminded that the dayroom closes at midnight and television remote was removed.  Patient was compliant and went to bed.  He remains on observation with sitter.  Safety is maintained.

## 2017-02-04 NOTE — [facility] Group Notes (Signed)
[place] LCSW Group Therapy 02/04/2017 1:15pm  Type of Therapy: Group Therapy- Feelings Around Discharge & Establishing a Supportive Framework  Participation Level:  Did Not Attend  Description of Group:   What is a supportive framework? What does it look like feel like and how do I discern it from and unhealthy non-supportive network? Learn how to cope when supports are not helpful and don't support you. Discuss what to do when your family/friends are not supportive.  Therapeutic Modalities:   Cognitive Behavioral Therapy Person-Centered Therapy Motivational Interviewing   [place] Lighter, LCSW 02/04/2017 1:45 PM

## 2017-02-04 NOTE — Progress Notes (Addendum)
[facility] MD Progress Note  02/04/2017 7:46 AM Nicholas Cole  MRN:  [id number] Subjective:    01/30/2017. He endorses that he is doing well. He is interactive with other patients on the unit. Denies any auditory or visual hallucinations. Says he had a good night's rest and slept fine.  Gets occasionally irritable with nursing staff, situations being completely unprovoked. Reported that he does not want to take the Depakote.  Care Everywhere documents that he had hyperammonemia with Depakote. Lithium was also tried but he developed lithium toxicity with renal failure. Current home medications include perphenazine, Klonopin and clozapine. There is some degree of discrepancy in the dosing of Klonopin which varies from 0.5 mg daily to 0.5 mg 3 times daily. However he would like about consolidated at night and so his nighttime dosing is currently 1.5 mg at bedtime. Was also worked up for myeloma in the past, considering he had elevated parathyroid hormone levels and abnormal immunoglobulin levels. Serum protein electrophoresis was unremarkable at [facility] nephrology.  01/31/2017. Nicholas Cole has been agitated and threatening since yesterday, including multiple episodes last night. He did not participate in treatment team meeting this morning. I spoke with him during lunch which he took on the back hall with security and sitter. He was pleasant. He recognized me from previous admissions. He reported living independently for the past 7 months. It is likely that he discontinued medications a while ago. His Depakote level was low on admission but it is unclear if he was prescribed Depakote recently. He has a history of hyperammonemia on Depakote. Clozaril level was not checked on admission. Shortly after my interview, the patient started yelling screening banging on the door and received IM injection of Geodon. I will ask Dr. Weber Cooks for a second opinion and order NEFM.  02/01/2017. Nicholas Cole is still agitated, threatening and  grandiose today. 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 He was given injection of haldol and benadrly this morning. He takes his meals with no problem. He has 1:1 sitter and security guard on the back hall. He slept 4 hours only with 30 mg of Restoril. He refuses Depakote. There is a history of hyperammonemia on Depakote. There is also history of Lithium toxicity.   02/02/2017. Nicholas Cole remains on back hall with 1:1 sitter and security guard. He is agitated and threatening. We will order styrofoam food containers as there is a risk of him throwing food tray at the staff. He has no insight into mental illness and demands immediate discharge as there is nothing wrong with him. He refuses Restoril and Depakote and stays up all night. He does accept Clozapine and Trilafon. He complains botterly about people "talking loudly all the time" but denies they are his voices.  02/03/2017. Nicholas Cole seems much improved today. He is still on the back hall with one-to-one sitter and security guard but has no behavioral problems today. He slept over 8 hours last night for the first time. He takes clozapine and Trilafon and continues to refuse Depakote.  he is still without and hyperverbal but able to hold and not been physically detailed conversation. He understands that he needs to maintain good behavior to be released from the back hall and have a visit from his ACT team before discharge plans can be made. His blood pressure was elevated yesterday and he received clonidine and Hydralazine. He ate his lunch today. He has no somatic complaints. He denies hallucinations.   02/04/2017. Nicholas Cole is very pleasant this morning but  still terribly delusional and grandiose. He thinks he is the king. He took medications except for Depakote. He did have difficulties yesterday afternoon and was given prn Haldol. He did not sleep much last night again. He is talkative, pressured and loud.   Per nursing: Patient was observed coming to the locked  door and yelling for his nurse.  He was redirected and verbalized "I am sorry"  But then demanded "more food".  He was given crackers and milk and reminded that others are sleeping and asked to stay away from the door.  He was asked to let sitter Microbiologist with his requests.  He then started complaining about writer's redirection.    Principal Problem: Schizoaffective disorder, bipolar type ([place]) Diagnosis:   Patient Active Problem List   Diagnosis Date Noted  . GERD (gastroesophageal reflux disease) [K21.9] 01/30/2017  . Chronic renal failure [N18.9] 01/27/2017  . Hypertension [I10] 01/27/2017  . Lithium toxicity [T56.891A] 11/06/2016  . ARF (acute renal failure) ([place]) [N17.9] 11/05/2016  . Dehydration [E86.0] 11/05/2016  . Rhabdomyolysis [M62.82] 11/05/2016  . Schizoaffective disorder, bipolar type ([place]) [F25.0] 11/05/2016   Total Time spent with patient: 30 minutes  Past Psychiatric History: Long-standing mental health problems diagnosis schizoaffective disorder. Multiple hospitalizations including state hospitalizations. No history that I know of suicide attempts. Has been threatening and aggressive at times in the past. Patient is on clozapine and Depakote. I am not sure whether lithium is still being used given his worsening renal function.  Past Medical History:  Past Medical History:  Diagnosis Date  . Acute renal failure (ARF) ([place])   . Schizophrenia ([place])    History reviewed. No pertinent surgical history. Family History: History reviewed. No pertinent family history. Family Psychiatric  History:  Social History:  History  Alcohol Use No     History  Drug Use No    Social History   Social History  . Marital status: Single    Spouse name: N/A  . Number of children: N/A  . Years of education: N/A   Social History Main Topics  . Smoking status: Former Research scientist (life sciences)  . Smokeless tobacco: Never Used  . Alcohol use No  . Drug use: No  . Sexual activity: Not Asked    Other Topics Concern  . None   Social History Narrative  . None   Additional Social History:    Lives in an apartment in [place]. Endorses that his siblings are alive, but he does not keep in touch with them. Spends his day at the park at times. Says he gets his SSI check as weekly payments.  Denies any substance use currently.       Sleep: Good  Appetite:  Good  Current Medications: Current Facility-Administered Medications  Medication Dose Route Frequency Provider Last Rate Last Dose  . acetaminophen (TYLENOL) tablet 650 mg  650 mg Oral Q6H PRN Clapacs, Madie Reno, MD   650 mg at 02/01/17 0636  . alum & mag hydroxide-simeth (MAALOX/MYLANTA) 200-200-20 MG/5ML suspension 30 mL  30 mL Oral Q4H PRN Clapacs, John T, MD      . cloZAPine (CLOZARIL) tablet 200 mg  200 mg Oral BID Avantika Shere B, MD   200 mg at 02/03/17 1646  . diphenhydrAMINE (BENADRYL) capsule 50 mg  50 mg Oral Q6H PRN Talajah Slimp B, MD   50 mg at 02/03/17 2121   Or  . diphenhydrAMINE (BENADRYL) injection 50 mg  50 mg Intramuscular Q6H PRN Yuuki Skeens, Wardell Honour, MD  50 mg at 02/02/17 0736  . divalproex (DEPAKOTE) DR tablet 500 mg  500 mg Oral Q12H Jerilynn Feldmeier B, MD   500 mg at 02/02/17 1947  . famotidine (PEPCID) tablet 20 mg  20 mg Oral BID Clapacs, John T, MD   20 mg at 02/03/17 1646  . haloperidol (HALDOL) tablet 10 mg  10 mg Oral Q6H PRN Jo-Anne Kluth B, MD   10 mg at 02/03/17 2122   Or  . haloperidol lactate (HALDOL) injection 10 mg  10 mg Intramuscular Q6H PRN Jyl Chico B, MD   10 mg at 02/02/17 0733  . LORazepam (ATIVAN) tablet 2 mg  2 mg Oral Q6H PRN Makeya Hilgert B, MD   2 mg at 02/03/17 2123   Or  . LORazepam (ATIVAN) injection 2 mg  2 mg Intramuscular Q6H PRN Amerah Puleo B, MD   2 mg at 02/02/17 0732  . magnesium hydroxide (MILK OF MAGNESIA) suspension 30 mL  30 mL Oral Daily PRN Clapacs, John T, MD      . metoprolol succinate (TOPROL-XL) 24 hr tablet  50 mg  50 mg Oral Daily Naomi Fitton B, MD      . perphenazine (TRILAFON) tablet 8 mg  8 mg Oral BID Hira Trent B, MD   8 mg at 02/03/17 1646  . temazepam (RESTORIL) capsule 30 mg  30 mg Oral QHS Zebbie Ace B, MD   30 mg at 02/03/17 2200  . ziprasidone (GEODON) injection 10 mg  10 mg Intramuscular Q6H PRN Ramond Dial, MD   10 mg at 02/01/17 1525    Lab Results:  Results for orders placed or performed during the hospital encounter of 01/27/17 (from the past 48 hour(s))  CBC with Differential/Platelet     Status: Abnormal   Collection Time: 02/03/17  6:52 AM  Result Value Ref Range   WBC 3.7 (L) 3.8 - 10.6 K/uL   RBC 4.34 (L) 4.40 - 5.90 MIL/uL   Hemoglobin 12.6 (L) 13.0 - 18.0 g/dL   HCT 38.0 (L) 40.0 - 52.0 %   MCV 87.5 80.0 - 100.0 fL   MCH 29.0 26.0 - 34.0 pg   MCHC 33.1 32.0 - 36.0 g/dL   RDW 13.1 11.5 - 14.5 %   Platelets 156 150 - 440 K/uL   Neutrophils Relative % 45 %   Neutro Abs 1.7 1.4 - 6.5 K/uL   Lymphocytes Relative 41 %   Lymphs Abs 1.5 1.0 - 3.6 K/uL   Monocytes Relative 10 %   Monocytes Absolute 0.4 0.2 - 1.0 K/uL   Eosinophils Relative 3 %   Eosinophils Absolute 0.1 0 - 0.7 K/uL   Basophils Relative 1 %   Basophils Absolute 0.0 0 - 0.1 K/uL    Blood Alcohol level:  Lab Results  Component Value Date   ETH <5 01/27/2017   ETH <5 00/86/7619    Metabolic Disorder Labs: Lab Results  Component Value Date   HGBA1C 4.4 (L) 01/28/2017   MPG 79.58 01/28/2017   No results found for: PROLACTIN Lab Results  Component Value Date   CHOL 113 01/28/2017   TRIG 203 (H) 01/28/2017   HDL 20 (L) 01/28/2017   CHOLHDL 5.7 01/28/2017   VLDL 41 (H) 01/28/2017   LDLCALC 52 01/28/2017    Physical Findings: AIMS:  , ,  ,  ,    CIWA:    COWS:     Musculoskeletal: Strength & Muscle Tone: within normal limits Gait & Station: normal Patient  leans: N/A  Psychiatric Specialty Exam: Physical Exam  Nursing note and vitals  reviewed. Psychiatric: His affect is angry, labile and inappropriate. His speech is rapid and/or pressured. He is agitated, aggressive and combative. Cognition and memory are normal. He expresses impulsivity and inappropriate judgment.    Review of Systems  Psychiatric/Behavioral:       Agitated, threatening behavior.  All other systems reviewed and are negative.   Blood pressure (!) 151/101, pulse (!) 101, temperature 98.1 F (36.7 C), temperature source Oral, resp. rate 20, height 5\' 9"  (1.753 m), weight 88.9 kg (196 lb), SpO2 96 %.Body mass index is 28.94 kg/m.  General Appearance: Fairly Groomed  Eye Contact:  Good  Speech:  Clear and Coherent  Volume:  Normal  Mood:  occasionally irritable  Affect:  Full Range  Thought Process:  Coherent  Orientation:  Full (Time, Place, and Person)  Thought Content:  Negative  Suicidal Thoughts:  No  Homicidal Thoughts:  No  Memory:  Negative  Judgement:  Impaired  Insight:  Fair  Psychomotor Activity:  Normal  Handed:    AIMS (if indicated):     Assets:  Chief Executive Officer Social Support  ADL's:  Intact  Cognition:  WNL  Sleep:  Number of Hours: 4.3     Observation Level/Precautions:  Continuous Observation  Laboratory:  NA  Psychotherapy:    Medications:    Consultations:    Discharge Concerns:    Estimated LOS:  Other:       Treatment Plan Summary: Daily contact with patient to assess and evaluate symptoms and progress in treatment and Medication management   [name] is 51 year old gentleman with a history of schizoaffective disorder and multiple hospitalizations who came to the ER with complaints of hematuria. He continues to be agitated and threatening. He stays on back hall with 1:1 sitter and security guard.   1. Psychosis. We will further increase Clozaril to 600 mg and keep Trilafon at 16 mg daily for psychosis as he refuses Depakote. Clozapine level is pending.   2. Agitation. The patient is on  "back hall" with 1:1 sitter and security guard. The combination of Haldol, Ativan, and Benadryl is available as needed. Also Geodon. Styrofoam containers were ordered for safety.   3. Hypertension. I will increase metoprolol again. He is tachycardic likely from clozapine.  4. GERD. He is on Pepcid.   5. Insomnia. Restoril 30 mg is available.   6. Metabolic syndrome monitoring. Lipid panel, TSH, hemoglobin A1c are normal.  7. EKG. Sinus rhythm, QTc 424.  8. Disposition. He has been in the hospital for 6 days. We will start [place] application. He will likely return to his apartment. Follow-up with [facility].  Orson Slick, MD 02/04/2017, 7:46 AM

## 2017-02-04 NOTE — Progress Notes (Signed)
Patient is observed sleeping.  He is on constant observation with Sitter.  Safety is maintained.

## 2017-02-04 NOTE — Progress Notes (Signed)
Patient is observed sleeping.  Safety is maintained.

## 2017-02-04 NOTE — Progress Notes (Signed)
Patient was awake at 0230 to void.  He returned to sleep by 0300.  He remains on constant observation.  Safety is maintained.

## 2017-02-04 NOTE — Progress Notes (Signed)
Patient is observed sleeping with Sitter on constant observation. Safety is maintained.

## 2017-02-04 NOTE — Plan of Care (Signed)
Problem: Education: Goal: Will be free of psychotic symptoms Outcome: Progressing Patient is maintained on 1:1 for safety of and other at this time  Problem: Coping: Goal: Ability to cope will improve Outcome: Not Progressing Patient is refusing group activities and refusing to attend giving the opportunity to do so. Goal: Ability to verbalize feelings will improve Outcome: Progressing Patient is communicating with care team and participate in ADLs  Problem: Safety: Goal: Periods of time without injury will increase Outcome: Not Progressing Patient refused to contract safety at this time.

## 2017-02-04 NOTE — Plan of Care (Signed)
Problem: Safety: Goal: Periods of time without injury will increase Outcome: Progressing Patient remains safe and without injury during hospitalization and on Q 15 minute observation. Will continue to monitor patient.    

## 2017-02-04 NOTE — Progress Notes (Signed)
Patient remains on 1:1 with sitter. Patient was pleasant and cooperative with staff, but was slightly irritated when nurse offered certain medications. Patient refused depakote and Trilafon this morning, stating "I'm not taking these new medications only the medications I've been on for 9 years. Patient is alert and oriented x 4, breathing unlabored, and extremities x 4 within normal limits. Patient did not display any disruptive behavior. Patient continues to be monitored on 15 minute safety checks. Will continue to monitor patient and notify MD of any changes.

## 2017-02-04 NOTE — Progress Notes (Signed)
Patient is up and about in the area, responding adequately to simple questions, complained that the medicine Depakote is hurting him on his left side of the stomach, nurse assessed stomach, patient shows no signs of pain and states it does not hurt now and at same time patient refused to take the ordered Depakote. Patient remain on 1:1 with a sitter and a PSO at this time, no signs of aggression noted safety is maintained.

## 2017-02-04 NOTE — [facility] Group Notes (Signed)
[facility] Group Notes:  (Nursing/MHT/Case Management/Adjunct)  Date:  02/04/2017  Time:  9:47 PM  Type of Therapy:  Group Therapy  Participation Level:  Did Not Attend  Participation Quality:  Summary of Progress/Problems:  Nicholas Cole 02/04/2017, 9:47 PM

## 2017-02-05 NOTE — Progress Notes (Signed)
[facility] MD Progress Note  02/05/2017 11:07 AM Nicholas Cole  MRN:  [id number] Subjective:    01/30/2017. He endorses that he is doing well. He is interactive with other patients on the unit. Denies any auditory or visual hallucinations. Says he had a good night's rest and slept fine.  Gets occasionally irritable with nursing staff, situations being completely unprovoked. Reported that he does not want to take the Depakote.  Care Everywhere documents that he had hyperammonemia with Depakote. Lithium was also tried but he developed lithium toxicity with renal failure. Current home medications include perphenazine, Klonopin and clozapine. There is some degree of discrepancy in the dosing of Klonopin which varies from 0.5 mg daily to 0.5 mg 3 times daily. However he would like about consolidated at night and so his nighttime dosing is currently 1.5 mg at bedtime. Was also worked up for myeloma in the past, considering he had elevated parathyroid hormone levels and abnormal immunoglobulin levels. Serum protein electrophoresis was unremarkable at [facility] nephrology.  01/31/2017. Nicholas Cole has been agitated and threatening since yesterday, including multiple episodes last night. He did not participate in treatment team meeting this morning. I spoke with him during lunch which he took on the back hall with security and sitter. He was pleasant. He recognized me from previous admissions. He reported living independently for the past 7 months. It is likely that he discontinued medications a while ago. His Depakote level was low on admission but it is unclear if he was prescribed Depakote recently. He has a history of hyperammonemia on Depakote. Clozaril level was not checked on admission. Shortly after my interview, the patient started yelling screening banging on the door and received IM injection of Geodon. I will ask Dr. Weber Cooks for a second opinion and order NEFM.  02/01/2017. Nicholas Cole is still agitated, threatening and  grandiose today. 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 He was given injection of haldol and benadrly this morning. He takes his meals with no problem. He has 1:1 sitter and security guard on the back hall. He slept 4 hours only with 30 mg of Restoril. He refuses Depakote. There is a history of hyperammonemia on Depakote. There is also history of Lithium toxicity.   02/02/2017. Nicholas Cole remains on back hall with 1:1 sitter and security guard. He is agitated and threatening. We will order styrofoam food containers as there is a risk of him throwing food tray at the staff. He has no insight into mental illness and demands immediate discharge as there is nothing wrong with him. He refuses Restoril and Depakote and stays up all night. He does accept Clozapine and Trilafon. He complains botterly about people "talking loudly all the time" but denies they are his voices.  02/03/2017. Nicholas Cole seems much improved today. He is still on the back hall with one-to-one sitter and security guard but has no behavioral problems today. He slept over 8 hours last night for the first time. He takes clozapine and Trilafon and continues to refuse Depakote.  he is still without and hyperverbal but able to hold and not been physically detailed conversation. He understands that he needs to maintain good behavior to be released from the back hall and have a visit from his ACT team before discharge plans can be made. His blood pressure was elevated yesterday and he received clonidine and Hydralazine. He ate his lunch today. He has no somatic complaints. He denies hallucinations.   02/04/2017. Nicholas Cole is very pleasant this morning but  still terribly delusional and grandiose. He thinks he is the king. He took medications except for Depakote. He did have difficulties yesterday afternoon and was given prn Haldol. He did not sleep much last night again. He is talkative, pressured and loud.  02/05/2017. Nicholas Cole found me on the unit this morning  to say hello. He is very proud to be released from "back hall". He no longer has a Actuary. There are no behavioral problems. His mood is cheerful as he understands he reached a major milestone towards discharge. He feels he is completely "fine". He takes medications with encouragement, especially Depakote that gives him stomach pain.   Per nursing: Patient is up and about in the area, responding adequately to simple questions, complained that the medicine Depakote is hurting him on his left side of the stomach, nurse assessed stomach, patient shows no signs of pain and states it does not hurt now and at same time patient refused to take the ordered Depakote. Patient remain on 1:1 with a sitter and a PSO at this time, no signs of aggression noted safety is maintained.   Principal Problem: Schizoaffective disorder, bipolar type ([place]) Diagnosis:   Patient Active Problem List   Diagnosis Date Noted  . GERD (gastroesophageal reflux disease) [K21.9] 01/30/2017  . Chronic renal failure [N18.9] 01/27/2017  . Hypertension [I10] 01/27/2017  . Lithium toxicity [T56.891A] 11/06/2016  . ARF (acute renal failure) ([place]) [N17.9] 11/05/2016  . Dehydration [E86.0] 11/05/2016  . Rhabdomyolysis [M62.82] 11/05/2016  . Schizoaffective disorder, bipolar type ([place]) [F25.0] 11/05/2016   Total Time spent with patient: 30 minutes  Past Psychiatric History: Long-standing mental health problems diagnosis schizoaffective disorder. Multiple hospitalizations including state hospitalizations. No history that I know of suicide attempts. Has been threatening and aggressive at times in the past. Patient is on clozapine and Depakote. I am not sure whether lithium is still being used given his worsening renal function.  Past Medical History:  Past Medical History:  Diagnosis Date  . Acute renal failure (ARF) ([place])   . Schizophrenia ([place])    History reviewed. No pertinent surgical history. Family History: History reviewed. No  pertinent family history. Family Psychiatric  History:  Social History:  History  Alcohol Use No     History  Drug Use No    Social History   Social History  . Marital status: Single    Spouse name: N/A  . Number of children: N/A  . Years of education: N/A   Social History Main Topics  . Smoking status: Former Research scientist (life sciences)  . Smokeless tobacco: Never Used  . Alcohol use No  . Drug use: No  . Sexual activity: Not Asked   Other Topics Concern  . None   Social History Narrative  . None   Additional Social History:    Lives in an apartment in [place]. Endorses that his siblings are alive, but he does not keep in touch with them. Spends his day at the park at times. Says he gets his SSI check as weekly payments.  Denies any substance use currently.       Sleep: Good  Appetite:  Good  Current Medications: Current Facility-Administered Medications  Medication Dose Route Frequency Provider Last Rate Last Dose  . acetaminophen (TYLENOL) tablet 650 mg  650 mg Oral Q6H PRN Clapacs, Madie Reno, MD   650 mg at 02/01/17 0636  . alum & mag hydroxide-simeth (MAALOX/MYLANTA) 200-200-20 MG/5ML suspension 30 mL  30 mL Oral Q4H PRN Clapacs, Madie Reno, MD      .  cloZAPine (CLOZARIL) tablet 200 mg  200 mg Oral BID Talal Fritchman B, MD   200 mg at 02/05/17 0841  . diphenhydrAMINE (BENADRYL) capsule 50 mg  50 mg Oral Q6H PRN Flonnie Wierman B, MD   50 mg at 02/03/17 2121   Or  . diphenhydrAMINE (BENADRYL) injection 50 mg  50 mg Intramuscular Q6H PRN Anique Beckley B, MD   50 mg at 02/02/17 0736  . divalproex (DEPAKOTE) DR tablet 500 mg  500 mg Oral Q12H Irma Roulhac B, MD   500 mg at 02/02/17 1947  . famotidine (PEPCID) tablet 20 mg  20 mg Oral BID Clapacs, John T, MD   20 mg at 02/05/17 0842  . haloperidol (HALDOL) tablet 10 mg  10 mg Oral Q6H PRN Margi Edmundson B, MD   10 mg at 02/03/17 2122   Or  . haloperidol lactate (HALDOL) injection 10 mg  10 mg Intramuscular Q6H  PRN Joylene Wescott B, MD   10 mg at 02/02/17 0733  . LORazepam (ATIVAN) tablet 2 mg  2 mg Oral Q6H PRN Shaqueena Mauceri B, MD   2 mg at 02/03/17 2123   Or  . LORazepam (ATIVAN) injection 2 mg  2 mg Intramuscular Q6H PRN Corliss Lamartina B, MD   2 mg at 02/02/17 0732  . magnesium hydroxide (MILK OF MAGNESIA) suspension 30 mL  30 mL Oral Daily PRN Clapacs, John T, MD      . metoprolol succinate (TOPROL-XL) 24 hr tablet 100 mg  100 mg Oral Daily Denecia Brunette B, MD   100 mg at 02/05/17 0842  . perphenazine (TRILAFON) tablet 8 mg  8 mg Oral BID Devra Stare B, MD   8 mg at 02/03/17 1646  . temazepam (RESTORIL) capsule 30 mg  30 mg Oral QHS Jasmon Graffam B, MD   30 mg at 02/04/17 2056  . ziprasidone (GEODON) injection 10 mg  10 mg Intramuscular Q6H PRN Ramond Dial, MD   10 mg at 02/01/17 1525    Lab Results:  No results found for this or any previous visit (from the past 31 hour(s)).  Blood Alcohol level:  Lab Results  Component Value Date   ETH <5 01/27/2017   ETH <5 31/49/7026    Metabolic Disorder Labs: Lab Results  Component Value Date   HGBA1C 4.4 (L) 01/28/2017   MPG 79.58 01/28/2017   No results found for: PROLACTIN Lab Results  Component Value Date   CHOL 113 01/28/2017   TRIG 203 (H) 01/28/2017   HDL 20 (L) 01/28/2017   CHOLHDL 5.7 01/28/2017   VLDL 41 (H) 01/28/2017   LDLCALC 52 01/28/2017    Physical Findings: AIMS:  , ,  ,  ,    CIWA:    COWS:     Musculoskeletal: Strength & Muscle Tone: within normal limits Gait & Station: normal Patient leans: N/A  Psychiatric Specialty Exam: Physical Exam  Nursing note and vitals reviewed. Psychiatric: His affect is angry, labile and inappropriate. His speech is rapid and/or pressured. He is agitated, aggressive and combative. Cognition and memory are normal. He expresses impulsivity and inappropriate judgment.    Review of Systems  Psychiatric/Behavioral:       Agitated,  threatening behavior.  All other systems reviewed and are negative.   Blood pressure (!) 151/101, pulse (!) 101, temperature 98.1 F (36.7 C), temperature source Oral, resp. rate 20, height 5\' 9"  (1.753 m), weight 88.9 kg (196 lb), SpO2 96 %.Body mass index is 28.94 kg/m.  General Appearance: Fairly Groomed  Eye Contact:  Good  Speech:  Clear and Coherent  Volume:  Normal  Mood:  occasionally irritable  Affect:  Full Range  Thought Process:  Coherent  Orientation:  Full (Time, Place, and Person)  Thought Content:  Negative  Suicidal Thoughts:  No  Homicidal Thoughts:  No  Memory:  Negative  Judgement:  Impaired  Insight:  Fair  Psychomotor Activity:  Normal  Handed:    AIMS (if indicated):     Assets:  Chief Executive Officer Social Support  ADL's:  Intact  Cognition:  WNL  Sleep:  Number of Hours: 5     Observation Level/Precautions:  Continuous Observation  Laboratory:  NA  Psychotherapy:    Medications:    Consultations:    Discharge Concerns:    Estimated LOS:  Other:       Treatment Plan Summary: Daily contact with patient to assess and evaluate symptoms and progress in treatment and Medication management   Nicholas Cole is 51 year old gentleman with a history of schizoaffective disorder and multiple hospitalizations who came to the ER with complaints of hematuria. He continues to be agitated and threatening. He stays on back hall with 1:1 sitter and security guard.   1. Psychosis. We continue Clozaril 400 mg, Trilafon at 16 mg daily for psychosis and Depakote for mood stabilization. Clozapine level is pending.   2. Agitation. Resolved.    3. Hypertension. We continue metoprolol 100 mg for HTN and tachycardia.   4. GERD. He is on Pepcid.   5. Insomnia. Restoril 30 mg is available. Slept 5 hours.  6. Metabolic syndrome monitoring. Lipid panel, TSH, hemoglobin A1c are normal.  7. EKG. Sinus rhythm, QTc 424.  8. Disposition. He will likely be  discharged to his apartment. Follow-up with [facility].  Orson Slick, MD 02/05/2017, 11:07 AM

## 2017-02-05 NOTE — Progress Notes (Signed)
Pt refuses Trilafon and Depakote

## 2017-02-05 NOTE — Progress Notes (Signed)
D: Pt denies SI/HI/AVH. Pt is pleasant and cooperative. Pt presents with flight of ideas, loose associations and blaming others. Pt will talk with Probation officer, but appears to be preoccupied with male staff. Pt stated he wanted to be back on his medications he was taking for 9 years, pt has no insight into his Tx at this time.   A: Pt was offered support and encouragement. Pt was given scheduled medications. Pt was encourage to attend groups. Q 15 minute checks were done for safety.   R:Pt attends groups and interacts well with peers and staff. Pt is taking medication. Pt has no complaints.Pt receptive to treatment and safety maintained on unit.

## 2017-02-05 NOTE — Progress Notes (Signed)
Patient ID: Nicholas Cole, male   DOB: [birth year], 51 y.o.   MRN: [id number] LCSW Group Therapy Note  02/05/2017 1 pm  Type of Therapy and Topic:  Group Therapy:  Cognitive Distortions  Participation Level:  Active   Description of Group:    Patients in this group will be introduced to the topic of cognitive distortions.  Patients will identify and describe cognitive distortions, describe the feelings these distortions create for them.  Patients will identify one or more situations in their personal life where they have cognitively distorted thinking and will verbalize challenging this cognitive distortion through positive thinking skills.  Patients will practice the skill of using positive affirmations to challenge cognitive distortions using affirmation cards.    Therapeutic Goals:  1. Patient will identify two or more cognitive distortions they have used 2. Patient will identify one or more emotions that stem from use of a cognitive distortion 3. Patient will demonstrate use of a positive affirmation to counter a cognitive distortion through discussion and/or role play. 4. Patient will describe one way cognitive distortions can be detrimental to wellness   Summary of Patient Progress:  Remains tangential and off topic, requiring frequent re-direction.  Supportive of other Patients,however would have had to ask him to leave if he had not left on his own.  Monopolizing group with tangential content.  Therapeutic Modalities:   Cognitive Behavioral Therapy Motivational Interviewing   Nicholas Saucer, LCSW 02/05/2017 4:54 PM

## 2017-02-05 NOTE — Progress Notes (Signed)
Patient is resting in bed and without distress 1:1 continues no issues with behaviors

## 2017-02-05 NOTE — [facility] Group Notes (Signed)
[place] Group Notes:  (Nursing/MHT/Case Management/Adjunct)  Date:  02/05/2017  Time:  10:16 PM  Type of Therapy:  Group Therapy  Participation Level:  Active  Participation Quality:  Appropriate  Affect:  Appropriate  Cognitive:  Appropriate  Insight:  Appropriate  Engagement in Group:  Engaged  Modes of Intervention:  Discussion  Summary of Progress/Problems:  Nicholas Cole 02/05/2017, 10:16 PM

## 2017-02-05 NOTE — Progress Notes (Signed)
Pt is tolerating being off of 1:1 well. He seems to be in a pleasant mood. He is interacting well with staff and peers. Pt is medication and meal compliant. No hostility and aggressive behaviors noted. Will continue to monitor.

## 2017-02-05 NOTE — Plan of Care (Signed)
Problem: Coping: Goal: Ability to cope will improve Outcome: Progressing Pt appears to be progressing on the unit, pt has not had any loud outburst this evening and appears to be able to interact with staff/ peers better  Problem: Safety: Goal: Periods of time without injury will increase Outcome: Progressing Pt safe on the unit at this time  Problem: Activity: Goal: Sleeping patterns will improve Outcome: Progressing Pt slept 5 hrs last night

## 2017-02-05 NOTE — Progress Notes (Signed)
Patient is in his room in bed with eyes closed , 1:1 continues with a sitter and a PSO in place noted.

## 2017-02-05 NOTE — Plan of Care (Signed)
Problem: Safety: Goal: Periods of time without injury will increase Outcome: Progressing Pt remains safe while in hospital injury free.    

## 2017-02-06 LAB — CLOZAPINE (CLOZARIL)
CLOZAPINE LVL: 448 ng/mL (ref 350–650)
NORCLOZAPINE: 189 ng/mL
Total(Cloz+Norcloz): 637 ng/mL

## 2017-02-06 LAB — VALPROIC ACID LEVEL: Valproic Acid Lvl: <10 ug/mL — ABNORMAL LOW (ref 50.0–100.0)

## 2017-02-06 NOTE — Plan of Care (Signed)
Problem: Education: Goal: Will be free of psychotic symptoms Outcome: Progressing Patient's mental condition has improved, less psychotic episodes exhibited.  Problem: Coping: Goal: Ability to verbalize feelings will improve Outcome: Progressing Patient has shown that he has the ability to verbalize his feelings appropriately.  Problem: Activity: Goal: Interest or engagement in activities will improve Outcome: Progressing Patient shows interest in and engages in activities during the day.  Problem: Coping: Goal: Ability to verbalize frustrations and anger appropriately will improve Outcome: Progressing Patient has the ability to verbalize frustrations to staff, needs to work on anger issues.   Problem: Safety: Goal: Periods of time without injury will increase Outcome: Progressing Patient remains free from injury on the unit at this.

## 2017-02-06 NOTE — Plan of Care (Signed)
Problem: Coping: Goal: Ability to cope will improve Outcome: Progressing Patient is participating in groups with peers without any issues  Problem: Safety: Goal: Periods of time without injury will increase Outcome: Progressing Patient is kept safe and secure without injury to self and others at this time

## 2017-02-06 NOTE — Progress Notes (Signed)
[facility] MD Progress Note  02/06/2017 2:52 PM Joffrey Kerce  MRN:  [id number] Subjective:    01/30/2017. He endorses that he is doing well. He is interactive with other patients on the unit. Denies any auditory or visual hallucinations. Says he had a good night's rest and slept fine.  Gets occasionally irritable with nursing staff, situations being completely unprovoked. Reported that he does not want to take the Depakote.  Care Everywhere documents that he had hyperammonemia with Depakote. Lithium was also tried but he developed lithium toxicity with renal failure. Current home medications include perphenazine, Klonopin and clozapine. There is some degree of discrepancy in the dosing of Klonopin which varies from 0.5 mg daily to 0.5 mg 3 times daily. However he would like about consolidated at night and so his nighttime dosing is currently 1.5 mg at bedtime. Was also worked up for myeloma in the past, considering he had elevated parathyroid hormone levels and abnormal immunoglobulin levels. Serum protein electrophoresis was unremarkable at [facility] nephrology.  01/31/2017. Mr. Taul has been agitated and threatening since yesterday, including multiple episodes last night. He did not participate in treatment team meeting this morning. I spoke with him during lunch which he took on the back hall with security and sitter. He was pleasant. He recognized me from previous admissions. He reported living independently for the past 7 months. It is likely that he discontinued medications a while ago. His Depakote level was low on admission but it is unclear if he was prescribed Depakote recently. He has a history of hyperammonemia on Depakote. Clozaril level was not checked on admission. Shortly after my interview, the patient started yelling screening banging on the door and received IM injection of Geodon. I will ask Dr. Weber Cooks for a second opinion and order NEFM.  02/01/2017. Mr. Trott is still agitated, threatening and  grandiose today. 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 He was given injection of haldol and benadrly this morning. He takes his meals with no problem. He has 1:1 sitter and security guard on the back hall. He slept 4 hours only with 30 mg of Restoril. He refuses Depakote. There is a history of hyperammonemia on Depakote. There is also history of Lithium toxicity.   02/02/2017. Mr. Ostermiller remains on back hall with 1:1 sitter and security guard. He is agitated and threatening. We will order styrofoam food containers as there is a risk of him throwing food tray at the staff. He has no insight into mental illness and demands immediate discharge as there is nothing wrong with him. He refuses Restoril and Depakote and stays up all night. He does accept Clozapine and Trilafon. He complains botterly about people "talking loudly all the time" but denies they are his voices.  02/03/2017. Mr. Collard seems much improved today. He is still on the back hall with one-to-one sitter and security guard but has no behavioral problems today. He slept over 8 hours last night for the first time. He takes clozapine and Trilafon and continues to refuse Depakote.  he is still without and hyperverbal but able to hold and not been physically detailed conversation. He understands that he needs to maintain good behavior to be released from the back hall and have a visit from his ACT team before discharge plans can be made. His blood pressure was elevated yesterday and he received clonidine and Hydralazine. He ate his lunch today. He has no somatic complaints. He denies hallucinations.   02/04/2017. Mr. Stollings is very pleasant this morning but  still terribly delusional and grandiose. He thinks he is the king. He took medications except for Depakote. He did have difficulties yesterday afternoon and was given prn Haldol. He did not sleep much last night again. He is talkative, pressured and loud.  02/05/2017. Mr. Ptacek found me on the unit this morning  to say hello. He is very proud to be released from "back hall". He no longer has a Actuary. There are no behavioral problems. His mood is cheerful as he understands he reached a major milestone towards discharge. He feels he is completely "fine". He takes medications with encouragement, especially Depakote that gives him stomach pain.   02/06/2017. Mr. Aldridge is very pleasant and polite today. No behavioral problems. Takes medications. We called his ACT team to reassess the patient for discharge. Still awaiting response. Will check depakote level in am.   Per nursing: D: Pt denies SI/HI/AVH. Pt is pleasant and cooperative. Pt presents with flight of ideas, loose associations and blaming others. Pt will talk with Probation officer, but appears to be preoccupied with male staff. Pt stated he wanted to be back on his medications he was taking for 9 years, pt has no insight into his Tx at this time.   A: Pt was offered support and encouragement. Pt was given scheduled medications. Pt was encourage to attend groups. Q 15 minute checks were done for safety.   R:Pt attends groups and interacts well with peers and staff. Pt is taking medication. Pt has no complaints.Pt receptive to treatment and safety maintained on unit.  Principal Problem: Schizoaffective disorder, bipolar type ([place]) Diagnosis:   Patient Active Problem List   Diagnosis Date Noted  . GERD (gastroesophageal reflux disease) [K21.9] 01/30/2017  . Chronic renal failure [N18.9] 01/27/2017  . Hypertension [I10] 01/27/2017  . Lithium toxicity [T56.891A] 11/06/2016  . ARF (acute renal failure) ([place]) [N17.9] 11/05/2016  . Dehydration [E86.0] 11/05/2016  . Rhabdomyolysis [M62.82] 11/05/2016  . Schizoaffective disorder, bipolar type ([place]) [F25.0] 11/05/2016   Total Time spent with patient: 30 minutes  Past Psychiatric History: Long-standing mental health problems diagnosis schizoaffective disorder. Multiple hospitalizations including state  hospitalizations. No history that I know of suicide attempts. Has been threatening and aggressive at times in the past. Patient is on clozapine and Depakote. I am not sure whether lithium is still being used given his worsening renal function.  Past Medical History:  Past Medical History:  Diagnosis Date  . Acute renal failure (ARF) ([place])   . Schizophrenia ([place])    History reviewed. No pertinent surgical history. Family History: History reviewed. No pertinent family history. Family Psychiatric  History:  Social History:  History  Alcohol Use No     History  Drug Use No    Social History   Social History  . Marital status: Single    Spouse name: N/A  . Number of children: N/A  . Years of education: N/A   Social History Main Topics  . Smoking status: Former Research scientist (life sciences)  . Smokeless tobacco: Never Used  . Alcohol use No  . Drug use: No  . Sexual activity: Not Asked   Other Topics Concern  . None   Social History Narrative  . None   Additional Social History:    Lives in an apartment in [place]. Endorses that his siblings are alive, but he does not keep in touch with them. Spends his day at the park at times. Says he gets his SSI check as weekly payments.  Denies any substance use currently.  Sleep: Good  Appetite:  Good  Current Medications: Current Facility-Administered Medications  Medication Dose Route Frequency Provider Last Rate Last Dose  . acetaminophen (TYLENOL) tablet 650 mg  650 mg Oral Q6H PRN Clapacs, Madie Reno, MD   650 mg at 02/01/17 0636  . alum & mag hydroxide-simeth (MAALOX/MYLANTA) 200-200-20 MG/5ML suspension 30 mL  30 mL Oral Q4H PRN Clapacs, John T, MD      . cloZAPine (CLOZARIL) tablet 200 mg  200 mg Oral BID Esmee Fallaw B, MD   200 mg at 02/06/17 0736  . diphenhydrAMINE (BENADRYL) capsule 50 mg  50 mg Oral Q6H PRN Netha Dafoe B, MD   50 mg at 02/03/17 2121   Or  . diphenhydrAMINE (BENADRYL) injection 50 mg  50 mg  Intramuscular Q6H PRN Ivon Roedel B, MD   50 mg at 02/02/17 0736  . divalproex (DEPAKOTE) DR tablet 500 mg  500 mg Oral Q12H Purl Claytor B, MD   500 mg at 02/02/17 1947  . famotidine (PEPCID) tablet 20 mg  20 mg Oral BID Clapacs, John T, MD   20 mg at 02/06/17 0736  . haloperidol (HALDOL) tablet 10 mg  10 mg Oral Q6H PRN Renny Gunnarson B, MD   10 mg at 02/03/17 2122   Or  . haloperidol lactate (HALDOL) injection 10 mg  10 mg Intramuscular Q6H PRN Janelle Spellman B, MD   10 mg at 02/02/17 0733  . LORazepam (ATIVAN) tablet 2 mg  2 mg Oral Q6H PRN Evoleth Nordmeyer B, MD   2 mg at 02/03/17 2123   Or  . LORazepam (ATIVAN) injection 2 mg  2 mg Intramuscular Q6H PRN Gini Caputo B, MD   2 mg at 02/02/17 0732  . magnesium hydroxide (MILK OF MAGNESIA) suspension 30 mL  30 mL Oral Daily PRN Clapacs, John T, MD      . metoprolol succinate (TOPROL-XL) 24 hr tablet 100 mg  100 mg Oral Daily Deontae Robson B, MD   100 mg at 02/06/17 0655  . perphenazine (TRILAFON) tablet 8 mg  8 mg Oral BID Tyiana Hill B, MD   8 mg at 02/03/17 1646  . temazepam (RESTORIL) capsule 30 mg  30 mg Oral QHS Jamiee Milholland B, MD   30 mg at 02/05/17 2209  . ziprasidone (GEODON) injection 10 mg  10 mg Intramuscular Q6H PRN Ramond Dial, MD   10 mg at 02/01/17 1525    Lab Results:  No results found for this or any previous visit (from the past 30 hour(s)).  Blood Alcohol level:  Lab Results  Component Value Date   ETH <5 01/27/2017   ETH <5 03/26/2535    Metabolic Disorder Labs: Lab Results  Component Value Date   HGBA1C 4.4 (L) 01/28/2017   MPG 79.58 01/28/2017   No results found for: PROLACTIN Lab Results  Component Value Date   CHOL 113 01/28/2017   TRIG 203 (H) 01/28/2017   HDL 20 (L) 01/28/2017   CHOLHDL 5.7 01/28/2017   VLDL 41 (H) 01/28/2017   LDLCALC 52 01/28/2017    Physical Findings: AIMS:  , ,  ,  ,    CIWA:    COWS:      Musculoskeletal: Strength & Muscle Tone: within normal limits Gait & Station: normal Patient leans: N/A  Psychiatric Specialty Exam: Physical Exam  Nursing note and vitals reviewed. Psychiatric: His affect is angry, labile and inappropriate. His speech is rapid and/or pressured. He is agitated, aggressive and combative. Cognition  and memory are normal. He expresses impulsivity and inappropriate judgment.    Review of Systems  Psychiatric/Behavioral:       Agitated, threatening behavior.  All other systems reviewed and are negative.   Blood pressure (!) 166/102, pulse 86, temperature 98.2 F (36.8 C), temperature source Oral, resp. rate 20, height 5\' 9"  (1.753 m), weight 88.9 kg (196 lb), SpO2 98 %.Body mass index is 28.94 kg/m.  General Appearance: Fairly Groomed  Eye Contact:  Good  Speech:  Clear and Coherent  Volume:  Normal  Mood:  occasionally irritable  Affect:  Full Range  Thought Process:  Coherent  Orientation:  Full (Time, Place, and Person)  Thought Content:  Negative  Suicidal Thoughts:  No  Homicidal Thoughts:  No  Memory:  Negative  Judgement:  Impaired  Insight:  Fair  Psychomotor Activity:  Normal  Handed:    AIMS (if indicated):     Assets:  Chief Executive Officer Social Support  ADL's:  Intact  Cognition:  WNL  Sleep:  Number of Hours: 7.15     Observation Level/Precautions:  Continuous Observation  Laboratory:  NA  Psychotherapy:    Medications:    Consultations:    Discharge Concerns:    Estimated LOS:  Other:       Treatment Plan Summary: Daily contact with patient to assess and evaluate symptoms and progress in treatment and Medication management   Mr. Sackmann is 51 year old gentleman with a history of schizoaffective disorder and multiple hospitalizations who came to the ER with complaints of hematuria. He continues to be agitated and threatening. He stays on back hall with 1:1 sitter and security guard.   1. Psychosis. We  continue Clozaril 400 mg, Trilafon at 16 mg daily for psychosis and Depakote for mood stabilization. Clozapine level and VPA level is pending.   2. Agitation. Resolved.    3. Hypertension. We continue metoprolol 100 mg for HTN and tachycardia.   4. GERD. He is on Pepcid.   5. Insomnia. Restoril 30 mg is available. Slept 5 hours.  6. Metabolic syndrome monitoring. Lipid panel, TSH, hemoglobin A1c are normal.  7. EKG. Sinus rhythm, QTc 424.  8. Disposition. He will likely be discharged to his apartment. Follow-up with [facility].  Orson Slick, MD 02/06/2017, 2:52 PM

## 2017-02-07 MED ORDER — PERPHENAZINE 8 MG PO TABS: 8.0000 mg | ORAL_TABLET | Freq: Two times a day (BID) | ORAL | 1 refills | Status: DC

## 2017-02-07 MED ORDER — CLOZAPINE 200 MG PO TABS
200.0000 mg | ORAL_TABLET | Freq: Two times a day (BID) | ORAL | 1 refills | Status: DC
Start: 2017-02-07 — End: 2018-01-05

## 2017-02-07 MED ORDER — METOPROLOL SUCCINATE ER 100 MG PO TB24: 100.0000 mg | ORAL_TABLET | Freq: Every day | ORAL | 1 refills | Status: DC

## 2017-02-07 MED ORDER — TEMAZEPAM 30 MG PO CAPS: 30.0000 mg | ORAL_CAPSULE | Freq: Every day | ORAL | 0 refills | Status: DC

## 2017-02-07 NOTE — BHH Suicide Risk Assessment (Signed)
[facility] Discharge Suicide Risk Assessment   Principal Problem: Schizoaffective disorder, bipolar type [facility]) Discharge Diagnoses:  Patient Active Problem List   Diagnosis Date Noted  . GERD (gastroesophageal reflux disease) [K21.9] 01/30/2017  . Chronic renal failure [N18.9] 01/27/2017  . Hypertension [I10] 01/27/2017  . Lithium toxicity [T56.891A] 11/06/2016  . ARF (acute renal failure) ([place]) [N17.9] 11/05/2016  . Dehydration [E86.0] 11/05/2016  . Rhabdomyolysis [M62.82] 11/05/2016  . Schizoaffective disorder, bipolar type ([place]) [F25.0] 11/05/2016    Total Time spent with patient: 30 minutes  Musculoskeletal: Strength & Muscle Tone: within normal limits Gait & Station: normal Patient leans: N/A  Psychiatric Specialty Exam: Review of Systems  All other systems reviewed and are negative.   Blood pressure (!) 152/101, pulse 86, temperature 98 F (36.7 C), resp. rate 20, height 5\' 9"  (1.753 m), weight 88.9 kg (196 lb), SpO2 98 %.Body mass index is 28.94 kg/m.  General Appearance: Casual  Eye Contact::  Good  Speech:  Clear and Coherent409  Volume:  Increased  Mood:  Euphoric  Affect:  Appropriate  Thought Process:  Goal Directed and Descriptions of Associations: Intact  Orientation:  Full (Time, Place, and Person)  Thought Content:  Delusions and Paranoid Ideation  Suicidal Thoughts:  No  Homicidal Thoughts:  No  Memory:  Immediate;   Fair Recent;   Fair Remote;   Fair  Judgement:  Poor  Insight:  Shallow  Psychomotor Activity:  Normal  Concentration:  Fair  Recall:  [place]: Fair  Akathisia:  No  Handed:  Right  AIMS (if indicated):     Assets:  Communication Skills Desire for Improvement Financial Resources/Insurance Housing Physical Health Resilience Social Support  Sleep:  Number of Hours: 7.3  Cognition: WNL  ADL's:  Intact   Mental Status Per Nursing Assessment::   On Admission:  NA  Demographic Factors:  Male and  Living alone  Loss Factors: NA  Historical Factors: Family history of mental illness or substance abuse and Impulsivity  Risk Reduction Factors:   Sense of responsibility to family, Positive social support and Positive therapeutic relationship  Continued Clinical Symptoms:  Schizophrenia:   Paranoid or undifferentiated type  Cognitive Features That Contribute To Risk:  None    Suicide Risk:  Minimal: No identifiable suicidal ideation.  Patients presenting with no risk factors but with morbid ruminations; may be classified as minimal risk based on the severity of the depressive symptoms  Follow-up Information    Services, Psychotherapeutic Follow up.   Contact information: [address] [place] [address] [phone]           Plan Of Care/Follow-up recommendations:  Activity:  as tolerated. Diet:  low sodium heart healthy. Other:  keep follow up appointments.  Orson Slick, MD 02/07/2017, 8:09 AM

## 2017-02-07 NOTE — Progress Notes (Signed)
Patient ID: Nicholas Cole, male   DOB: November 06, 1965, 51 y.o.   MRN: [id number] Nicholas Cole received her discharge order.Personal tems returned and checked by him. Discharge instructions and prescriptions reviewed and his  questions answered. Discharged with a male the ACT member  without incident.

## 2017-02-07 NOTE — Progress Notes (Signed)
  [facility] Adult Case Management Discharge Plan :  Will you be returning to the same living situation after discharge:  Yes,  own apartment At discharge, do you have transportation home?: Yes,  ACT team will transport Do you have the ability to pay for your medications: Yes,  medicaid/medicare  Release of information consent forms completed and in the chart;  Patient's signature needed at discharge.  Patient to Follow up at: Follow-up Information    [facility] Go on 02/07/2017.   Why:  Your ACT team will pick you up at the hospital today to resume services. Contact information: [place] [address] [phone]           Next level of care provider has access to [employer] and Suicide Prevention discussed: Yes,  with friend  Have you used any form of tobacco in the last 30 days? (Cigarettes, Smokeless Tobacco, Cigars, and/or Pipes): Yes  Has patient been referred to the Quitline?: Patient refused referral  Patient has been referred for addiction treatment: Yes  Joanne Chars, [place] 02/07/2017, 10:32 AM
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
## 2017-02-07 NOTE — Plan of Care (Signed)
Problem: Coping: Goal: Ability to cope will improve Outcome: Progressing Nicholas Cole is appropriate in the milieu with his peers.  Problem: Activity: Goal: Interest or engagement in activities will improve Outcome: Progressing Nicholas Cole is attending the group therapy sessions and behaving appropriately.

## 2017-02-16 DIAGNOSIS — M79674 Pain in right toe(s): Secondary | ICD-10-CM | POA: Diagnosis not present

## 2017-02-16 DIAGNOSIS — B351 Tinea unguium: Secondary | ICD-10-CM | POA: Diagnosis not present

## 2017-02-16 DIAGNOSIS — M79675 Pain in left toe(s): Secondary | ICD-10-CM | POA: Diagnosis not present

## 2017-03-03 DIAGNOSIS — Z79899 Other long term (current) drug therapy: Secondary | ICD-10-CM | POA: Diagnosis not present

## 2017-03-12 ENCOUNTER — Emergency Department
Admission: EM | Admit: 2017-03-12 | Discharge: 2017-03-13 | Disposition: A | Discharge status: Home / Self Care | Payer: Medicare Other | Attending: Emergency Medicine | Admitting: Emergency Medicine

## 2017-03-12 DIAGNOSIS — Z87891 Personal history of nicotine dependence: Secondary | ICD-10-CM | POA: Insufficient documentation

## 2017-03-12 DIAGNOSIS — F250 Schizoaffective disorder, bipolar type: Secondary | ICD-10-CM | POA: Diagnosis present

## 2017-03-12 DIAGNOSIS — N189 Chronic kidney disease, unspecified: Secondary | ICD-10-CM | POA: Diagnosis present

## 2017-03-12 DIAGNOSIS — K219 Gastro-esophageal reflux disease without esophagitis: Secondary | ICD-10-CM | POA: Diagnosis present

## 2017-03-12 DIAGNOSIS — Z79899 Other long term (current) drug therapy: Secondary | ICD-10-CM | POA: Diagnosis not present

## 2017-03-12 DIAGNOSIS — R443 Hallucinations, unspecified: Secondary | ICD-10-CM | POA: Diagnosis present

## 2017-03-12 DIAGNOSIS — F29 Unspecified psychosis not due to a substance or known physiological condition: Secondary | ICD-10-CM

## 2017-03-12 DIAGNOSIS — I10 Essential (primary) hypertension: Secondary | ICD-10-CM | POA: Diagnosis not present

## 2017-03-12 LAB — COMPREHENSIVE METABOLIC PANEL
ALBUMIN: 4.2 g/dL (ref 3.5–5.0)
ALK PHOS: 67 U/L (ref 38–126)
ALT: 15 U/L — ABNORMAL LOW (ref 17–63)
ANION GAP: 12 (ref 5–15)
AST: 22 U/L (ref 15–41)
BILIRUBIN TOTAL: 0.8 mg/dL (ref 0.3–1.2)
BUN: 21 mg/dL — AB (ref 6–20)
CALCIUM: 9.4 mg/dL (ref 8.9–10.3)
CO2: 24 mmol/L (ref 22–32)
CREATININE: 2.67 mg/dL — AB (ref 0.61–1.24)
Chloride: 106 mmol/L (ref 101–111)
GFR calc Af Amer: 31 mL/min — ABNORMAL LOW (ref >60)
GFR calc non Af Amer: 26 mL/min — ABNORMAL LOW (ref >60)
GLUCOSE: 98 mg/dL (ref 65–99)
Potassium: 3.8 mmol/L (ref 3.5–5.1)
Sodium: 142 mmol/L (ref 135–145)
TOTAL PROTEIN: 7.5 g/dL (ref 6.5–8.1)

## 2017-03-12 LAB — URINE DRUG SCREEN, QUALITATIVE (ARMC ONLY)
AMPHETAMINES, UR SCREEN: NOT DETECTED
BARBITURATES, UR SCREEN: NOT DETECTED
BENZODIAZEPINE, UR SCRN: POSITIVE — AB
Cannabinoid 50 Ng, Ur ~~LOC~~: NOT DETECTED
Cocaine Metabolite,Ur ~~LOC~~: NOT DETECTED
MDMA (Ecstasy)Ur Screen: NOT DETECTED
Methadone Scn, Ur: NOT DETECTED
OPIATE, UR SCREEN: NOT DETECTED
PHENCYCLIDINE (PCP) UR S: NOT DETECTED
Tricyclic, Ur Screen: NOT DETECTED

## 2017-03-12 LAB — CBC
HCT: 39.2 % — ABNORMAL LOW (ref 40.0–52.0)
HEMOGLOBIN: 13.4 g/dL (ref 13.0–18.0)
MCH: 29.4 pg (ref 26.0–34.0)
MCHC: 34.3 g/dL (ref 32.0–36.0)
MCV: 85.8 fL (ref 80.0–100.0)
PLATELETS: 195 10*3/uL (ref 150–440)
RBC: 4.57 MIL/uL (ref 4.40–5.90)
RDW: 13.6 % (ref 11.5–14.5)
WBC: 5.6 10*3/uL (ref 3.8–10.6)

## 2017-03-12 MED ORDER — PERPHENAZINE 4 MG PO TABS
8.0000 mg | ORAL_TABLET | Freq: Two times a day (BID) | ORAL | Status: DC
  Administered 2017-03-13: 8 mg via ORAL
  Filled 2017-03-12 (×2): qty 1
  Filled 2017-03-12: qty 2
  Filled 2017-03-12: qty 1

## 2017-03-12 MED ORDER — METOPROLOL SUCCINATE ER 50 MG PO TB24
100.0000 mg | ORAL_TABLET | Freq: Every day | ORAL | Status: DC
  Administered 2017-03-13: 100 mg via ORAL
  Filled 2017-03-12: qty 2

## 2017-03-12 MED ORDER — CLOZAPINE 100 MG PO TABS
200.0000 mg | ORAL_TABLET | Freq: Two times a day (BID) | ORAL | Status: DC
  Administered 2017-03-13: 200 mg via ORAL
  Filled 2017-03-12: qty 8

## 2017-03-12 MED ORDER — TEMAZEPAM 7.5 MG PO CAPS: 30.0000 mg | ORAL_CAPSULE | Freq: Every day | ORAL | Status: DC

## 2017-03-12 NOTE — ED Notes (Signed)

## 2017-03-12 NOTE — ED Triage Notes (Signed)
Patient here for evaluation of medication. States they have recently changed his medicatons and "it's making him hallucinate." Patient with flight of ideas.
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
## 2017-03-13 DIAGNOSIS — F250 Schizoaffective disorder, bipolar type: Secondary | ICD-10-CM

## 2017-03-13 LAB — CBC WITH DIFFERENTIAL/PLATELET
BASOS ABS: 0.1 10*3/uL (ref 0–0.1)
BASOS PCT: 1 %
EOS ABS: 0.1 10*3/uL (ref 0–0.7)
Eosinophils Relative: 2 %
HEMATOCRIT: 38.3 % — AB (ref 40.0–52.0)
HEMOGLOBIN: 13 g/dL (ref 13.0–18.0)
Lymphocytes Relative: 20 %
Lymphs Abs: 1.2 10*3/uL (ref 1.0–3.6)
MCH: 29.1 pg (ref 26.0–34.0)
MCHC: 34.1 g/dL (ref 32.0–36.0)
MCV: 85.3 fL (ref 80.0–100.0)
MONO ABS: 0.5 10*3/uL (ref 0.2–1.0)
Monocytes Relative: 9 %
NEUTROS ABS: 3.9 10*3/uL (ref 1.4–6.5)
NEUTROS PCT: 68 %
Platelets: 178 10*3/uL (ref 150–440)
RBC: 4.48 MIL/uL (ref 4.40–5.90)
RDW: 13.5 % (ref 11.5–14.5)
WBC: 5.8 10*3/uL (ref 3.8–10.6)

## 2017-03-13 LAB — DIFFERENTIAL
BASOS PCT: 1 %
Basophils Absolute: 0.1 10*3/uL (ref 0–0.1)
EOS ABS: 0.1 10*3/uL (ref 0–0.7)
EOS PCT: 2 %
Lymphocytes Relative: 32 %
Lymphs Abs: 1.8 10*3/uL (ref 1.0–3.6)
Monocytes Absolute: 0.5 10*3/uL (ref 0.2–1.0)
Monocytes Relative: 9 %
NEUTROS ABS: 3 10*3/uL (ref 1.4–6.5)
Neutrophils Relative %: 56 %

## 2017-03-13 LAB — ACETAMINOPHEN LEVEL: Acetaminophen (Tylenol), Serum: <10 ug/mL — ABNORMAL LOW (ref 10–30)

## 2017-03-13 LAB — ETHANOL

## 2017-03-13 LAB — LITHIUM LEVEL: Lithium Lvl: <0.06 mmol/L — ABNORMAL LOW (ref 0.60–1.20)

## 2017-03-13 LAB — SALICYLATE LEVEL: Salicylate Lvl: <7 mg/dL (ref 2.8–30.0)

## 2017-03-13 MED ORDER — HALOPERIDOL LACTATE 5 MG/ML IJ SOLN
5.0000 mg | Freq: Once | INTRAMUSCULAR | Status: AC
  Administered 2017-03-13: 5 mg via INTRAMUSCULAR

## 2017-03-13 MED ORDER — ZIPRASIDONE MESYLATE 20 MG IM SOLR
20.0000 mg | Freq: Once | INTRAMUSCULAR | Status: AC
  Administered 2017-03-13: 20 mg via INTRAMUSCULAR

## 2017-03-13 MED ORDER — HALOPERIDOL LACTATE 5 MG/ML IJ SOLN
INTRAMUSCULAR | Status: AC
  Administered 2017-03-13: 5 mg via INTRAMUSCULAR
  Filled 2017-03-13: qty 1

## 2017-03-13 MED ORDER — SODIUM CHLORIDE 0.9 % IV BOLUS (SEPSIS): 1000.0000 mL | Freq: Once | INTRAVENOUS | Status: DC

## 2017-03-13 MED ORDER — DIPHENHYDRAMINE HCL 50 MG/ML IJ SOLN
INTRAMUSCULAR | Status: AC
  Administered 2017-03-13: 50 mg via INTRAMUSCULAR
  Filled 2017-03-13: qty 1

## 2017-03-13 MED ORDER — ZIPRASIDONE MESYLATE 20 MG IM SOLR
INTRAMUSCULAR | Status: AC
  Administered 2017-03-13: 20 mg via INTRAMUSCULAR
  Filled 2017-03-13: qty 20

## 2017-03-13 MED ORDER — LORAZEPAM 2 MG/ML IJ SOLN
2.0000 mg | Freq: Once | INTRAMUSCULAR | Status: AC
  Administered 2017-03-13: 2 mg via INTRAMUSCULAR

## 2017-03-13 MED ORDER — DIPHENHYDRAMINE HCL 50 MG/ML IJ SOLN
50.0000 mg | Freq: Once | INTRAMUSCULAR | Status: AC
  Administered 2017-03-13: 50 mg via INTRAMUSCULAR

## 2017-03-13 MED ORDER — LORAZEPAM 2 MG/ML IJ SOLN
INTRAMUSCULAR | Status: AC
  Administered 2017-03-13: 2 mg via INTRAMUSCULAR
  Filled 2017-03-13: qty 1

## 2017-03-13 NOTE — ED Notes (Signed)
BEHAVIORAL HEALTH ROUNDING Patient sleeping: Yes.   Patient alert and oriented: not applicable Behavior appropriate: Yes.  ; If no, describe:  Nutrition and fluids offered: No Toileting and hygiene offered: No Sitter present: not applicable Law enforcement present: Yes  

## 2017-03-13 NOTE — ED Notes (Signed)
Patient asleep in room. No noted distress or abnormal behavior. Will continue 15 minute checks and observation by security cameras for safety. 

## 2017-03-13 NOTE — ED Notes (Signed)
Pt received dinner tray.
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
## 2017-03-13 NOTE — ED Notes (Signed)
Attempted to get patient to let me start an IV and pt refused. Pt states "I'm not taking any more medications". Explained to patient that this was IV fluids to attempt to flush the other medications out of his system but pt states "maybe later". Dr Burlene Arnt informed and ordered to DC orders and move pt to the [facility].

## 2017-03-13 NOTE — Progress Notes (Signed)
Pharmacy consulted to monitor Clozapine. Per Clozapine REMS site patient is eligible for dispensing at weekly monitoring.  03/13/17  [place]  [id number]  Next [place] due on 10/22  Charlane Ferretti, [place] 03/13/17
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
## 2017-03-13 NOTE — Discharge Instructions (Signed)
please return to the group home and continue to follow-up with your doctor and take all your medicines. Return here for any further problems
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
## 2017-03-13 NOTE — ED Notes (Signed)
Pt escorted to room 8. Calm and cooperative. Pt given orange juice, 2 blankets and went back to sleep. Maintained on 15 minute checks and observation by security camera for safety.

## 2017-03-13 NOTE — ED Provider Notes (Signed)
patient seen by Dr. Lissa Hoard packs he knows very well. Patient has returned to his baseline he is taking his medications again. We will let him go home.   Nena Polio, MD 03/13/17 [phone]

## 2017-03-13 NOTE — ED Notes (Signed)
Trilafon arrives from pharmacy. Patient states this is his new med and that he takes this. States it is another med he was worried about. States does not want to take a medication that causes abdominal bloating but that this is not the med. Juice taken. Walking around visiting with staff.

## 2017-03-13 NOTE — ED Notes (Signed)
Resting with eyes closed, resp unlabored, appears to be sleeping.

## 2017-03-13 NOTE — ED Notes (Signed)
Lunch tray placed in room.

## 2017-03-13 NOTE — BH Assessment (Signed)
Writer spoke patient's ACT Team, [employer] ([phone]).

## 2017-03-13 NOTE — ED Notes (Signed)

## 2017-03-13 NOTE — ED Notes (Signed)
Pt continues to be agitated acting and saying hateful things to the [employer] officers.
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
## 2017-03-13 NOTE — ED Notes (Signed)
Report to Amy, patient prepared for transport to [place] [facility]

## 2017-03-13 NOTE — ED Notes (Signed)
BEHAVIORAL HEALTH ROUNDING  Patient sleeping: No.  Patient alert and oriented: yes  Behavior appropriate: Yes. ; If no, describe:  Nutrition and fluids offered: Yes  Toileting and hygiene offered: Yes  Sitter present: not applicable, Q 15 min safety rounds and observation.  Law enforcement present: Yes [employer]  Pt in bathroom at this time.

## 2017-03-13 NOTE — ED Notes (Signed)
Takes po meds with juice. States he does not want any new meds. He wants his Clozaril and blood pressure meds. Given at this time. Visiting with officers.

## 2017-03-13 NOTE — ED Notes (Signed)
BEHAVIORAL HEALTH ROUNDING  Patient sleeping: No.  Patient alert and oriented: yes  Behavior appropriate: Yes. ; If no, describe:  Nutrition and fluids offered: Yes  Toileting and hygiene offered: Yes  Sitter present: not applicable, Q 15 min safety rounds and observation.  Law enforcement present: Yes [employer]  

## 2017-03-13 NOTE — ED Notes (Signed)
Report called to Joycelyn Schmid, RN in ED [facility]

## 2017-03-13 NOTE — ED Notes (Signed)
Pt up to bathroom and returned to bed.

## 2017-03-13 NOTE — ED Notes (Signed)
BEHAVIORAL HEALTH ROUNDING  Patient sleeping: No.  Patient alert and oriented: yes  Behavior appropriate: no; If no, describe: pt talking making statements about people being racist and starting race wars. Speech remains pressured.  Nutrition and fluids offered: Yes  Toileting and hygiene offered: Yes  Sitter present: not applicable, Q 15 min safety rounds and observation.  Law enforcement present: Yes [employer]

## 2017-03-13 NOTE — ED Notes (Signed)
Pt denies SI/HI/AVH. Pt given discharge instructions. Pt states understanding. Pt states receipt of all belongings.

## 2017-03-13 NOTE — ED Notes (Signed)
IVC  PAPERS  RESCINDED  PER  DR  CLAPACS  PENDING  D/C

## 2017-03-13 NOTE — ED Notes (Signed)
BEHAVIORAL HEALTH ROUNDING Patient sleeping: No. Patient alert and oriented: yes Behavior appropriate: Yes.  ; If no, describe:  Nutrition and fluids offered: Yes  Toileting and hygiene offered: Yes  Sitter present: not applicable Law enforcement present: Yes  

## 2017-04-24 DIAGNOSIS — Z79899 Other long term (current) drug therapy: Secondary | ICD-10-CM | POA: Diagnosis not present

## 2017-05-02 DIAGNOSIS — Z79899 Other long term (current) drug therapy: Secondary | ICD-10-CM | POA: Diagnosis not present

## 2017-05-15 DIAGNOSIS — Z79899 Other long term (current) drug therapy: Secondary | ICD-10-CM | POA: Diagnosis not present

## 2017-06-06 DIAGNOSIS — B351 Tinea unguium: Secondary | ICD-10-CM | POA: Diagnosis not present

## 2017-06-06 DIAGNOSIS — M79675 Pain in left toe(s): Secondary | ICD-10-CM | POA: Diagnosis not present

## 2017-06-06 DIAGNOSIS — M79674 Pain in right toe(s): Secondary | ICD-10-CM | POA: Diagnosis not present

## 2017-06-22 DIAGNOSIS — Z79899 Other long term (current) drug therapy: Secondary | ICD-10-CM | POA: Diagnosis not present

## 2017-06-28 DIAGNOSIS — Z79899 Other long term (current) drug therapy: Secondary | ICD-10-CM | POA: Diagnosis not present

## 2017-07-17 DIAGNOSIS — Z79899 Other long term (current) drug therapy: Secondary | ICD-10-CM | POA: Diagnosis not present

## 2017-07-27 DIAGNOSIS — Z79899 Other long term (current) drug therapy: Secondary | ICD-10-CM | POA: Diagnosis not present

## 2017-08-25 DIAGNOSIS — Z79899 Other long term (current) drug therapy: Secondary | ICD-10-CM | POA: Diagnosis not present

## 2017-09-08 DIAGNOSIS — N183 Chronic kidney disease, stage 3 (moderate): Secondary | ICD-10-CM | POA: Diagnosis not present

## 2017-09-22 DIAGNOSIS — Z79899 Other long term (current) drug therapy: Secondary | ICD-10-CM | POA: Diagnosis not present

## 2017-09-28 DIAGNOSIS — M79675 Pain in left toe(s): Secondary | ICD-10-CM | POA: Diagnosis not present

## 2017-09-28 DIAGNOSIS — B351 Tinea unguium: Secondary | ICD-10-CM | POA: Diagnosis not present

## 2017-09-28 DIAGNOSIS — M79674 Pain in right toe(s): Secondary | ICD-10-CM | POA: Diagnosis not present

## 2017-12-06 ENCOUNTER — Inpatient Hospital Stay: Payer: Medicare Other

## 2017-12-06 ENCOUNTER — Emergency Department: Payer: Medicare Other

## 2017-12-06 ENCOUNTER — Other Ambulatory Visit: Payer: Self-pay

## 2017-12-06 ENCOUNTER — Inpatient Hospital Stay
Admission: EM | Admit: 2017-12-06 | Discharge: 2017-12-12 | DRG: 637 | Disposition: A | Discharge status: Home Health | Payer: Medicare Other | Attending: Internal Medicine | Admitting: Internal Medicine

## 2017-12-06 ENCOUNTER — Encounter: Payer: Self-pay | Admitting: Emergency Medicine

## 2017-12-06 DIAGNOSIS — R404 Transient alteration of awareness: Secondary | ICD-10-CM | POA: Diagnosis not present

## 2017-12-06 DIAGNOSIS — E876 Hypokalemia: Secondary | ICD-10-CM | POA: Diagnosis not present

## 2017-12-06 DIAGNOSIS — Z888 Allergy status to other drugs, medicaments and biological substances status: Secondary | ICD-10-CM | POA: Diagnosis not present

## 2017-12-06 DIAGNOSIS — N179 Acute kidney failure, unspecified: Secondary | ICD-10-CM | POA: Diagnosis not present

## 2017-12-06 DIAGNOSIS — E873 Alkalosis: Secondary | ICD-10-CM | POA: Diagnosis not present

## 2017-12-06 DIAGNOSIS — Z87891 Personal history of nicotine dependence: Secondary | ICD-10-CM | POA: Diagnosis not present

## 2017-12-06 DIAGNOSIS — G9340 Encephalopathy, unspecified: Secondary | ICD-10-CM | POA: Diagnosis not present

## 2017-12-06 DIAGNOSIS — F319 Bipolar disorder, unspecified: Secondary | ICD-10-CM | POA: Diagnosis present

## 2017-12-06 DIAGNOSIS — G9341 Metabolic encephalopathy: Secondary | ICD-10-CM | POA: Diagnosis present

## 2017-12-06 DIAGNOSIS — E1100 Type 2 diabetes mellitus with hyperosmolarity without nonketotic hyperglycemic-hyperosmolar coma (NKHHC): Secondary | ICD-10-CM

## 2017-12-06 DIAGNOSIS — F259 Schizoaffective disorder, unspecified: Secondary | ICD-10-CM | POA: Diagnosis present

## 2017-12-06 DIAGNOSIS — E870 Hyperosmolality and hypernatremia: Secondary | ICD-10-CM | POA: Diagnosis present

## 2017-12-06 DIAGNOSIS — I129 Hypertensive chronic kidney disease with stage 1 through stage 4 chronic kidney disease, or unspecified chronic kidney disease: Secondary | ICD-10-CM | POA: Diagnosis present

## 2017-12-06 DIAGNOSIS — N183 Chronic kidney disease, stage 3 (moderate): Secondary | ICD-10-CM | POA: Diagnosis present

## 2017-12-06 DIAGNOSIS — E1122 Type 2 diabetes mellitus with diabetic chronic kidney disease: Secondary | ICD-10-CM | POA: Diagnosis present

## 2017-12-06 DIAGNOSIS — G9349 Other encephalopathy: Secondary | ICD-10-CM | POA: Diagnosis not present

## 2017-12-06 DIAGNOSIS — E0810 Diabetes mellitus due to underlying condition with ketoacidosis without coma: Secondary | ICD-10-CM | POA: Diagnosis not present

## 2017-12-06 DIAGNOSIS — R571 Hypovolemic shock: Secondary | ICD-10-CM | POA: Diagnosis present

## 2017-12-06 DIAGNOSIS — E1110 Type 2 diabetes mellitus with ketoacidosis without coma: Principal | ICD-10-CM | POA: Diagnosis present

## 2017-12-06 DIAGNOSIS — E871 Hypo-osmolality and hyponatremia: Secondary | ICD-10-CM | POA: Diagnosis not present

## 2017-12-06 DIAGNOSIS — E872 Acidosis: Secondary | ICD-10-CM | POA: Diagnosis not present

## 2017-12-06 DIAGNOSIS — N189 Chronic kidney disease, unspecified: Secondary | ICD-10-CM | POA: Diagnosis not present

## 2017-12-06 DIAGNOSIS — Z9111 Patient's noncompliance with dietary regimen: Secondary | ICD-10-CM | POA: Diagnosis not present

## 2017-12-06 DIAGNOSIS — J9811 Atelectasis: Secondary | ICD-10-CM | POA: Diagnosis not present

## 2017-12-06 DIAGNOSIS — R739 Hyperglycemia, unspecified: Secondary | ICD-10-CM | POA: Diagnosis not present

## 2017-12-06 HISTORY — DX: Type 2 diabetes mellitus without complications: E11.9

## 2017-12-06 LAB — GLUCOSE, CAPILLARY
GLUCOSE-CAPILLARY: 306 mg/dL — AB (ref 70–99)
Glucose-Capillary: >600 mg/dL (ref 70–99)
Glucose-Capillary: >600 mg/dL (ref 70–99)
Glucose-Capillary: >600 mg/dL (ref 70–99)
Glucose-Capillary: >600 mg/dL (ref 70–99)
Glucose-Capillary: >600 mg/dL (ref 70–99)
Glucose-Capillary: >600 mg/dL (ref 70–99)
Glucose-Capillary: 549 mg/dL (ref 70–99)
Glucose-Capillary: 478 mg/dL — ABNORMAL HIGH (ref 70–99)
Glucose-Capillary: 372 mg/dL — ABNORMAL HIGH (ref 70–99)

## 2017-12-06 LAB — COMPREHENSIVE METABOLIC PANEL
ALBUMIN: 3.9 g/dL (ref 3.5–5.0)
ALK PHOS: 88 U/L (ref 38–126)
ALT: 13 U/L (ref 0–44)
AST: 16 U/L (ref 15–41)
Anion gap: 18 — ABNORMAL HIGH (ref 5–15)
BUN: 73 mg/dL — AB (ref 6–20)
CALCIUM: 8.6 mg/dL — AB (ref 8.9–10.3)
CHLORIDE: 81 mmol/L — AB (ref 98–111)
CO2: 21 mmol/L — AB (ref 22–32)
CREATININE: 6.16 mg/dL — AB (ref 0.61–1.24)
GFR calc Af Amer: 11 mL/min — ABNORMAL LOW (ref >60)
GFR calc non Af Amer: 10 mL/min — ABNORMAL LOW (ref >60)
GLUCOSE: 945 mg/dL — AB (ref 70–99)
Potassium: 4.2 mmol/L (ref 3.5–5.1)
Sodium: 120 mmol/L — ABNORMAL LOW (ref 135–145)
Total Bilirubin: 1.7 mg/dL — ABNORMAL HIGH (ref 0.3–1.2)
Total Protein: 7.1 g/dL (ref 6.5–8.1)

## 2017-12-06 LAB — TROPONIN I: Troponin I: 0.03 ng/mL (ref <0.03)

## 2017-12-06 LAB — BASIC METABOLIC PANEL
ANION GAP: 11 (ref 5–15)
ANION GAP: 11 (ref 5–15)
BUN: 62 mg/dL — ABNORMAL HIGH (ref 6–20)
BUN: 59 mg/dL — ABNORMAL HIGH (ref 6–20)
CALCIUM: 9.1 mg/dL (ref 8.9–10.3)
CO2: 23 mmol/L (ref 22–32)
CO2: 22 mmol/L (ref 22–32)
CREATININE: 5.22 mg/dL — AB (ref 0.61–1.24)
Calcium: 9.4 mg/dL (ref 8.9–10.3)
Chloride: 104 mmol/L (ref 98–111)
Chloride: 113 mmol/L — ABNORMAL HIGH (ref 98–111)
Creatinine, Ser: 5.11 mg/dL — ABNORMAL HIGH (ref 0.61–1.24)
GFR, EST AFRICAN AMERICAN: 14 mL/min — AB (ref >60)
GFR, EST AFRICAN AMERICAN: 14 mL/min — AB (ref >60)
GFR, EST NON AFRICAN AMERICAN: 12 mL/min — AB (ref >60)
GFR, EST NON AFRICAN AMERICAN: 12 mL/min — AB (ref >60)
GLUCOSE: 727 mg/dL — AB (ref 70–99)
Glucose, Bld: 1137 mg/dL (ref 70–99)
POTASSIUM: 3 mmol/L — AB (ref 3.5–5.1)
Potassium: 2.7 mmol/L — CL (ref 3.5–5.1)
SODIUM: 138 mmol/L (ref 135–145)
Sodium: 146 mmol/L — ABNORMAL HIGH (ref 135–145)

## 2017-12-06 LAB — URINALYSIS, COMPLETE (UACMP) WITH MICROSCOPIC
BILIRUBIN URINE: NEGATIVE
KETONES UR: 5 mg/dL — AB
LEUKOCYTES UA: NEGATIVE
NITRITE: NEGATIVE
PH: 6 (ref 5.0–8.0)
Protein, ur: NEGATIVE mg/dL
Specific Gravity, Urine: 1.025 (ref 1.005–1.030)
Squamous Epithelial / LPF: NONE SEEN (ref 0–5)

## 2017-12-06 LAB — BLOOD GAS, VENOUS
Acid-base deficit: 7.3 mmol/L — ABNORMAL HIGH (ref 0.0–2.0)
BICARBONATE: 20.1 mmol/L (ref 20.0–28.0)
O2 SAT: 51.1 %
PCO2 VEN: 47 mmHg (ref 44.0–60.0)
Patient temperature: 37
pH, Ven: 7.24 — ABNORMAL LOW (ref 7.250–7.430)
pO2, Ven: 33 mmHg (ref 32.0–45.0)

## 2017-12-06 LAB — CBC WITH DIFFERENTIAL/PLATELET
BASOS PCT: 0 %
Basophils Absolute: 0 10*3/uL (ref 0–0.1)
Eosinophils Absolute: 0 10*3/uL (ref 0–0.7)
Eosinophils Relative: 0 %
HEMATOCRIT: 46.7 % (ref 40.0–52.0)
HEMOGLOBIN: 13.8 g/dL (ref 13.0–18.0)
Lymphocytes Relative: 4 %
Lymphs Abs: 0.3 10*3/uL — ABNORMAL LOW (ref 1.0–3.6)
MCH: 30 pg (ref 26.0–34.0)
MCHC: 29.7 g/dL — ABNORMAL LOW (ref 32.0–36.0)
MCV: 101 fL — ABNORMAL HIGH (ref 80.0–100.0)
MONO ABS: 0.6 10*3/uL (ref 0.2–1.0)
Monocytes Relative: 9 %
NEUTROS ABS: 6 10*3/uL (ref 1.4–6.5)
NEUTROS PCT: 87 %
Platelets: 105 10*3/uL — ABNORMAL LOW (ref 150–440)
RBC: 4.62 MIL/uL (ref 4.40–5.90)
RDW: 15.8 % — AB (ref 11.5–14.5)
WBC: 6.9 10*3/uL (ref 3.8–10.6)

## 2017-12-06 LAB — MRSA PCR SCREENING: MRSA BY PCR: NEGATIVE

## 2017-12-06 LAB — URINE DRUG SCREEN, QUALITATIVE (ARMC ONLY)
Amphetamines, Ur Screen: NOT DETECTED
BENZODIAZEPINE, UR SCRN: POSITIVE — AB
Cannabinoid 50 Ng, Ur ~~LOC~~: NOT DETECTED
Cocaine Metabolite,Ur ~~LOC~~: NOT DETECTED
MDMA (Ecstasy)Ur Screen: NOT DETECTED
METHADONE SCREEN, URINE: NOT DETECTED
OPIATE, UR SCREEN: NOT DETECTED
PHENCYCLIDINE (PCP) UR S: NOT DETECTED
Tricyclic, Ur Screen: NOT DETECTED

## 2017-12-06 LAB — ETHANOL: Alcohol, Ethyl (B): <10 mg/dL (ref <10)

## 2017-12-06 LAB — ACETAMINOPHEN LEVEL: Acetaminophen (Tylenol), Serum: <10 ug/mL — ABNORMAL LOW (ref 10–30)

## 2017-12-06 LAB — CK: CK TOTAL: 702 U/L — AB (ref 49–397)

## 2017-12-06 LAB — VALPROIC ACID LEVEL: VALPROIC ACID LVL: 71 ug/mL (ref 50.0–100.0)

## 2017-12-06 LAB — BRAIN NATRIURETIC PEPTIDE: B Natriuretic Peptide: 87 pg/mL (ref 0.0–100.0)

## 2017-12-06 LAB — LACTIC ACID, PLASMA
LACTIC ACID, VENOUS: 3.1 mmol/L — AB (ref 0.5–1.9)
Lactic Acid, Venous: 1.9 mmol/L (ref 0.5–1.9)

## 2017-12-06 LAB — LITHIUM LEVEL

## 2017-12-06 MED ORDER — ONDANSETRON HCL 4 MG/2ML IJ SOLN: 4.0000 mg | Freq: Four times a day (QID) | INTRAMUSCULAR | Status: DC | PRN

## 2017-12-06 MED ORDER — SODIUM CHLORIDE 0.9 % IV BOLUS
1000.0000 mL | Freq: Once | INTRAVENOUS | Status: AC
  Administered 2017-12-06: 1000 mL via INTRAVENOUS

## 2017-12-06 MED ORDER — SODIUM CHLORIDE 0.9 % IV SOLN: INTRAVENOUS | Status: AC

## 2017-12-06 MED ORDER — PRAZOSIN HCL 2 MG PO CAPS
2.0000 mg | ORAL_CAPSULE | Freq: Every day | ORAL | Status: DC
  Administered 2017-12-07 – 2017-12-12 (×4): 2 mg via ORAL
  Filled 2017-12-06 (×6): qty 1

## 2017-12-06 MED ORDER — DEXTROSE-NACL 5-0.45 % IV SOLN
INTRAVENOUS | Status: DC
  Administered 2017-12-07: 01:00:00 via INTRAVENOUS

## 2017-12-06 MED ORDER — SODIUM CHLORIDE 0.9 % IV SOLN
INTRAVENOUS | Status: DC
  Administered 2017-12-06: 17:00:00 via INTRAVENOUS

## 2017-12-06 MED ORDER — POTASSIUM CHLORIDE 10 MEQ/100ML IV SOLN
10.0000 meq | INTRAVENOUS | Status: AC
  Administered 2017-12-06 (×2): 10 meq via INTRAVENOUS
  Filled 2017-12-06 (×2): qty 100

## 2017-12-06 MED ORDER — SODIUM CHLORIDE 0.9 % IV SOLN
INTRAVENOUS | Status: DC
  Administered 2017-12-06: 19.6 [IU]/h via INTRAVENOUS
  Filled 2017-12-06 (×2): qty 1

## 2017-12-06 MED ORDER — HEPARIN SODIUM (PORCINE) 5000 UNIT/ML IJ SOLN
5000.0000 [IU] | Freq: Three times a day (TID) | INTRAMUSCULAR | Status: DC
  Administered 2017-12-06 – 2017-12-12 (×18): 5000 [IU] via SUBCUTANEOUS
  Filled 2017-12-06 (×18): qty 1

## 2017-12-06 MED ORDER — VITAMIN D (ERGOCALCIFEROL) 1.25 MG (50000 UNIT) PO CAPS
50000.0000 [IU] | ORAL_CAPSULE | ORAL | Status: DC
  Administered 2017-12-08: 50000 [IU] via ORAL
  Filled 2017-12-06: qty 1

## 2017-12-06 MED ORDER — INSULIN ASPART 100 UNIT/ML ~~LOC~~ SOLN
6.0000 [IU] | Freq: Once | SUBCUTANEOUS | Status: AC
  Administered 2017-12-06: 6 [IU] via INTRAVENOUS

## 2017-12-06 MED ORDER — ACETAMINOPHEN 325 MG PO TABS: 650.0000 mg | ORAL_TABLET | Freq: Four times a day (QID) | ORAL | Status: DC | PRN

## 2017-12-06 MED ORDER — DIVALPROEX SODIUM 500 MG PO DR TAB
500.0000 mg | DELAYED_RELEASE_TABLET | Freq: Two times a day (BID) | ORAL | Status: DC
  Administered 2017-12-06 – 2017-12-12 (×12): 500 mg via ORAL
  Filled 2017-12-06 (×14): qty 1

## 2017-12-06 MED ORDER — CLOZAPINE 100 MG PO TABS
200.0000 mg | ORAL_TABLET | Freq: Two times a day (BID) | ORAL | Status: DC
  Administered 2017-12-06 – 2017-12-12 (×12): 200 mg via ORAL
  Filled 2017-12-06 (×14): qty 2

## 2017-12-06 MED ORDER — ACETAMINOPHEN 650 MG RE SUPP: 650.0000 mg | Freq: Four times a day (QID) | RECTAL | Status: DC | PRN

## 2017-12-06 MED ORDER — PERPHENAZINE 4 MG PO TABS
8.0000 mg | ORAL_TABLET | Freq: Two times a day (BID) | ORAL | Status: DC
  Administered 2017-12-06 – 2017-12-12 (×12): 8 mg via ORAL
  Filled 2017-12-06 (×14): qty 2

## 2017-12-06 MED ORDER — SODIUM CHLORIDE 0.9 % IV SOLN
INTRAVENOUS | Status: DC
  Administered 2017-12-06: 5.4 [IU]/h via INTRAVENOUS
  Filled 2017-12-06: qty 1

## 2017-12-06 MED ORDER — TEMAZEPAM 15 MG PO CAPS
30.0000 mg | ORAL_CAPSULE | Freq: Every day | ORAL | Status: DC
  Administered 2017-12-06 – 2017-12-11 (×6): 30 mg via ORAL
  Filled 2017-12-06 (×3): qty 2
  Filled 2017-12-06: qty 4
  Filled 2017-12-06 (×3): qty 2

## 2017-12-06 MED ORDER — ONDANSETRON HCL 4 MG PO TABS: 4.0000 mg | ORAL_TABLET | Freq: Four times a day (QID) | ORAL | Status: DC | PRN

## 2017-12-06 NOTE — ED Triage Notes (Signed)
Pt comes into the ED via [facility] from his apartment where he was found to be unable to answer questions, lethargic, and a sugar over 600.  Patient lives independently but normally speaks to his ACT team everyday.  Last time someone has spoken to him was Monday.  Patient not answering questions for [place] at this time.

## 2017-12-06 NOTE — Consult Note (Signed)
MEDICATION RELATED CONSULT NOTE - INITIAL   Pharmacy Consult for clozapine lab monitoring and REMS Program reporting. Indication: Schizophrenia   Allergies  Allergen Reactions  . Valproic Acid Rash    hyperammonemia    Patient Measurements: Height: 5\' 11"  (180.3 cm) Weight: 190 lb 4.1 oz (86.3 kg) IBW/kg (Calculated) : 75.3  Vital Signs: Temp: 97.5 F (36.4 C) (07/10 1600) Temp Source: Axillary (07/10 1600) BP: 95/63 (07/10 1600) Pulse Rate: 63 (07/10 1600) Intake/Output from previous day: No intake/output data recorded. Intake/Output from this shift: Total I/O In: 3402.4 [I.V.:41.2; IV Piggyback:3361.2] Out: -   Labs: Recent Labs    12/06/17 1118  WBC 6.9  HGB 13.8  HCT 46.7  PLT 105*  CREATININE 6.16*  ALBUMIN 3.9  PROT 7.1  AST 16  ALT 13  ALKPHOS 88  BILITOT 1.7*   Estimated Creatinine Clearance: 15.3 mL/min (A) (by C-G formula based on SCr of 6.16 mg/dL (H)).   Microbiology: No results found for this or any previous visit (from the past 720 hour(s)).  Medical History: Past Medical History:  Diagnosis Date  . Acute renal failure (ARF) ([place])   . Diabetes mellitus without complication ([place])   . Schizophrenia [facility])     Assessment: Pharmacy consulted for Clozapine lab monitoring and REMS Program reporting in 52 yo M with PMH of schizophrenia. Patient was taking clozapine 200 mg BID PTA.    Plan:  12/06/2017 ANC 6000, lab has been reported to online Clozapine REMS Program. Patient is eligible to receive Clozapine with weekly lab monitoring. Next CBC/Diff 12/13/2017.  Paticia Stack, PharmD Pharmacy Resident  12/06/2017 4:40 PM

## 2017-12-06 NOTE — H&P (Signed)
[facility] at [place] NAME: Dillion Stowers    MR#:  [id number]  DATE OF BIRTH:  Jul 06, 1965  DATE OF ADMISSION:  12/06/2017  PRIMARY CARE PHYSICIAN: Lorelee Market, MD   REQUESTING/REFERRING PHYSICIAN: Dr. Conni Slipper  CHIEF COMPLAINT:   Chief Complaint  Patient presents with  . Hyperglycemia    HISTORY OF PRESENT ILLNESS:  Naod Sweetland  is a 52 y.o. male with a known history of schizophrenia, bipolar disorder, hypertension, previous history of lithium toxicity who presents to the hospital due to altered mental status and noted to be severely hyperglycemic.  Patient is somewhat confused and therefore most of the history obtained from the chart, ER physician also patient's friend over the phone.  As per the friend who I spoke to on the phone patient has been more more confused and lethargic and she noticed that at church this past Sunday.  The patient informed his friend that they have been changing his psych medications and that sweats affecting his lethargy and weakness.  He presented to the ER and was noted to be in acute diabetic ketoacidosis with blood sugars greater than 900, noted to be in acute on chronic renal failure.  Hospitalist services were contacted for treatment evaluation.  PAST MEDICAL HISTORY:   Past Medical History:  Diagnosis Date  . Acute renal failure (ARF) ([place])   . Diabetes mellitus without complication ([place])   . Schizophrenia ([place])     PAST SURGICAL HISTORY:  History reviewed. No pertinent surgical history.  SOCIAL HISTORY:   Social History   Tobacco Use  . Smoking status: Former Smoker    Types: Cigars  . Smokeless tobacco: Never Used  Substance Use Topics  . Alcohol use: No    FAMILY HISTORY:  No family history on file.  DRUG ALLERGIES:   Allergies  Allergen Reactions  . Valproic Acid Rash    hyperammonemia    REVIEW OF SYSTEMS:   Review of Systems  Unable to perform ROS: Mental acuity     MEDICATIONS AT HOME:   Prior to Admission medications   Medication Sig Start Date End Date Taking? Authorizing Provider  amLODipine (NORVASC) 5 MG tablet Take 1 tablet by mouth daily. 12/01/17  Yes [provider]  chlorthalidone (HYGROTON) 25 MG tablet Take 1 tablet by mouth daily. 12/01/17  Yes [provider]  cloZAPine (CLOZARIL) 200 MG tablet Take 1 tablet (200 mg total) by mouth 2 (two) times daily. 02/07/17  Yes Pucilowska, Jolanta B, MD  divalproex (DEPAKOTE) 500 MG DR tablet Take 1 tablet by mouth 2 (two) times daily. 12/01/17  Yes [provider]  metoprolol succinate (TOPROL-XL) 100 MG 24 hr tablet Take 1 tablet (100 mg total) by mouth daily. 02/07/17  Yes Pucilowska, Jolanta B, MD  perphenazine (TRILAFON) 8 MG tablet Take 1 tablet (8 mg total) by mouth 2 (two) times daily. 02/07/17  Yes Pucilowska, Jolanta B, MD  prazosin (MINIPRESS) 2 MG capsule Take 1 capsule by mouth daily. 12/01/17  Yes [provider]  temazepam (RESTORIL) 30 MG capsule Take 1 capsule (30 mg total) by mouth at bedtime. 02/07/17  Yes Pucilowska, Jolanta B, MD  Vitamin D, Ergocalciferol, (DRISDOL) 50000 units CAPS capsule Take 1 capsule by mouth once a week.   Yes [provider]      VITAL SIGNS:  Blood pressure 99/73, pulse 68, temperature 99.1 F (37.3 C), temperature source Axillary, resp. rate 13, height 5\' 11"  (1.803 m), weight 86.3  kg (190 lb 4.1 oz), SpO2 94 %.  PHYSICAL EXAMINATION:  Physical Exam  GENERAL:  52 y.o.-year-old patient lying in the bed in no acute distress.  EYES: Pupils equal, round, reactive to light and accommodation. No scleral icterus. Extraocular muscles intact.  HEENT: Head atraumatic, normocephalic. Oropharynx and nasopharynx clear. No oropharyngeal erythema, dry Oral Mucosa.   NECK:  Supple, no jugular venous distention. No thyroid enlargement, no tenderness.  LUNGS: Normal breath sounds bilaterally, no wheezing, rales, rhonchi. No use of  accessory muscles of respiration.  CARDIOVASCULAR: S1, S2 RRR. No murmurs, rubs, gallops, clicks.  ABDOMEN: Soft, nontender, nondistended. Bowel sounds present. No organomegaly or mass.  EXTREMITIES: No pedal edema, cyanosis, or clubbing. + 2 pedal & radial pulses b/l.   NEUROLOGIC: Cranial nerves II through XII are intact. No focal Motor or sensory deficits appreciated b/l PSYCHIATRIC: The patient is alert and oriented x 1.  SKIN: No obvious rash, lesion, or ulcer.   LABORATORY PANEL:   CBC Recent Labs  Lab 12/06/17 1118  WBC 6.9  HGB 13.8  HCT 46.7  PLT 105*   ------------------------------------------------------------------------------------------------------------------  Chemistries  Recent Labs  Lab 12/06/17 1118  NA 120*  K 4.2  CL 81*  CO2 21*  GLUCOSE 945*  BUN 73*  CREATININE 6.16*  CALCIUM 8.6*  AST 16  ALT 13  ALKPHOS 88  BILITOT 1.7*   ------------------------------------------------------------------------------------------------------------------  Cardiac Enzymes Recent Labs  Lab 12/06/17 1118  TROPONINI 0.03*   ------------------------------------------------------------------------------------------------------------------  RADIOLOGY:  Ct Head Wo Contrast  Result Date: 12/06/2017 CLINICAL DATA:  Altered mental status with lethargy and hyperglycemia. History of diabetes and renal failure. EXAM: CT HEAD WITHOUT CONTRAST TECHNIQUE: Contiguous axial images were obtained from the base of the skull through the vertex without intravenous contrast. COMPARISON:  CT head 11/05/2016. FINDINGS: Brain: There is no evidence of acute intracranial hemorrhage, mass lesion, brain edema or extra-axial fluid collection. The ventricles and subarachnoid spaces are appropriately sized for age. There is no CT evidence of acute cortical infarction. Vascular:  No hyperdense vessel identified. Skull: Negative for fracture or focal lesion. Sinuses/Orbits: The visualized  paranasal sinuses and mastoid air cells are clear. No orbital abnormalities are seen. Other: None. IMPRESSION: Stable unremarkable noncontrast head CT. Electronically Signed   By: Richardean Sale M.D.   On: 12/06/2017 12:58   Dg Chest Portable 1 View  Result Date: 12/06/2017 CLINICAL DATA:  Lethargy, unable to answer questions, hyperglycemia, diabetes mellitus, chronic renal failure, former smoker EXAM: PORTABLE CHEST 1 VIEW COMPARISON:  Portable exam 1140 hours compared to 11/16/2016 FINDINGS: Normal heart size, mediastinal contours, and pulmonary vascularity. Bibasilar atelectasis. Lungs otherwise clear. No pulmonary infiltrate, pleural effusion or pneumothorax. Osseous structures unremarkable. IMPRESSION: Bibasilar atelectasis. Electronically Signed   By: Lavonia Dana M.D.   On: 12/06/2017 11:50     IMPRESSION AND PLAN:   52 year old male with past medical history of schizophrenia, bipolar disorder, previous history of lithium toxicity, chronic kidney disease stage III, essential hypertension who presents to the hospital due to altered mental status and noted to be in acute kidney injury with diabetic ketoacidosis.  1.  Altered mental status- metabolic encephalopathy secondary to underlying DKA, acute kidney injury. -CT head is negative for acute pathology.  Aggressively hydrate the patient with IV fluids, placed on insulin drip.  Follow electrolytes, continue to follow mental status.  2.  Acute DKA-this is new onset diabetes as patient has no previous history of it and is not on any medications presently. - We  will place the patient on DKA protocol insulin drip, give IV fluids.  Repeat electrolytes, patient will be admitted to the stepdown level of care.  3.  Acute on chronic renal failure-patient's baseline creatinine is around 1.9-2, currently creatinines as high as 6.  This is secondary to severe dehydration also from acute DKA. - We will hydrate the patient with IV fluids, follow BUN and  creatinine.  I will also get a nephrology consult.  4.  Hyponatremia-this is pseudohyponatremia secondary to the severe hyperglycemia- should improve and correct once patient's blood sugars improved.  Continue to follow sodium.  5.  Essential hypertension- given the patient's acute dehydration and DKA we will hold patient's blood pressure meds including chlorthalidone, amlodipine and Toprol.  6.  History of bipolar disorder/schizophrenia-continue Depakote, Clozaril - Will likely need a psychiatric consult for med management at some point.    All the records are reviewed and case discussed with ED provider. Management plans discussed with the patient, family and they are in agreement.  CODE STATUS: Full code  TOTAL Critical Care TIME TAKING CARE OF THIS PATIENT: 45 minutes.    Henreitta Leber M.D on 12/06/2017 at 2:09 PM  Between 7am to 6pm - Pager - [phone]  After 6pm go to [email or web address] - password [email or web address] [facility]  [facility]  Office  [phone]  CC: Primary care physician; Lorelee Market, MD

## 2017-12-06 NOTE — Consult Note (Signed)
Name: Nicholas Cole MRN: [id number] DOB: 1965-09-26     CONSULTATION DATE: 12/06/2017  REFERRING MD :  Verdell Carmine  CHIEF COMPLAINT:  hyperglycemia   HISTORY OF PRESENT ILLNESS:  52 year old male with history of schizophrenia, bipolar disorder, and hypertension who presented to the emergency department for altered mental status. The patient is unable to answer questions due to his condition, therefore history is provided by the patient's chart. Patient was found at his home unresponsive this morning. Patient presented to the ED via EMS with AMS, hyperglycemia (900's), acute on chronic renal failure, and hypotension. In the ED patient was able to follow simple commands by nodding his head and moving his limbs. In the ICU, the patient tells the RN that he is "hallucinating" and he "did not take his medications". The patient states that he is in [place]. Additional history was obtained by Dr. Verdell Carmine who spoke to the patient's friend on the phone. Patient's friend endorses that the patient had being noticing some increased lethargy and weakness since Sunday. The patient's friend stated that the patient told him they had been adjusting his psych medications. He told his friend these adjustments had been causing him to sweat and be weak.    PAST MEDICAL HISTORY :   has a past medical history of Acute renal failure (ARF) ([place]), Diabetes mellitus without complication ([place]), and Schizophrenia ([place]).  has no past surgical history on file. Prior to Admission medications   Medication Sig Start Date End Date Taking? Authorizing Provider  amLODipine (NORVASC) 5 MG tablet Take 1 tablet by mouth daily. 12/01/17  Yes [provider]  chlorthalidone (HYGROTON) 25 MG tablet Take 1 tablet by mouth daily. 12/01/17  Yes [provider]  cloZAPine (CLOZARIL) 200 MG tablet Take 1 tablet (200 mg total) by mouth 2 (two) times daily. 02/07/17  Yes Pucilowska, Jolanta B, MD  divalproex (DEPAKOTE) 500 MG  DR tablet Take 1 tablet by mouth 2 (two) times daily. 12/01/17  Yes [provider]  metoprolol succinate (TOPROL-XL) 100 MG 24 hr tablet Take 1 tablet (100 mg total) by mouth daily. 02/07/17  Yes Pucilowska, Jolanta B, MD  perphenazine (TRILAFON) 8 MG tablet Take 1 tablet (8 mg total) by mouth 2 (two) times daily. 02/07/17  Yes Pucilowska, Jolanta B, MD  prazosin (MINIPRESS) 2 MG capsule Take 1 capsule by mouth daily. 12/01/17  Yes [provider]  temazepam (RESTORIL) 30 MG capsule Take 1 capsule (30 mg total) by mouth at bedtime. 02/07/17  Yes Pucilowska, Jolanta B, MD  Vitamin D, Ergocalciferol, (DRISDOL) 50000 units CAPS capsule Take 1 capsule by mouth once a week.   Yes [provider]   Allergies  Allergen Reactions  . Valproic Acid Rash    hyperammonemia    FAMILY HISTORY:  family history is not on file. SOCIAL HISTORY:  reports that he has quit smoking. His smoking use included cigars. He has never used smokeless tobacco. He reports that he does not drink alcohol or use drugs.  REVIEW OF SYSTEMS:   Unable to obtain due to critical illness   VITAL SIGNS: Temp:  [99.1 F (37.3 C)] 99.1 F (37.3 C) (07/10 1115) Pulse Rate:  [65-82] 65 (07/10 1430) Resp:  [13-30] 22 (07/10 1430) BP: (99-106)/(65-74) 100/67 (07/10 1430) SpO2:  [94 %-97 %] 95 % (07/10 1430) Weight:  [86.3 kg (190 lb 4.1 oz)] 86.3 kg (190 lb 4.1 oz) (07/10 1116)  Physical Examination:  GENERAL:critically ill appearing, -resp distress HEAD: Normocephalic, atraumatic.  EYES: Pupils equal, round, reactive to light.  No scleral icterus.  MOUTH: Moist mucosal membrane. Dry and cracked lips NECK: Supple. No JVD.  PULMONARY: CTAB, -rhonchi, -wheezing CARDIOVASCULAR: S1 and S2. Regular rate and rhythm. No murmurs, rubs, or gallops. 2+ radial and dorsalis pedis pulses. GASTROINTESTINAL: Soft, nontender, -distended. No masses. Positive bowel sounds. No hepatosplenomegaly.  MUSCULOSKELETAL: No  swelling, clubbing, or edema.  NEUROLOGIC: Alert and oriented to name. Able to follow basic commands and move limbs on command. SKIN:intact,warm,dry. Dry skin on soles of feet.  I personally reviewed lab work that was obtained in last 24 hrs. CXR Independently reviewed-shows no evidence of cardiopulmonary disease.  CT head w/o contrast is unremarkable  Renal U/S shows no hydronephrosis.   CBC    Component Value Date/Time   WBC 6.9 12/06/2017 1118   RBC 4.62 12/06/2017 1118   HGB 13.8 12/06/2017 1118   HGB 14.6 02/06/2014 0108   HCT 46.7 12/06/2017 1118   HCT 45.9 02/06/2014 0108   PLT 105 (L) 12/06/2017 1118   PLT 229 02/06/2014 0108   MCV 101.0 (H) 12/06/2017 1118   MCV 88 02/06/2014 0108   MCH 30.0 12/06/2017 1118   MCHC 29.7 (L) 12/06/2017 1118   RDW 15.8 (H) 12/06/2017 1118   RDW 13.4 02/06/2014 0108   LYMPHSABS 0.3 (L) 12/06/2017 1118   LYMPHSABS 1.8 02/06/2014 0108   MONOABS 0.6 12/06/2017 1118   MONOABS 0.5 02/06/2014 0108   EOSABS 0.0 12/06/2017 1118   EOSABS 0.0 02/06/2014 0108   BASOSABS 0.0 12/06/2017 1118   BASOSABS 0.1 02/06/2014 0108   CMP     Component Value Date/Time   NA 120 (L) 12/06/2017 1118   NA 144 02/06/2014 0108   K 4.2 12/06/2017 1118   K 4.1 02/06/2014 0108   CL 81 (L) 12/06/2017 1118   CL 109 (H) 02/06/2014 0108   CO2 21 (L) 12/06/2017 1118   CO2 28 02/06/2014 0108   GLUCOSE 945 (HH) 12/06/2017 1118   GLUCOSE 99 02/06/2014 0108   BUN 73 (H) 12/06/2017 1118   BUN 10 02/06/2014 0108   CREATININE 6.16 (H) 12/06/2017 1118   CREATININE 1.27 02/06/2014 0108   CALCIUM 8.6 (L) 12/06/2017 1118   CALCIUM 9.1 02/06/2014 0108   PROT 7.1 12/06/2017 1118   PROT 7.6 02/06/2014 0108   ALBUMIN 3.9 12/06/2017 1118   ALBUMIN 4.0 02/06/2014 0108   AST 16 12/06/2017 1118   AST 24 02/06/2014 0108   ALT 13 12/06/2017 1118   ALT 27 02/06/2014 0108   ALKPHOS 88 12/06/2017 1118   ALKPHOS 68 02/06/2014 0108   BILITOT 1.7 (H) 12/06/2017 1118   BILITOT  0.9 02/06/2014 0108   GFRNONAA 10 (L) 12/06/2017 1118   GFRNONAA >60 02/06/2014 0108   GFRAA 11 (L) 12/06/2017 1118   GFRAA >60 02/06/2014 0108     ASSESSMENT / PLAN:  52 year old male who presents to the hospital with altered mental status, hyperglycemia, hypotension, and acute on chronic renal failure. Patient has significant past medical history of schizophrenia, renal failure, and bipolar disorder. On exam, patient is altered, but is able to follow basic commands.   1. New onset Diabetes with Severe hyperglycemia: Patient has no history of diabetes in his chart and is not able to provide further history at this time. Will consider this new onset diabetes. Continue insulin drip, IVF, BMPq 4 hours. Potassium if hypokalemia secondary to insulin drip.  2. Altered mental status: likely secondary to dehydration and hyperglycemia. CT  head is unremarkable. Continue hydration and treatment for hyperglycemia  3. Acute on Chronic Renal Failure: Creatinine on presentation is 6.16 with a baseline of  2.67 (last drawn in 02/2017), initial BUN 73. Dr. Verdell Carmine has consulted nephrology for further management. Continue IVF. Trend Cr and BUN. U/A without infection.  4. Hypovolemic shock: secondary to dehydration, continue IVF. Hold home blood pressure medication. No abx needed at this time.  5. Schizophrenia: Per patient's chart, the patient has been admitted to [facility] in the past. If patient can swallow, continue depakote, clozaril, and pherphenazine. Behavioral health consult. Lithium level ordered. Valproic acid level normal.   6. Pseudohyponatremia: due to hyperglycemia. Corrected sodium is between 135-140. No treatment needed at this time.   Critical Care Time devoted to patient care services described in this note is 45 minutes.   Overall, patient is critically ill, prognosis is guarded.

## 2017-12-06 NOTE — Consult Note (Signed)
[facility] Consultation      Name: Nicholas Cole MRN: [id number] DOB: May 30, 1966    ADMISSION DATE:  12/06/2017 CONSULTATION DATE:  12/06/2017   REFERRING MD :  Verdell Carmine   CHIEF COMPLAINT:  Acute encephlopathy   HISTORY OF PRESENT ILLNESS  52 year old male with history of schizophrenia, bipolar disorder, and hypertension who presented to the emergency department for altered mental status.   The patient is unable to answer questions due to his condition, therefore history is provided by the patient's chart. Patient was found at his home unresponsive this morning.   Patient presented to the ED via EMS with AMS, hyperglycemia (900's), acute on chronic renal failure, and hypotension.   Remains encephalopathic    PAST MEDICAL HISTORY    :  Past Medical History:  Diagnosis Date  . Acute renal failure (ARF) ([place])   . Diabetes mellitus without complication ([place])   . Schizophrenia ([place])    History reviewed. No pertinent surgical history. Prior to Admission medications   Medication Sig Start Date End Date Taking? Authorizing Provider  amLODipine (NORVASC) 5 MG tablet Take 1 tablet by mouth daily. 12/01/17  Yes [provider]  chlorthalidone (HYGROTON) 25 MG tablet Take 1 tablet by mouth daily. 12/01/17  Yes [provider]  cloZAPine (CLOZARIL) 200 MG tablet Take 1 tablet (200 mg total) by mouth 2 (two) times daily. 02/07/17  Yes Pucilowska, Jolanta B, MD  divalproex (DEPAKOTE) 500 MG DR tablet Take 1 tablet by mouth 2 (two) times daily. 12/01/17  Yes [provider]  metoprolol succinate (TOPROL-XL) 100 MG 24 hr tablet Take 1 tablet (100 mg total) by mouth daily. 02/07/17  Yes Pucilowska, Jolanta B, MD  perphenazine (TRILAFON) 8 MG tablet Take 1 tablet (8 mg total) by mouth 2 (two) times daily. 02/07/17  Yes Pucilowska, Jolanta B, MD  prazosin (MINIPRESS) 2 MG capsule Take 1 capsule by mouth daily. 12/01/17  Yes [provider]    temazepam (RESTORIL) 30 MG capsule Take 1 capsule (30 mg total) by mouth at bedtime. 02/07/17  Yes Pucilowska, Jolanta B, MD  Vitamin D, Ergocalciferol, (DRISDOL) 50000 units CAPS capsule Take 1 capsule by mouth once a week.   Yes [provider]   Allergies  Allergen Reactions  . Valproic Acid Rash    hyperammonemia     FAMILY HISTORY   No family history on file.    SOCIAL HISTORY    reports that he has quit smoking. His smoking use included cigars. He has never used smokeless tobacco. He reports that he does not drink alcohol or use drugs.  Review of Systems  Unable to perform ROS: Mental acuity  All other systems reviewed and are negative.     VITAL SIGNS    Temp:  [97.5 F (36.4 C)-99.1 F (37.3 C)] 97.5 F (36.4 C) (07/10 1600) Pulse Rate:  [63-82] 63 (07/10 1600) Resp:  [13-30] 23 (07/10 1600) BP: (95-106)/(63-74) 95/63 (07/10 1600) SpO2:  [94 %-97 %] 94 % (07/10 1600) Weight:  [190 lb 4.1 oz (86.3 kg)] 190 lb 4.1 oz (86.3 kg) (07/10 1116) HEMODYNAMICS:   VENTILATOR SETTINGS:   INTAKE / OUTPUT:  Intake/Output Summary (Last 24 hours) at 12/06/2017 1712 Last data filed at 12/06/2017 1709 Gross per 24 hour  Intake 3485.62 ml  Output 200 ml  Net 3285.62 ml       PHYSICAL EXAM   Physical Exam  Constitutional: No distress.  HENT:  Head: Normocephalic and atraumatic.  Eyes: Pupils are equal, round, and reactive to light.  Neck: Normal range of motion. Neck supple.  Cardiovascular: Normal rate, regular rhythm, normal heart sounds and intact distal pulses.  Pulmonary/Chest: Effort normal and breath sounds normal.  Abdominal: Soft. Bowel sounds are normal.  Neurological:  confused  Skin: Skin is warm and dry.       LABS   LABS:  CBC Recent Labs  Lab 12/06/17 1118  WBC 6.9  HGB 13.8  HCT 46.7  PLT 105*   Coag's No results for input(s): APTT, INR in the last 168 hours. BMET Recent Labs  Lab 12/06/17 1118  NA 120*  K 4.2   CL 81*  CO2 21*  BUN 73*  CREATININE 6.16*  GLUCOSE 945*   Electrolytes Recent Labs  Lab 12/06/17 1118  CALCIUM 8.6*   Sepsis Markers Recent Labs  Lab 12/06/17 1118 12/06/17 1606  LATICACIDVEN 1.9 3.1*   ABG No results for input(s): PHART, PCO2ART, PO2ART in the last 168 hours. Liver Enzymes Recent Labs  Lab 12/06/17 1118  AST 16  ALT 13  ALKPHOS 88  BILITOT 1.7*  ALBUMIN 3.9   Cardiac Enzymes Recent Labs  Lab 12/06/17 1118  TROPONINI 0.03*   Glucose Recent Labs  Lab 12/06/17 1113 12/06/17 1203 12/06/17 1328 12/06/17 1436 12/06/17 1554 12/06/17 1705  GLUCAP >600* >600* >600* >600* >600* >600*     No results found for this or any previous visit (from the past 240 hour(s)).   Current Facility-Administered Medications:  .  0.9 %  sodium chloride infusion, , Intravenous, Continuous, Sainani, Vivek J, MD .  0.9 %  sodium chloride infusion, , Intravenous, Continuous, Sainani, Belia Heman, MD, Last Rate: 125 mL/hr at 12/06/17 1647 .  acetaminophen (TYLENOL) tablet 650 mg, 650 mg, Oral, Q6H PRN **OR** acetaminophen (TYLENOL) suppository 650 mg, 650 mg, Rectal, Q6H PRN, Sainani, Belia Heman, MD .  cloZAPine (CLOZARIL) tablet 200 mg, 200 mg, Oral, BID, Sainani, Vivek J, MD .  dextrose 5 %-0.45 % sodium chloride infusion, , Intravenous, Continuous, Sainani, Vivek J, MD .  divalproex (DEPAKOTE) DR tablet 500 mg, 500 mg, Oral, BID, Sainani, Vivek J, MD .  heparin injection 5,000 Units, 5,000 Units, Subcutaneous, Q8H, Henreitta Leber, MD, 5,000 Units at 12/06/17 1648 .  insulin regular (NOVOLIN R,HUMULIN R) 100 Units in sodium chloride 0.9 % 100 mL (1 Units/mL) infusion, , Intravenous, Continuous, Sainani, Vivek J, MD, Last Rate: 10.8 mL/hr at 12/06/17 1708, 10.8 Units/hr at 12/06/17 1708 .  ondansetron (ZOFRAN) tablet 4 mg, 4 mg, Oral, Q6H PRN **OR** ondansetron (ZOFRAN) injection 4 mg, 4 mg, Intravenous, Q6H PRN, Sainani, Vivek J, MD .  perphenazine (TRILAFON) tablet 8  mg, 8 mg, Oral, BID, Sainani, Vivek J, MD .  potassium chloride 10 mEq in 100 mL IVPB, 10 mEq, Intravenous, Q1H, Henreitta Leber, MD, Last Rate: 100 mL/hr at 12/06/17 1648, 10 mEq at 12/06/17 1648 .  [START ON 12/07/2017] prazosin (MINIPRESS) capsule 2 mg, 2 mg, Oral, Daily, Sainani, Vivek J, MD .  sodium chloride 0.9 % bolus 1,000 mL, 1,000 mL, Intravenous, Once, Flora Lipps, MD, Last Rate: 1,000 mL/hr at 12/06/17 1709, 1,000 mL at 12/06/17 1709 .  temazepam (RESTORIL) capsule 30 mg, 30 mg, Oral, QHS, Sainani, Belia Heman, MD .  Derrill Memo ON 12/08/2017] Vitamin D (Ergocalciferol) (DRISDOL) capsule 50,000 Units, 50,000 Units, Oral, Weekly, Henreitta Leber, MD  IMAGING    Ct Head Wo Contrast  Result Date: 12/06/2017 CLINICAL DATA:  Altered mental status  with lethargy and hyperglycemia. History of diabetes and renal failure. EXAM: CT HEAD WITHOUT CONTRAST TECHNIQUE: Contiguous axial images were obtained from the base of the skull through the vertex without intravenous contrast. COMPARISON:  CT head 11/05/2016. FINDINGS: Brain: There is no evidence of acute intracranial hemorrhage, mass lesion, brain edema or extra-axial fluid collection. The ventricles and subarachnoid spaces are appropriately sized for age. There is no CT evidence of acute cortical infarction. Vascular:  No hyperdense vessel identified. Skull: Negative for fracture or focal lesion. Sinuses/Orbits: The visualized paranasal sinuses and mastoid air cells are clear. No orbital abnormalities are seen. Other: None. IMPRESSION: Stable unremarkable noncontrast head CT. Electronically Signed   By: Richardean Sale M.D.   On: 12/06/2017 12:58   US Renal  Result Date: 12/06/2017 CLINICAL DATA:  Initial evaluation for acute renal failure. EXAM: RENAL / URINARY TRACT ULTRASOUND COMPLETE COMPARISON:  [facility] CT from 01/27/2017. FINDINGS: Right Kidney: Length: 9.0 cm. Echogenicity within normal limits. No mass or hydronephrosis visualized. Left Kidney:  Length: 9.5 cm. Echogenicity within normal limits. No hydronephrosis. 1.1 x 1.2 x 1.0 cm simple cyst present within the interpolar region. Bladder: Appears normal for degree of bladder distention. IMPRESSION: 1. No hydronephrosis. 2. 1.2 cm simple cyst within the interpolar left kidney. Electronically Signed   By: Jeannine Boga M.D.   On: 12/06/2017 15:33   Dg Chest Portable 1 View  Result Date: 12/06/2017 CLINICAL DATA:  Lethargy, unable to answer questions, hyperglycemia, diabetes mellitus, chronic renal failure, former smoker EXAM: PORTABLE CHEST 1 VIEW COMPARISON:  Portable exam 1140 hours compared to 11/16/2016 FINDINGS: Normal heart size, mediastinal contours, and pulmonary vascularity. Bibasilar atelectasis. Lungs otherwise clear. No pulmonary infiltrate, pleural effusion or pneumothorax. Osseous structures unremarkable. IMPRESSION: Bibasilar atelectasis. Electronically Signed   By: Lavonia Dana M.D.   On: 12/06/2017 11:50       INDWELLING DEVICES::  MICRO DATA: MRSA PCR  Urine  Blood Resp   ANTIMICROBIALS: none    ASSESSMENT/PLAN  51 yo with acute renal failure with DKA with hypovolumic shock with metabolic encephalopathy  PULMONARY Oxygen as needed  CARDIOVASCULAR SD monitoring  RENAL Renal Failure-most likely due to ATN -follow chem 7 -follow UO -continue Foley Catheter-assess need Nephrology consulted   GASTROINTESTINAL Advance diet  as tolerated  ENDOCRINE Insulin drip protocol Follow chem 7  NEUROLOGIC Neuro checks    Corrin Parker, M.D.  [facility]  Medical Director [place] Director [facility] Cardio-Pulmonary Department

## 2017-12-06 NOTE — ED Provider Notes (Signed)
[facility] Emergency Department Provider Note   ____________________________________________   First MD Initiated Contact with Patient 12/06/17 1120     (approximate)  I have reviewed the triage vital signs and the nursing notes.   HISTORY  Chief Complaint Hyperglycemia  History limited limited by obtundation HPI Nicholas Cole is a 52 y.o. male was last seen well on Monday.  Today is Wednesday.  Patient usually calls the act team every day.  Acting came and found patient unresponsive.  Patient was hypotensive with blood pressure in the 80s to 90s by EMS blood sugar was over 600 when they got there.  On his arrival here patient was obtunded but would respond to voice by nodding his head.  He would follow commands squeezing my fingers with both hands and moving his feet when asked.  Patient's blood pressure rapidly improved and his mental status and awareness rapidly improved with fluids    Past Medical History:  Diagnosis Date  . Acute renal failure (ARF) ([place])   . Diabetes mellitus without complication ([place])   . Schizophrenia [facility])     Patient Active Problem List   Diagnosis Date Noted  . GERD (gastroesophageal reflux disease) 01/30/2017  . Chronic renal failure 01/27/2017  . Hypertension 01/27/2017  . Lithium toxicity 11/06/2016  . ARF (acute renal failure) ([place]) 11/05/2016  . Dehydration 11/05/2016  . Rhabdomyolysis 11/05/2016  . Schizoaffective disorder, bipolar type ([place]) 11/05/2016    History reviewed. No pertinent surgical history.  Prior to Admission medications   Medication Sig Start Date End Date Taking? Authorizing Provider  amLODipine (NORVASC) 5 MG tablet Take 1 tablet by mouth daily. 12/01/17  Yes [provider]  chlorthalidone (HYGROTON) 25 MG tablet Take 1 tablet by mouth daily. 12/01/17  Yes [provider]  cloZAPine (CLOZARIL) 200 MG tablet Take 1 tablet (200 mg total) by mouth 2 (two) times daily. 02/07/17  Yes  Pucilowska, Jolanta B, MD  divalproex (DEPAKOTE) 500 MG DR tablet Take 1 tablet by mouth 2 (two) times daily. 12/01/17  Yes [provider]  metoprolol succinate (TOPROL-XL) 100 MG 24 hr tablet Take 1 tablet (100 mg total) by mouth daily. 02/07/17  Yes Pucilowska, Jolanta B, MD  perphenazine (TRILAFON) 8 MG tablet Take 1 tablet (8 mg total) by mouth 2 (two) times daily. 02/07/17  Yes Pucilowska, Jolanta B, MD  prazosin (MINIPRESS) 2 MG capsule Take 1 capsule by mouth daily. 12/01/17  Yes [provider]  temazepam (RESTORIL) 30 MG capsule Take 1 capsule (30 mg total) by mouth at bedtime. 02/07/17  Yes Pucilowska, Jolanta B, MD  Vitamin D, Ergocalciferol, (DRISDOL) 50000 units CAPS capsule Take 1 capsule by mouth once a week.   Yes [provider]    Allergies Valproic acid  No family history on file.  Social History Social History   Tobacco Use  . Smoking status: Former Research scientist (life sciences)  . Smokeless tobacco: Never Used  Substance Use Topics  . Alcohol use: No  . Drug use: No    Review of Systems  Unable to obtain due to mental status ____________________________________________   PHYSICAL EXAM:  VITAL SIGNS: ED Triage Vitals  Enc Vitals Group     BP 12/06/17 1115 106/72     Pulse Rate 12/06/17 1115 82     Resp 12/06/17 1115 20     Temp 12/06/17 1115 99.1 F (37.3 C)     Temp Source 12/06/17 1115 Axillary     SpO2 12/06/17 1115 96 %  Weight 12/06/17 1116 190 lb 4.1 oz (86.3 kg)     Height 12/06/17 1116 '5\' 11"'$  (1.803 m)     Head Circumference --      Peak Flow --      Pain Score --      Pain Loc --      Pain Edu? --      Excl. in [place]? --     Constitutional: Sleepy but arousable will follow most commands not speaking much Eyes: Conjunctivae are normal. PERRL. EOMI. Head: Atraumatic. Nose: No congestion/rhinnorhea. Mouth/Throat: Mucous membranes are dry.  Oropharynx non-erythematous. Neck: No stridor.  Cardiovascular: Normal rate, regular rhythm.  Grossly normal heart sounds.  Good peripheral circulation. Respiratory: Normal respiratory effort.  No retractions. Lungs CTAB. Gastrointestinal: Soft and nontender. No distention. No abdominal bruits. No CVA tenderness. Musculoskeletal: No lower extremity tenderness nor edema.  Neurologic: See HPI patient is arousable will follow most commands later when he wakes up morning speech is clear he still not back to baseline however Skin:  Skin is warm, dry and intact. No rash noted.   ____________________________________________   LABS (all labs ordered are listed, but only abnormal results are displayed)  Labs Reviewed  GLUCOSE, CAPILLARY - Abnormal; Notable for the following components:      Result Value   Glucose-Capillary >600 (*)    All other components within normal limits  COMPREHENSIVE METABOLIC PANEL - Abnormal; Notable for the following components:   Sodium 120 (*)    Chloride 81 (*)    CO2 21 (*)    BUN 73 (*)    Creatinine, Ser 6.16 (*)    Calcium 8.6 (*)    Total Bilirubin 1.7 (*)    GFR calc non Af Amer 10 (*)    GFR calc Af Amer 11 (*)    Anion gap 18 (*)    All other components within normal limits  ACETAMINOPHEN LEVEL - Abnormal; Notable for the following components:   Acetaminophen (Tylenol), Serum <10 (*)    All other components within normal limits  TROPONIN I - Abnormal; Notable for the following components:   Troponin I 0.03 (*)    All other components within normal limits  CBC WITH DIFFERENTIAL/PLATELET - Abnormal; Notable for the following components:   MCV 101.0 (*)    MCHC 29.7 (*)    RDW 15.8 (*)    Platelets 105 (*)    Lymphs Abs 0.3 (*)    All other components within normal limits  URINALYSIS, COMPLETE (UACMP) WITH MICROSCOPIC - Abnormal; Notable for the following components:   Color, Urine STRAW (*)    APPearance CLEAR (*)    Glucose, UA >=500 (*)    Hgb urine dipstick LARGE (*)    Ketones, ur 5 (*)    Bacteria, UA RARE (*)    All other  components within normal limits  URINE DRUG SCREEN, QUALITATIVE ([facility] ONLY) - Abnormal; Notable for the following components:   Barbiturates, Ur Screen   (*)    Value: Result not available. Reagent lot number recalled by manufacturer.   Benzodiazepine, Ur Scrn POSITIVE (*)    All other components within normal limits  BLOOD GAS, VENOUS - Abnormal; Notable for the following components:   pH, Ven 7.24 (*)    Acid-base deficit 7.3 (*)    All other components within normal limits  GLUCOSE, CAPILLARY - Abnormal; Notable for the following components:   Glucose-Capillary >600 (*)    All other components within normal limits  GLUCOSE, CAPILLARY - Abnormal; Notable for the following components:   Glucose-Capillary >600 (*)    All other components within normal limits  ETHANOL  LACTIC ACID, PLASMA  BRAIN NATRIURETIC PEPTIDE  VALPROIC ACID LEVEL  LACTIC ACID, PLASMA  CK  CBG MONITORING, ED   ____________________________________________  EKG  EKG read interpreted by me shows normal sinus rhythm rate of 83 normal axis no acute ST-T wave changes there is some ST elevation she is probably early repolarization. ____________________________________________  RADIOLOGY  ED MD interpretation: Chest x-ray read by radiology as bilateral atelectasis I reviewed the film and agree patient is not coughing CT of the head was read is stable  Official radiology report(s): Ct Head Wo Contrast  Result Date: 12/06/2017 CLINICAL DATA:  Altered mental status with lethargy and hyperglycemia. History of diabetes and renal failure. EXAM: CT HEAD WITHOUT CONTRAST TECHNIQUE: Contiguous axial images were obtained from the base of the skull through the vertex without intravenous contrast. COMPARISON:  CT head 11/05/2016. FINDINGS: Brain: There is no evidence of acute intracranial hemorrhage, mass lesion, brain edema or extra-axial fluid collection. The ventricles and subarachnoid spaces are appropriately sized for age.  There is no CT evidence of acute cortical infarction. Vascular:  No hyperdense vessel identified. Skull: Negative for fracture or focal lesion. Sinuses/Orbits: The visualized paranasal sinuses and mastoid air cells are clear. No orbital abnormalities are seen. Other: None. IMPRESSION: Stable unremarkable noncontrast head CT. Electronically Signed   By: Richardean Sale M.D.   On: 12/06/2017 12:58   Dg Chest Portable 1 View  Result Date: 12/06/2017 CLINICAL DATA:  Lethargy, unable to answer questions, hyperglycemia, diabetes mellitus, chronic renal failure, former smoker EXAM: PORTABLE CHEST 1 VIEW COMPARISON:  Portable exam 1140 hours compared to 11/16/2016 FINDINGS: Normal heart size, mediastinal contours, and pulmonary vascularity. Bibasilar atelectasis. Lungs otherwise clear. No pulmonary infiltrate, pleural effusion or pneumothorax. Osseous structures unremarkable. IMPRESSION: Bibasilar atelectasis. Electronically Signed   By: Lavonia Dana M.D.   On: 12/06/2017 11:50    ____________________________________________   PROCEDURES  Procedure(s) performed:   Procedures  Critical Care performed: Critical care time 1-1/2 hours.  This includes frequent reassessments of the patient ordering his tele-sitter as we did not have enough sitters and then as he woke up getting a life sitter talking to the lab about his lab work which is pending still cussing him with the hospitalist etc. ____________________________________________   [facility] [place] / ED COURSE  Patient has had 2-1/2 L of fluid bolus of 6 of aspart and has been on glucose stabilizer for over an hour his sugar still over 600.  Labs still cannot report what his total glucose is as the machine is still running dilutions.  We will send another met B.  Another venous gas as the first venous gas was not done for some reason and continue the fluids and insulin.  Hospitalist is aware and will come down and evaluate the  patient      ____________________________________________   FINAL CLINICAL IMPRESSION(S) / ED DIAGNOSES  Final diagnoses:  Diabetes mellitus with nonketotic hyperosmolarity [facility])     ED Discharge Orders    None       Note:  This document was prepared using Dragon voice recognition software and may include unintentional dictation errors.    Nena Polio, MD 12/06/17 [phone]

## 2017-12-07 LAB — GLUCOSE, CAPILLARY
GLUCOSE-CAPILLARY: 164 mg/dL — AB (ref 70–99)
GLUCOSE-CAPILLARY: 154 mg/dL — AB (ref 70–99)
GLUCOSE-CAPILLARY: 150 mg/dL — AB (ref 70–99)
GLUCOSE-CAPILLARY: 146 mg/dL — AB (ref 70–99)
Glucose-Capillary: 200 mg/dL — ABNORMAL HIGH (ref 70–99)
Glucose-Capillary: 274 mg/dL — ABNORMAL HIGH (ref 70–99)
Glucose-Capillary: 173 mg/dL — ABNORMAL HIGH (ref 70–99)
Glucose-Capillary: 173 mg/dL — ABNORMAL HIGH (ref 70–99)
Glucose-Capillary: 202 mg/dL — ABNORMAL HIGH (ref 70–99)
Glucose-Capillary: 258 mg/dL — ABNORMAL HIGH (ref 70–99)
Glucose-Capillary: 354 mg/dL — ABNORMAL HIGH (ref 70–99)
Glucose-Capillary: 281 mg/dL — ABNORMAL HIGH (ref 70–99)

## 2017-12-07 LAB — BASIC METABOLIC PANEL
Anion gap: 11 (ref 5–15)
Anion gap: 9 (ref 5–15)
BUN: 55 mg/dL — AB (ref 6–20)
BUN: 55 mg/dL — AB (ref 6–20)
CALCIUM: 9.2 mg/dL (ref 8.9–10.3)
CALCIUM: 9.5 mg/dL (ref 8.9–10.3)
CO2: 24 mmol/L (ref 22–32)
CO2: 23 mmol/L (ref 22–32)
Chloride: 119 mmol/L — ABNORMAL HIGH (ref 98–111)
Chloride: 121 mmol/L — ABNORMAL HIGH (ref 98–111)
Creatinine, Ser: 4.63 mg/dL — ABNORMAL HIGH (ref 0.61–1.24)
Creatinine, Ser: 4.88 mg/dL — ABNORMAL HIGH (ref 0.61–1.24)
GFR calc Af Amer: 16 mL/min — ABNORMAL LOW (ref >60)
GFR calc Af Amer: 15 mL/min — ABNORMAL LOW (ref >60)
GFR calc non Af Amer: 13 mL/min — ABNORMAL LOW (ref >60)
GFR calc non Af Amer: 13 mL/min — ABNORMAL LOW (ref >60)
GLUCOSE: 277 mg/dL — AB (ref 70–99)
Glucose, Bld: 139 mg/dL — ABNORMAL HIGH (ref 70–99)
Potassium: 2.8 mmol/L — ABNORMAL LOW (ref 3.5–5.1)
Potassium: 3.2 mmol/L — ABNORMAL LOW (ref 3.5–5.1)
Sodium: 153 mmol/L — ABNORMAL HIGH (ref 135–145)
Sodium: 154 mmol/L — ABNORMAL HIGH (ref 135–145)

## 2017-12-07 LAB — BLOOD GAS, ARTERIAL
ACID-BASE DEFICIT: 4.4 mmol/L — AB (ref 0.0–2.0)
BICARBONATE: 21 mmol/L (ref 20.0–28.0)
FIO2: 0.21
O2 SAT: 98.8 %
PCO2 ART: 39 mmHg (ref 32.0–48.0)
PO2 ART: 74 mmHg — AB (ref 83.0–108.0)
Patient temperature: 37
pH, Arterial: 7.34 — ABNORMAL LOW (ref 7.350–7.450)

## 2017-12-07 LAB — CBC
HCT: 40.6 % (ref 40.0–52.0)
HEMOGLOBIN: 14.1 g/dL (ref 13.0–18.0)
MCH: 30.1 pg (ref 26.0–34.0)
MCHC: 34.8 g/dL (ref 32.0–36.0)
MCV: 86.5 fL (ref 80.0–100.0)
Platelets: 101 10*3/uL — ABNORMAL LOW (ref 150–440)
RBC: 4.69 MIL/uL (ref 4.40–5.90)
RDW: 13 % (ref 11.5–14.5)
WBC: 7.6 10*3/uL (ref 3.8–10.6)

## 2017-12-07 MED ORDER — INSULIN ASPART 100 UNIT/ML ~~LOC~~ SOLN
0.0000 [IU] | Freq: Three times a day (TID) | SUBCUTANEOUS | Status: DC
  Administered 2017-12-07: 3 [IU] via SUBCUTANEOUS
  Filled 2017-12-07 (×2): qty 1

## 2017-12-07 MED ORDER — INSULIN GLARGINE 100 UNIT/ML ~~LOC~~ SOLN
8.0000 [IU] | SUBCUTANEOUS | Status: DC
  Administered 2017-12-07: 8 [IU] via SUBCUTANEOUS
  Filled 2017-12-07: qty 0.08

## 2017-12-07 MED ORDER — INSULIN GLARGINE 100 UNIT/ML ~~LOC~~ SOLN
8.0000 [IU] | Freq: Every day | SUBCUTANEOUS | Status: DC
  Filled 2017-12-07: qty 0.08

## 2017-12-07 MED ORDER — INSULIN ASPART 100 UNIT/ML ~~LOC~~ SOLN
0.0000 [IU] | Freq: Every day | SUBCUTANEOUS | Status: DC
  Administered 2017-12-07: 3 [IU] via SUBCUTANEOUS
  Filled 2017-12-07: qty 1

## 2017-12-07 MED ORDER — POTASSIUM CHLORIDE 10 MEQ/100ML IV SOLN
10.0000 meq | INTRAVENOUS | Status: AC
  Administered 2017-12-07 (×4): 10 meq via INTRAVENOUS
  Filled 2017-12-07 (×4): qty 100

## 2017-12-07 MED ORDER — SODIUM CHLORIDE 0.9 % IV SOLN
INTRAVENOUS | Status: DC
  Filled 2017-12-07: qty 1

## 2017-12-07 MED ORDER — DEXTROSE 5 % IV SOLN: INTRAVENOUS | Status: DC

## 2017-12-07 MED ORDER — INSULIN ASPART 100 UNIT/ML ~~LOC~~ SOLN
0.0000 [IU] | Freq: Three times a day (TID) | SUBCUTANEOUS | Status: DC
  Administered 2017-12-07: 15 [IU] via SUBCUTANEOUS
  Administered 2017-12-07: 8 [IU] via SUBCUTANEOUS
  Filled 2017-12-07 (×2): qty 1

## 2017-12-07 MED ORDER — INSULIN ASPART 100 UNIT/ML ~~LOC~~ SOLN: 0.0000 [IU] | Freq: Every day | SUBCUTANEOUS | Status: DC

## 2017-12-07 NOTE — Progress Notes (Signed)
Pt sitting up in bed eating supper. Alert and oriented at this time. Denies pain or discomfort.

## 2017-12-07 NOTE — Progress Notes (Addendum)
Inpatient Diabetes Program Recommendations  AACE/ADA: New Consensus Statement on Inpatient Glycemic Control (2015)  Target Ranges:  Prepandial:   less than 140 mg/dL      Peak postprandial:   less than 180 mg/dL (1-2 hours)      Critically ill patients:  140 - 180 mg/dL   Lab Results  Component Value Date   GLUCAP 202 (H) 12/07/2017   HGBA1C 4.4 (L) 01/28/2017    Review of Glycemic ControlResults for Nicholas Cole, Nicholas Cole (MRN [id number]) as of 12/07/2017 08:50  Ref. Range 12/06/2017 11:18 12/06/2017 16:34 12/06/2017 19:44 12/07/2017 00:01  Glucose Latest Ref Range: 70 - 99 mg/dL 945 (HH) 1,137 (HH) 727 (HH) 277 (H)    Diabetes history: DM- New diagnosis (A1C on 09/08/17 was 4.7%) Outpatient Diabetes medications: No meds listed Current orders for Inpatient glycemic control: IV insulin to Lantus 8 units daily and Novolog sensitive tid with meals  Inpatient Diabetes Program Recommendations:    This appears to be a new diagnosis for patient of DM/hyperglycemia.  A1C in April of 2019 was only 4.7%?    Please check A1C.  Will follow and attempt talk to patient today.  He will likely need insulin at d/c. Discussed with RN.     Thanks,  Adah Perl, RN, BC-ADM Inpatient Diabetes Coordinator Pager [phone] (8a-5p)  11:30 Attempted to see patient. He was sleeping.  Will have diabetes coordinator f/u on 12/08/17.

## 2017-12-07 NOTE — Progress Notes (Signed)
[facility] [place] NAME: Nicholas Cole    MR#:  [id number]  DATE OF BIRTH:  07-06-1965  SUBJECTIVE:  CHIEF COMPLAINT:   Chief Complaint  Patient presents with  . Hyperglycemia   - admitted with hyperglycemia and DKA- no known h/o DM - mental status improving, also has ARF- urine output improved  REVIEW OF SYSTEMS:  Review of Systems  Constitutional: Negative for chills, fever and malaise/fatigue.  HENT: Negative for hearing loss.   Eyes: Positive for blurred vision. Negative for double vision.  Respiratory: Positive for shortness of breath. Negative for cough and wheezing.   Cardiovascular: Negative for chest pain and palpitations.  Gastrointestinal: Negative for abdominal pain, constipation, diarrhea, nausea and vomiting.  Genitourinary: Negative for dysuria.  Musculoskeletal: Positive for myalgias. Negative for back pain.  Neurological: Negative for dizziness, focal weakness, seizures, weakness and headaches.  Psychiatric/Behavioral: Negative for depression.    DRUG ALLERGIES:   Allergies  Allergen Reactions  . Valproic Acid Rash    hyperammonemia    VITALS:  Blood pressure 113/83, pulse 71, temperature 98.4 F (36.9 C), temperature source Oral, resp. rate (!) 22, height 5\' 11"  (1.803 m), weight 86.3 kg (190 lb 4.1 oz), SpO2 98 %.  PHYSICAL EXAMINATION:  Physical Exam  GENERAL:  52 y.o.-year-old patient lying in the bed with no acute distress.  EYES: Pupils equal, round, reactive to light and accommodation. No scleral icterus. Extraocular muscles intact.  HEENT: Head atraumatic, normocephalic. Oropharynx and nasopharynx clear.  NECK:  Supple, no jugular venous distention. No thyroid enlargement, no tenderness.  LUNGS: coarse breath sounds bilaterally, with scattered wheezing,no rales,rhonchi or crepitation. No use of accessory muscles of respiration.  CARDIOVASCULAR: S1, S2 normal. No  rubs, or gallops. 2/6 systolic  murmur present ABDOMEN: Soft, nontender, nondistended. Bowel sounds present. No organomegaly or mass.  EXTREMITIES: No pedal edema, cyanosis, or clubbing.  NEUROLOGIC: Cranial nerves II through XII are intact. Decreased visual acuity.  Muscle strength 5/5 in all extremities. Sensation intact. Gait not checked.  PSYCHIATRIC: The patient is alert and oriented x 3. Some intermittent confusion noted. SKIN: No obvious rash, lesion, or ulcer.    LABORATORY PANEL:   CBC Recent Labs  Lab 12/07/17 0611  WBC 7.6  HGB 14.1  HCT 40.6  PLT 101*   ------------------------------------------------------------------------------------------------------------------  Chemistries  Recent Labs  Lab 12/06/17 1118  12/07/17 0611  NA 120*   < > 154*  K 4.2   < > 3.2*  CL 81*   < > 121*  CO2 21*   < > 24  GLUCOSE 945*   < > 139*  BUN 73*   < > 55*  CREATININE 6.16*   < > 4.88*  CALCIUM 8.6*   < > 9.2  AST 16  --   --   ALT 13  --   --   ALKPHOS 88  --   --   BILITOT 1.7*  --   --    < > = values in this interval not displayed.   ------------------------------------------------------------------------------------------------------------------  Cardiac Enzymes Recent Labs  Lab 12/06/17 1118  TROPONINI 0.03*   ------------------------------------------------------------------------------------------------------------------  RADIOLOGY:  Ct Head Wo Contrast  Result Date: 12/06/2017 CLINICAL DATA:  Altered mental status with lethargy and hyperglycemia. History of diabetes and renal failure. EXAM: CT HEAD WITHOUT CONTRAST TECHNIQUE: Contiguous axial images were obtained from the base of the skull through the vertex without intravenous contrast. COMPARISON:  CT head 11/05/2016. FINDINGS: Brain:  There is no evidence of acute intracranial hemorrhage, mass lesion, brain edema or extra-axial fluid collection. The ventricles and subarachnoid spaces are appropriately sized for age. There is no CT  evidence of acute cortical infarction. Vascular:  No hyperdense vessel identified. Skull: Negative for fracture or focal lesion. Sinuses/Orbits: The visualized paranasal sinuses and mastoid air cells are clear. No orbital abnormalities are seen. Other: None. IMPRESSION: Stable unremarkable noncontrast head CT. Electronically Signed   By: Richardean Sale M.D.   On: 12/06/2017 12:58   US Renal  Result Date: 12/06/2017 CLINICAL DATA:  Initial evaluation for acute renal failure. EXAM: RENAL / URINARY TRACT ULTRASOUND COMPLETE COMPARISON:  [facility] CT from 01/27/2017. FINDINGS: Right Kidney: Length: 9.0 cm. Echogenicity within normal limits. No mass or hydronephrosis visualized. Left Kidney: Length: 9.5 cm. Echogenicity within normal limits. No hydronephrosis. 1.1 x 1.2 x 1.0 cm simple cyst present within the interpolar region. Bladder: Appears normal for degree of bladder distention. IMPRESSION: 1. No hydronephrosis. 2. 1.2 cm simple cyst within the interpolar left kidney. Electronically Signed   By: Jeannine Boga M.D.   On: 12/06/2017 15:33   Dg Chest Portable 1 View  Result Date: 12/06/2017 CLINICAL DATA:  Lethargy, unable to answer questions, hyperglycemia, diabetes mellitus, chronic renal failure, former smoker EXAM: PORTABLE CHEST 1 VIEW COMPARISON:  Portable exam 1140 hours compared to 11/16/2016 FINDINGS: Normal heart size, mediastinal contours, and pulmonary vascularity. Bibasilar atelectasis. Lungs otherwise clear. No pulmonary infiltrate, pleural effusion or pneumothorax. Osseous structures unremarkable. IMPRESSION: Bibasilar atelectasis. Electronically Signed   By: Lavonia Dana M.D.   On: 12/06/2017 11:50    EKG:   Orders placed or performed during the hospital encounter of 12/06/17  . EKG 12-Lead  . EKG 12-Lead  . ED EKG  . ED EKG  . EKG 12-Lead  . EKG 12-Lead    ASSESSMENT AND PLAN:   52 year old male with past medical history significant for schizophrenia, bipolar, hypertension  presents to hospitals secondary to altered mental status.  1. Altered mental status-uremic encephalopathy and metabolic encephalopathy -receiving fluids, improving renal function. -No acute indication for hemodialysis. -Mental status is improving  2. Diabetic ketoacidosis-no known history of diabetes. -Was started on insulin drip as sugars greater than 1100 on admission -A1c is pending -off insulin drip at this time. Diabetes coordinator consult -on low-dose Lantus and sliding scale insulin``  3. Acute renal failure-appreciate nephrology consult. Urine output has improved. Renal ultrasound with no hydronephrosis -creatinine is slowly improving  4. Hypernatremia-recommend D5 fluids and might need insulin drip if sugars are starting to get elevated because of that  5. Schizophrenia and bipolar-continue outpatient medications. -Patient on Depakote, Clozaril and perphenazine  6. DVT Prophylaxis- SQ heparin  Independent at baseline, recommend PT consult   All the records are reviewed and case discussed with Care Management/Social Workerr. Management plans discussed with the patient, family and they are in agreement.  CODE STATUS: Full code  TOTAL TIME TAKING CARE OF THIS PATIENT: 38 minutes.   POSSIBLE D/C IN 3 DAYS, DEPENDING ON CLINICAL CONDITION.   Gladstone Lighter M.D on 12/07/2017 at 2:51 PM  Between 7am to 6pm - Pager - [phone]  After 6pm go to [email or web address] - password [email or web address] [facility]  Office  [phone]  CC: Primary care physician; Lorelee Market, MD

## 2017-12-07 NOTE — Progress Notes (Signed)
[facility]  ROUNDING NOTE   Subjective:  Patient well-known to [place] from prior admission. He presents now with severe hyperglycemia and acute renal failure. He was lethargic earlier in the course of this hospitalization but more awake and alert this a.m. Hypernatremia also noted. Hyperglycemia has improved with insulin drip.   Objective:  Vital signs in last 24 hours:  Temp:  [97.2 F (36.2 C)-99.1 F (37.3 C)] 98.4 F (36.9 C) (07/11 0757) Pulse Rate:  [59-82] 71 (07/11 0900) Resp:  [13-31] 20 (07/11 1000) BP: (76-117)/(55-84) 107/74 (07/11 1000) SpO2:  [93 %-98 %] 98 % (07/11 1000) Weight:  [86.3 kg (190 lb 4.1 oz)] 86.3 kg (190 lb 4.1 oz) (07/10 1116)  Weight change:  Filed Weights   12/06/17 1116  Weight: 86.3 kg (190 lb 4.1 oz)    Intake/Output: I/O last 3 completed shifts: In: 5516.3 [I.V.:1868.5; IV Piggyback:3647.9] Out: 200 [Urine:200]   Intake/Output this shift:  No intake/output data recorded.  Physical Exam: General: No acute distress  Head: Normocephalic, atraumatic. Dry oral mucosal membranes  Eyes: Anicteric  Neck: Supple, trachea midline  Lungs:  Clear to auscultation, normal effort  Heart: S1S2 no rubs  Abdomen:  Soft, nontender, bowel sounds present  Extremities: No peripheral edema.  Neurologic: Awake, alert, following commands  Skin: No lesions       Basic Metabolic Panel: Recent Labs  Lab 12/06/17 1118 12/06/17 1634 12/06/17 1944 12/07/17 0001  NA 120* 138 146* 153*  K 4.2 2.7* 3.0* 2.8*  CL 81* 104 113* 119*  CO2 21* 23 22 23   GLUCOSE 945* 1,137* 727* 277*  BUN 73* 62* 59* 55*  CREATININE 6.16* 5.22* 5.11* 4.63*  CALCIUM 8.6* 9.1 9.4 9.5    Liver Function Tests: Recent Labs  Lab 12/06/17 1118  AST 16  ALT 13  ALKPHOS 88  BILITOT 1.7*  PROT 7.1  ALBUMIN 3.9   No results for input(s): LIPASE, AMYLASE in the last 168 hours. No results for input(s): AMMONIA in the last 168 hours.  CBC: Recent Labs  Lab  12/06/17 1118 12/07/17 0611  WBC 6.9 7.6  NEUTROABS 6.0  --   HGB 13.8 14.1  HCT 46.7 40.6  MCV 101.0* 86.5  PLT 105* 101*    Cardiac Enzymes: Recent Labs  Lab 12/06/17 1118 12/06/17 1201  CKTOTAL  --  702*  TROPONINI 0.03*  --     BNP: Invalid input(s): POCBNP  CBG: Recent Labs  Lab 12/07/17 0352 12/07/17 0448 12/07/17 0503 12/07/17 0619 12/07/17 0831  GLUCAP 154* 150* 173* 146* 64*    Microbiology: Results for orders placed or performed during the hospital encounter of 12/06/17  MRSA PCR Screening     Status: None   Collection Time: 12/06/17  4:00 PM  Result Value Ref Range Status   MRSA by PCR NEGATIVE NEGATIVE Final    Comment:        The GeneXpert MRSA Assay (FDA approved for NASAL specimens only), is one component of a comprehensive MRSA colonization surveillance program. It is not intended to diagnose MRSA infection nor to guide or monitor treatment for MRSA infections. Performed at [facility], [address] [place], [place] [address]     Coagulation Studies: No results for input(s): LABPROT, INR in the last 72 hours.  Urinalysis: Recent Labs    12/06/17 1118  COLORURINE STRAW*  LABSPEC 1.025  PHURINE 6.0  GLUCOSEU >=500*  HGBUR LARGE*  BILIRUBINUR NEGATIVE  KETONESUR 5*  PROTEINUR NEGATIVE  NITRITE  NEGATIVE  LEUKOCYTESUR NEGATIVE      Imaging: Ct Head Wo Contrast  Result Date: 12/06/2017 CLINICAL DATA:  Altered mental status with lethargy and hyperglycemia. History of diabetes and renal failure. EXAM: CT HEAD WITHOUT CONTRAST TECHNIQUE: Contiguous axial images were obtained from the base of the skull through the vertex without intravenous contrast. COMPARISON:  CT head 11/05/2016. FINDINGS: Brain: There is no evidence of acute intracranial hemorrhage, mass lesion, brain edema or extra-axial fluid collection. The ventricles and subarachnoid spaces are appropriately sized for age. There is no CT evidence of acute  cortical infarction. Vascular:  No hyperdense vessel identified. Skull: Negative for fracture or focal lesion. Sinuses/Orbits: The visualized paranasal sinuses and mastoid air cells are clear. No orbital abnormalities are seen. Other: None. IMPRESSION: Stable unremarkable noncontrast head CT. Electronically Signed   By: Richardean Sale M.D.   On: 12/06/2017 12:58   US Renal  Result Date: 12/06/2017 CLINICAL DATA:  Initial evaluation for acute renal failure. EXAM: RENAL / URINARY TRACT ULTRASOUND COMPLETE COMPARISON:  [facility] CT from 01/27/2017. FINDINGS: Right Kidney: Length: 9.0 cm. Echogenicity within normal limits. No mass or hydronephrosis visualized. Left Kidney: Length: 9.5 cm. Echogenicity within normal limits. No hydronephrosis. 1.1 x 1.2 x 1.0 cm simple cyst present within the interpolar region. Bladder: Appears normal for degree of bladder distention. IMPRESSION: 1. No hydronephrosis. 2. 1.2 cm simple cyst within the interpolar left kidney. Electronically Signed   By: Jeannine Boga M.D.   On: 12/06/2017 15:33   Dg Chest Portable 1 View  Result Date: 12/06/2017 CLINICAL DATA:  Lethargy, unable to answer questions, hyperglycemia, diabetes mellitus, chronic renal failure, former smoker EXAM: PORTABLE CHEST 1 VIEW COMPARISON:  Portable exam 1140 hours compared to 11/16/2016 FINDINGS: Normal heart size, mediastinal contours, and pulmonary vascularity. Bibasilar atelectasis. Lungs otherwise clear. No pulmonary infiltrate, pleural effusion or pneumothorax. Osseous structures unremarkable. IMPRESSION: Bibasilar atelectasis. Electronically Signed   By: Lavonia Dana M.D.   On: 12/06/2017 11:50     Medications:   . dextrose    . insulin (NOVOLIN-R) infusion     . clozapine  200 mg Oral BID  . divalproex  500 mg Oral BID  . heparin  5,000 Units Subcutaneous Q8H  . perphenazine  8 mg Oral BID  . prazosin  2 mg Oral Daily  . temazepam  30 mg Oral QHS  . [START ON 12/08/2017] Vitamin D  (Ergocalciferol)  50,000 Units Oral Weekly   acetaminophen **OR** acetaminophen, ondansetron **OR** ondansetron (ZOFRAN) IV  Assessment/ Plan:  52 y.o. male  with Schizoaffective disorder hypertension, GERD, prior episode of acute renal failure, diabetes mellitus type 2.    1.  Acute renal failure secondary to severe dehydration 2.  Hypernatremia. 3.  Alkalemia. 4.  Altered mental status, improving.  Plan: Suspect that the patient's acute renal failure was related to severe dehydration from his severe hyperglycemia.  Case discussed with pulmonary critical care and pharmacy.  We recommend that hydration be continued with D5W given serum sodium of 153.  He will likely need to go back on insulin drip.  Renal ultrasound was negative for hydronephrosis.  Potassium repletion has been appropriately ordered.  Mental status appears to be improving.   LOS: 1 Mishaal Lansdale 7/11/201910:24 AM

## 2017-12-07 NOTE — Progress Notes (Signed)
CRITICAL CARE NOTE  CC  follow up encephalopathy secondary to severe hyperglycemia  SUBJECTIVE Patient remains critically ill Prognosis is guarded 52 year old male who was found unresponsive in his home on 7/10. Patient presented to the ED with AMS, acute on chronic renal failure, and severe hyperglycemia with new onset diabetes. This morning the patient is difficult to arouse. Upon reassessment patient is sitting up in bed eating breakfast.   SIGNIFICANT EVENTS Patient has been lethargic since receiving his psychiatric medications last night. Patient has woken up a couple times during the night asking for water.    BP 117/80   Pulse 69   Temp 98.4 F (36.9 C) (Oral)   Resp (!) 22   Ht 5\' 11"  (1.803 m)   Wt 86.3 kg (190 lb 4.1 oz)   SpO2 98%   BMI 26.54 kg/m    REVIEW OF SYSTEMS  PATIENT IS UNABLE TO PROVIDE COMPLETE REVIEW OF SYSTEM S DUE TO SEVERE CRITICAL ILLNESS AND ENCEPHALOPATHY   PHYSICAL EXAMINATION:  GENERAL:critically ill appearing, -resp distress HEAD: Normocephalic, atraumatic.  EYES: Pupils equal, round, and reactive to light.  No scleral icterus.  MOUTH: Moist mucosal membrane. NECK: Supple. No thyromegaly. No nodules. No JVD.  PULMONARY: CTAB, -rhonchi, -wheezing CARDIOVASCULAR: S1 and S2. Regular rate and rhythm. No murmurs, rubs, or gallops.  GASTROINTESTINAL: Soft, nontender, -distended. No masses. Positive bowel sounds. No hepatosplenomegaly.  MUSCULOSKELETAL: No swelling, clubbing, or edema.  NEUROLOGIC: alert and oriented, following commands. SKIN:intact,warm,dry  ASSESSMENT AND PLAN SYNOPSIS  52 year old male with encephalopathy secondary to severe hyperglycemia and acute on chronic renal failure. Patient is able to eat breakfast independently this morning. Blood Glucose this morning is 146 with a Potassium of 2.8 and Sodium of 153. Patient's creatinine and BUN continue to improve with levels this morning of 4.63 and 55 respectively.   Severe  Hyperglycemia with new onset diabetes: -continue blood glucose monitoring, insulin drip, and D5W -follow BMP and ABG   Acute on Chronic Renal Failure-most likely hyperglycemia and dehydration -follow chem 7 -follow UO -Foley Catheter-assess need -nephrology consult  Hypokalemia -due to hyperglycemia correction, potassium infusion with repeat BMP  Hypernatremia: -due to correction of pseudohyponatremia secondary to hyperglycemia  -free water trial   NEUROLOGY: metabolic encephalopathy -improving this morning  Hypovolemic shock -blood pressure improved this morning: 117/80   CARDIAC ICU monitoring  Schizophrenia: -psych consult for continuation of current medication regimen vs. change   DVT/GI PRX ordered TRANSFUSIONS AS NEEDED MONITOR FSBS ASSESS the need for LABS as needed   Critical Care Time devoted to patient care services described in this note is 35 minutes.   Overall, patient is critically ill, prognosis is guarded.  Patient with Multiorgan failure and at high risk for cardiac arrest and death.

## 2017-12-07 NOTE — Progress Notes (Signed)
   12/07/17 1405  Clinical Encounter Type  Visited With Patient  Visit Type Initial   Patient called out to chaplain as she was passing by the room.  Patient requesting medicine and to take off 'all this.'  Chaplain offered to notify nurse as to patient's needs.  Nurse came into room during chaplain/patient engagement.

## 2017-12-07 NOTE — Consult Note (Signed)
[facility] Consultation      Name: Nicholas Cole MRN: [id number] DOB: 1965-10-12    ADMISSION DATE:  12/06/2017 CONSULTATION DATE:  12/07/2017   REFERRING MD :  Verdell Carmine   CHIEF COMPLAINT:  Acute encephlopathy   HISTORY OF PRESENT ILLNESS  Lethargic but arousable NA now 153 FSBS improved      Review of Systems  Unable to perform ROS: Mental acuity  All other systems reviewed and are negative.     VITAL SIGNS    Temp:  [97.2 F (36.2 C)-99.1 F (37.3 C)] 98.4 F (36.9 C) (07/11 0757) Pulse Rate:  [59-82] 71 (07/11 0900) Resp:  [13-31] 20 (07/11 1000) BP: (76-117)/(55-84) 107/74 (07/11 1000) SpO2:  [93 %-98 %] 98 % (07/11 1000) Weight:  [190 lb 4.1 oz (86.3 kg)] 190 lb 4.1 oz (86.3 kg) (07/10 1116)   Intake/Output Summary (Last 24 hours) at 12/07/2017 1105 Last data filed at 12/07/2017 1006 Gross per 24 hour  Intake 5756.34 ml  Output 600 ml  Net 5156.34 ml       PHYSICAL EXAM   Physical Exam  Constitutional: No distress.  HENT:  Head: Normocephalic and atraumatic.  Eyes: Pupils are equal, round, and reactive to light.  Neck: Normal range of motion. Neck supple.  Cardiovascular: Normal rate, regular rhythm, normal heart sounds and intact distal pulses.  Pulmonary/Chest: Effort normal and breath sounds normal.  Abdominal: Soft. Bowel sounds are normal.  Neurological:  lethargic  Skin: Skin is warm and dry.       LABS   LABS:  CBC Recent Labs  Lab 12/06/17 1118 12/07/17 0611  WBC 6.9 7.6  HGB 13.8 14.1  HCT 46.7 40.6  PLT 105* 101*   Coag's No results for input(s): APTT, INR in the last 168 hours. BMET Recent Labs  Lab 12/06/17 1944 12/07/17 0001 12/07/17 0611  NA 146* 153* 154*  K 3.0* 2.8* 3.2*  CL 113* 119* 121*  CO2 22 23 24   BUN 59* 55* 55*  CREATININE 5.11* 4.63* 4.88*  GLUCOSE 727* 277* 139*   Electrolytes Recent Labs  Lab 12/06/17 1944 12/07/17 0001 12/07/17 0611  CALCIUM 9.4 9.5  9.2   Sepsis Markers Recent Labs  Lab 12/06/17 1118 12/06/17 1606  LATICACIDVEN 1.9 3.1*   ABG Recent Labs  Lab 12/07/17 0619  PHART 7.34*  PCO2ART 39  PO2ART 74*   Liver Enzymes Recent Labs  Lab 12/06/17 1118  AST 16  ALT 13  ALKPHOS 88  BILITOT 1.7*  ALBUMIN 3.9   Cardiac Enzymes Recent Labs  Lab 12/06/17 1118  TROPONINI 0.03*   Glucose Recent Labs  Lab 12/07/17 0303 12/07/17 0352 12/07/17 0448 12/07/17 0503 12/07/17 0619 12/07/17 0831  GLUCAP 164* 154* 150* 173* 146* 202*     Recent Results (from the past 240 hour(s))  MRSA PCR Screening     Status: None   Collection Time: 12/06/17  4:00 PM  Result Value Ref Range Status   MRSA by PCR NEGATIVE NEGATIVE Final    Comment:        The GeneXpert MRSA Assay (FDA approved for NASAL specimens only), is one component of a comprehensive MRSA colonization surveillance program. It is not intended to diagnose MRSA infection nor to guide or monitor treatment for MRSA infections. Performed at [facility], [address] [place], [place] [address]      Current Facility-Administered Medications:  .  acetaminophen (TYLENOL) tablet 650 mg, 650 mg, Oral, Q6H PRN **OR** acetaminophen (TYLENOL)  suppository 650 mg, 650 mg, Rectal, Q6H PRN, Sainani, Belia Heman, MD .  cloZAPine (CLOZARIL) tablet 200 mg, 200 mg, Oral, BID, Henreitta Leber, MD, 200 mg at 12/07/17 0918 .  divalproex (DEPAKOTE) DR tablet 500 mg, 500 mg, Oral, BID, Henreitta Leber, MD, 500 mg at 12/07/17 0918 .  heparin injection 5,000 Units, 5,000 Units, Subcutaneous, Q8H, Henreitta Leber, MD, 5,000 Units at 12/07/17 0535 .  insulin aspart (novoLOG) injection 0-15 Units, 0-15 Units, Subcutaneous, TID WC, Mariaclara Spear, MD .  insulin aspart (novoLOG) injection 0-5 Units, 0-5 Units, Subcutaneous, QHS, Donnell Wion, Maretta Bees, MD .  Derrill Memo ON 12/08/2017] insulin glargine (LANTUS) injection 8 Units, 8 Units, Subcutaneous, Daily, Calven Gilkes, MD .   ondansetron (ZOFRAN) tablet 4 mg, 4 mg, Oral, Q6H PRN **OR** ondansetron (ZOFRAN) injection 4 mg, 4 mg, Intravenous, Q6H PRN, Sainani, Vivek J, MD .  perphenazine (TRILAFON) tablet 8 mg, 8 mg, Oral, BID, Henreitta Leber, MD, 8 mg at 12/07/17 0919 .  prazosin (MINIPRESS) capsule 2 mg, 2 mg, Oral, Daily, Henreitta Leber, MD, 2 mg at 12/07/17 0918 .  temazepam (RESTORIL) capsule 30 mg, 30 mg, Oral, QHS, Sainani, Belia Heman, MD, 30 mg at 12/06/17 2111 .  [START ON 12/08/2017] Vitamin D (Ergocalciferol) (DRISDOL) capsule 50,000 Units, 50,000 Units, Oral, Weekly, Sainani, Belia Heman, MD     ASSESSMENT/PLAN  52 yo with acute renal failure with DKA with hypovolumic shock with metabolic encephalopathy  PULMONARY Oxygen as needed  CARDIOVASCULAR VS stable   RENAL Renal Failure-most likely due to ATN -follow chem 7 -follow UO -continue Foley Catheter-assess need Nephrology consulted NA is 153-start free water therapy by mouth if tolerated   GASTROINTESTINAL Advance diet  as tolerated  ENDOCRINE Insulin drip protocol-wean off as tolerated May need d5W  Follow chem 7  NEUROLOGIC Encephalopathy Slowly improving  Consider transferring to gen med floor later today  Corrin Parker, M.D.  [facility]  Medical Director [place] Director [facility] Cardio-Pulmonary Department

## 2017-12-08 DIAGNOSIS — N179 Acute kidney failure, unspecified: Secondary | ICD-10-CM

## 2017-12-08 DIAGNOSIS — R739 Hyperglycemia, unspecified: Secondary | ICD-10-CM

## 2017-12-08 DIAGNOSIS — N189 Chronic kidney disease, unspecified: Secondary | ICD-10-CM

## 2017-12-08 LAB — GLUCOSE, CAPILLARY
GLUCOSE-CAPILLARY: 514 mg/dL — AB (ref 70–99)
GLUCOSE-CAPILLARY: 385 mg/dL — AB (ref 70–99)
GLUCOSE-CAPILLARY: 124 mg/dL — AB (ref 70–99)
Glucose-Capillary: 196 mg/dL — ABNORMAL HIGH (ref 70–99)
Glucose-Capillary: 273 mg/dL — ABNORMAL HIGH (ref 70–99)
Glucose-Capillary: 328 mg/dL — ABNORMAL HIGH (ref 70–99)
Glucose-Capillary: 420 mg/dL — ABNORMAL HIGH (ref 70–99)
Glucose-Capillary: 586 mg/dL (ref 70–99)
Glucose-Capillary: 70 mg/dL (ref 70–99)
Glucose-Capillary: >600 mg/dL (ref 70–99)
Glucose-Capillary: >600 mg/dL (ref 70–99)

## 2017-12-08 LAB — BASIC METABOLIC PANEL
Anion gap: 13 (ref 5–15)
BUN: 48 mg/dL — ABNORMAL HIGH (ref 6–20)
CO2: 18 mmol/L — ABNORMAL LOW (ref 22–32)
Calcium: 8.7 mg/dL — ABNORMAL LOW (ref 8.9–10.3)
Chloride: 114 mmol/L — ABNORMAL HIGH (ref 98–111)
Creatinine, Ser: 4.26 mg/dL — ABNORMAL HIGH (ref 0.61–1.24)
GFR calc Af Amer: 17 mL/min — ABNORMAL LOW (ref >60)
GFR calc non Af Amer: 15 mL/min — ABNORMAL LOW (ref >60)
Glucose, Bld: 423 mg/dL — ABNORMAL HIGH (ref 70–99)
Potassium: 3.7 mmol/L (ref 3.5–5.1)
Sodium: 145 mmol/L (ref 135–145)

## 2017-12-08 LAB — HIV ANTIBODY (ROUTINE TESTING W REFLEX): HIV Screen 4th Generation wRfx: NONREACTIVE

## 2017-12-08 LAB — HEMOGLOBIN A1C: Mean Plasma Glucose: >398 mg/dL

## 2017-12-08 LAB — GLUCOSE, RANDOM: Glucose, Bld: 694 mg/dL (ref 70–99)

## 2017-12-08 MED ORDER — LIVING WELL WITH DIABETES BOOK
Freq: Once | Status: AC
  Administered 2017-12-08: 10:00:00
  Filled 2017-12-08: qty 1

## 2017-12-08 MED ORDER — INSULIN GLARGINE 100 UNIT/ML ~~LOC~~ SOLN
20.0000 [IU] | Freq: Every day | SUBCUTANEOUS | Status: DC
  Administered 2017-12-08 – 2017-12-09 (×2): 20 [IU] via SUBCUTANEOUS
  Filled 2017-12-08 (×2): qty 0.2

## 2017-12-08 MED ORDER — INSULIN STARTER KIT- PEN NEEDLES (ENGLISH)
1.0000 | Freq: Once | Status: DC
  Filled 2017-12-08: qty 1

## 2017-12-08 MED ORDER — INSULIN GLARGINE 100 UNIT/ML ~~LOC~~ SOLN
20.0000 [IU] | Freq: Every day | SUBCUTANEOUS | Status: DC
  Filled 2017-12-08: qty 0.2

## 2017-12-08 MED ORDER — SODIUM CHLORIDE 0.9 % IV SOLN
INTRAVENOUS | Status: DC
  Administered 2017-12-08: 5.4 [IU]/h via INTRAVENOUS
  Filled 2017-12-08 (×2): qty 1

## 2017-12-08 MED ORDER — SODIUM CHLORIDE 0.9 % IV SOLN
INTRAVENOUS | Status: DC
  Administered 2017-12-08: via INTRAVENOUS

## 2017-12-08 MED ORDER — INSULIN ASPART 100 UNIT/ML ~~LOC~~ SOLN
0.0000 [IU] | Freq: Three times a day (TID) | SUBCUTANEOUS | Status: DC
  Administered 2017-12-08: 20 [IU] via SUBCUTANEOUS
  Filled 2017-12-08: qty 1

## 2017-12-08 MED ORDER — INSULIN ASPART 100 UNIT/ML ~~LOC~~ SOLN: 0.0000 [IU] | Freq: Every day | SUBCUTANEOUS | Status: DC

## 2017-12-08 NOTE — Progress Notes (Addendum)
Inpatient Diabetes Program Recommendations  AACE/ADA: New Consensus Statement on Inpatient Glycemic Control (2019)  Target Ranges:  Prepandial:   less than 140 mg/dL      Peak postprandial:   less than 180 mg/dL (1-2 hours)      Critically ill patients:  140 - 180 mg/dL  Results for Nicholas Cole, Nicholas Cole (MRN [id number]) as of 12/08/2017 15:12  Ref. Range 12/08/2017 07:26 12/08/2017 11:32 12/08/2017 13:43 12/08/2017 14:46  Glucose-Capillary Latest Ref Range: 70 - 99 mg/dL 420 (H) >600 (HH) >600 (HH) >600 (HH)  Results for Nicholas Cole, Nicholas Cole (MRN [id number]) as of 12/08/2017 15:12  Ref. Range 12/07/2017 05:03 12/07/2017 06:19 12/07/2017 08:31 12/07/2017 11:44 12/07/2017 16:40 12/07/2017 21:30  Glucose-Capillary Latest Ref Range: 70 - 99 mg/dL 173 (H) 146 (H) 202 (H) 258 (H) 354 (H) 281 (H)   Results for Nicholas Cole, Nicholas Cole (MRN [id number]) as of 12/08/2017 15:12  Ref. Range 12/06/2017 11:18 12/06/2017 16:34  Glucose Latest Ref Range: 70 - 99 mg/dL 945 (HH) 1,137 (HH)    Review of Glycemic Control  Diabetes history: NO Outpatient Diabetes medications: NA Current orders for Inpatient glycemic control: Lantus 20 units QHS, Novolog 0-20 units TID with meals, Novolog 0-5 units QHS  Inpatient Diabetes Program Recommendations: Insulin - Basal: Noted Lantus increased to 20 units QHS. Patient received Lantus 8 units at 4:29 am on 12/07/17 prior to transitioning off IV insulin drip. Recommend changing frequency of Lantus to 20 units daily (to start now). Insulin - Meal Coverage: Please consider ordering Novolog 4 units TID with meals for meal coverage. HgbA1C: Current A1C in process.  NOTE: Ordered Living Well with Diabetes book, RD consult for diet education, insulin starter kit, and patient education by bedside RNs. Will plan to see patient today.  Addendum 12/08/17_0 :00-Spoke with patient about new diabetes diagnosis. Patient has history of schizophrenia and bipolar disorder. Patient stated several times during the  conversation that he was tired and wanted to go to sleep. Patient was easily distracted and focused on taking a bath and having diarrhea. Patient states that he lives alone and [facility] helps him get to his doctor appointments and comes by to check on him.  Explained that he is being dx with DM. Patient states that his father had DM but he does not know if he took insulin or oral DM medications. Patient states that he does not read well but he can have his friends from church help him to read information. Patient has Living Well with DM book at bedside but he has not looked at the book yet.  Discussed A1C results (>15.5% on 12/07/17) and explained what an A1C is and informed patient that his current A1C indicates an average glucose of 398 mg/dl over the past 2-3 months. Used very simple language to discussed basic pathophysiology of DM Type 2, basic home care, importance of checking CBGs and maintaining good CBG control to prevent long-term and short-term complications. Reviewed glucose and A1C goals.  Reviewed signs and symptoms of hyperglycemia and hypoglycemia along with treatment for both. Discussed impact of nutrition, exercise, stress, sickness, and medications on diabetes control. Reviewed Living Well with diabetes booklet and encouraged patient to have friends from church read through entire book with him. Informed patient that he will need to monitor glucose and take insulin at home. Inquired about whether he has anyone to help him but he states he lives alone and does not have anyone that can help him at home. Patient states that he does not like needles but  he knows he is going to have to give hisself shots to stay healthy.  Asked patient to check his glucose 3-4 times per day (before meals and at bedtime) and to keep a log book of glucose readings and insulin taken. Discussed insulin administration with vial/syringe and insulin pen and attempted to show patient both ways to administer insulin but  patient reports that he wants to do whatever is easier and has the shortest needle. In order not to confuse patient and to make insulin as simple as possible, recommend using insulin pen as an outpatient. Demonstrated insulin administration with an insulin pen. Asked patient to demonstrate insulin pen use and he stated he wanted to go to sleep. Explained to patient that it was important for him to demonstrate and to review it over and over so he would know what to do at home. Patient reluctantly demonstrated how to use an insulin pen but had to be told every step multiple times.  Informed patient that RN will be asking him to check his glucose and self-administer insulin to ensure proper technique and ability to administer self insulin shots. Patient could not answer any questions regarding information discussed and has a very poor understanding. Inquired about someone that could be called to see if they could help with insulin at home and patient stated that that Katrina with [facility] services ([phone]) could be called and permission given to talk with her. Explained to patient that RNs will continue to talk with him about basic DM education  and engage patient to actively check blood glucose and administer insulin injections.  Very concerned about patient's ability to safely self-manage DM at home. Talked with Brooke, RN regarding conversation with patient and talked with Dr. Kalisetti as well. Patient is being restarted on the insulin drip as glucose is back over 600 mg/dl again. Will consult CM to see if patient can get home health RN to come out to ensure patient is able to properly take insulin at home. Called [facility] services and Katrina was not in the office but spoke with Chelsea. Chelsea reports that the patient has already called them to let them know he has diabetes and that he is going to need some help at home. Chelsea reports that [facility] takes him to this appointments and checks on him 2-3 times per  week. However, they are not able to provide any medical care or give any medications to the patient.   Thanks, Marie , RN, MSN, CDE Diabetes Coordinator Inpatient Diabetes Program [phone] (Team Pager from 8am to 5pm)   

## 2017-12-08 NOTE — Progress Notes (Signed)
Off insulin drip when initially seen this a.m.  However, developed very severe hyperglycemia and insulin drip resumed.  No new complaints. No distress  Vitals:   12/08/17 0800 12/08/17 0900 12/08/17 1000 12/08/17 1100  BP: 103/67 102/75 98/80   Pulse: 88  (!) 105   Resp: (!) 21 (!) 24 (!) 21 (!) 24  Temp: 98.1 F (36.7 C)     TempSrc: Oral     SpO2: 100%  97%   Weight:      Height:       Gen: NAD HEENT: NCAT, sclera white Neck: No JVD Lungs: breath sounds full, no wheezes or other adventitious sounds Cardiovascular: RRR, no murmurs Abdomen: Soft, nontender, normal BS Ext: without clubbing, cyanosis, edema Neuro: grossly intact Skin: Limited exam, no lesions noted  BMP Latest Ref Rng & Units 12/08/2017 12/08/2017 12/07/2017  Glucose 70 - 99 mg/dL 694(HH) 423(H) 139(H)  BUN 6 - 20 mg/dL - 48(H) 55(H)  Creatinine 0.61 - 1.24 mg/dL - 4.26(H) 4.88(H)  Sodium 135 - 145 mmol/L - 145 154(H)  Potassium 3.5 - 5.1 mmol/L - 3.7 3.2(L)  Chloride 98 - 111 mmol/L - 114(H) 121(H)  CO2 22 - 32 mmol/L - 18(L) 24  Calcium 8.9 - 10.3 mg/dL - 8.7(L) 9.2    CBC Latest Ref Rng & Units 12/07/2017 12/06/2017 03/13/2017  WBC 3.8 - 10.6 K/uL 7.6 6.9 5.8  Hemoglobin 13.0 - 18.0 g/dL 14.1 13.8 13.0  Hematocrit 40.0 - 52.0 % 40.6 46.7 38.3(L)  Platelets 150 - 440 K/uL 101(L) 105(L) 178    CBG (last 3)  Recent Labs    12/07/17 2130 12/08/17 0726 12/08/17 1132  GLUCAP 281* 420* >600*    No new CXR  IMPRESSION: Severe hyperglycemia - recurrent New diagnosis of type 2 diabetes Acute encephalopathy, resolved Renal failure - suspect AKI/CKD  PLAN/REC: Insulin infusion resumed Initiate Lantus Will try to transition off of insulin infusion again after glycemic control achieved Nephrology following  Merton Border, MD PCCM service Mobile [phone] Pager [phone] 12/08/2017 1:48 PM

## 2017-12-08 NOTE — Plan of Care (Signed)
  RD consulted for nutrition education regarding diabetes.   Lab Results  Component Value Date   HGBA1C >15.5 (H) 12/07/2017   Spoke with patient at bedside. He has a history of schizophrenia and bipolar disorder. It was difficult to make sure he was understand what this RD said. Continued to reinforce information and provided handouts. He said that he will give them to his caretaker to further ensure he gets appropriate nutrition therapy for his diabetes. Will likely need further reinforcement  RD provided "Carbohydrate Counting for People with Diabetes" handout from the Academy of Nutrition and Dietetics. Discussed different food groups and their effects on blood sugar, emphasizing carbohydrate-containing foods. Provided list of carbohydrates and recommended serving sizes of common foods.  Discussed importance of controlled and consistent carbohydrate intake throughout the day. Provided examples of ways to balance meals/snacks and encouraged intake of high-fiber, whole grain complex carbohydrates. Teach back method used.  Expect fair compliance.  Body mass index is 26.54 kg/m. Pt meets criteria for overweight based on current BMI.  Current diet order is carb modified, patient is consuming approximately 90-100% of meals at this time. Labs and medications reviewed. No further nutrition interventions warranted at this time. RD contact information provided. If additional nutrition issues arise, please re-consult RD.  Satira Anis. Nicholas Clingerman, MS, RD LDN Inpatient Clinical Dietitian Pager [phone]

## 2017-12-08 NOTE — Progress Notes (Signed)
[facility]  ROUNDING NOTE   Subjective:  Patient sitting up in bed. Eating breakfast at the moment. Renal function slightly improved. Urine output was 1.1 L over the preceding 24 hours.  Objective:  Vital signs in last 24 hours:  Temp:  [98.2 F (36.8 C)-98.4 F (36.9 C)] 98.2 F (36.8 C) (07/11 1400) Pulse Rate:  [71-87] 87 (07/12 0700) Resp:  [16-26] 19 (07/12 0700) BP: (89-113)/(55-83) 100/68 (07/12 0700) SpO2:  [94 %-100 %] 100 % (07/12 0700)  Weight change:  Filed Weights   12/06/17 1116  Weight: 86.3 kg (190 lb 4.1 oz)    Intake/Output: I/O last 3 completed shifts: In: 2735.5 [P.O.:960; I.V.:1522.2; IV Piggyback:253.3] Out: 1150 [Urine:1150]   Intake/Output this shift:  No intake/output data recorded.  Physical Exam: General: No acute distress  Head: Normocephalic, atraumatic. Moist oral mucosal membranes  Eyes: Anicteric  Neck: Supple, trachea midline  Lungs:  Clear to auscultation, normal effort  Heart: S1S2 no rubs  Abdomen:  Soft, nontender, bowel sounds present  Extremities: No peripheral edema.  Neurologic: Awake, alert, following commands  Skin: No lesions       Basic Metabolic Panel: Recent Labs  Lab 12/06/17 1634 12/06/17 1944 12/07/17 0001 12/07/17 0611 12/08/17 0625  NA 138 146* 153* 154* 145  K 2.7* 3.0* 2.8* 3.2* 3.7  CL 104 113* 119* 121* 114*  CO2 '23 22 23 24 '$ 18*  GLUCOSE 1,137* 727* 277* 139* 423*  BUN 62* 59* 55* 55* 48*  CREATININE 5.22* 5.11* 4.63* 4.88* 4.26*  CALCIUM 9.1 9.4 9.5 9.2 8.7*    Liver Function Tests: Recent Labs  Lab 12/06/17 1118  AST 16  ALT 13  ALKPHOS 88  BILITOT 1.7*  PROT 7.1  ALBUMIN 3.9   No results for input(s): LIPASE, AMYLASE in the last 168 hours. No results for input(s): AMMONIA in the last 168 hours.  CBC: Recent Labs  Lab 12/06/17 1118 12/07/17 0611  WBC 6.9 7.6  NEUTROABS 6.0  --   HGB 13.8 14.1  HCT 46.7 40.6  MCV 101.0* 86.5  PLT 105* 101*    Cardiac  Enzymes: Recent Labs  Lab 12/06/17 1118 12/06/17 1201  CKTOTAL  --  702*  TROPONINI 0.03*  --     BNP: Invalid input(s): POCBNP  CBG: Recent Labs  Lab 12/07/17 0831 12/07/17 1144 12/07/17 1640 12/07/17 2130 12/08/17 0726  GLUCAP 202* 258* 354* 281* 75*    Microbiology: Results for orders placed or performed during the hospital encounter of 12/06/17  MRSA PCR Screening     Status: None   Collection Time: 12/06/17  4:00 PM  Result Value Ref Range Status   MRSA by PCR NEGATIVE NEGATIVE Final    Comment:        The GeneXpert MRSA Assay (FDA approved for NASAL specimens only), is one component of a comprehensive MRSA colonization surveillance program. It is not intended to diagnose MRSA infection nor to guide or monitor treatment for MRSA infections. Performed at [facility], [address] [place], [place] [id number]     Coagulation Studies: No results for input(s): LABPROT, INR in the last 72 hours.  Urinalysis: Recent Labs    12/06/17 1118  COLORURINE STRAW*  LABSPEC 1.025  PHURINE 6.0  GLUCOSEU >=500*  HGBUR LARGE*  BILIRUBINUR NEGATIVE  KETONESUR 5*  PROTEINUR NEGATIVE  NITRITE NEGATIVE  LEUKOCYTESUR NEGATIVE      Imaging: Ct Head Wo Contrast  Result Date: 12/06/2017 CLINICAL DATA:  Altered mental status with  lethargy and hyperglycemia. History of diabetes and renal failure. EXAM: CT HEAD WITHOUT CONTRAST TECHNIQUE: Contiguous axial images were obtained from the base of the skull through the vertex without intravenous contrast. COMPARISON:  CT head 11/05/2016. FINDINGS: Brain: There is no evidence of acute intracranial hemorrhage, mass lesion, brain edema or extra-axial fluid collection. The ventricles and subarachnoid spaces are appropriately sized for age. There is no CT evidence of acute cortical infarction. Vascular:  No hyperdense vessel identified. Skull: Negative for fracture or focal lesion. Sinuses/Orbits: The visualized paranasal  sinuses and mastoid air cells are clear. No orbital abnormalities are seen. Other: None. IMPRESSION: Stable unremarkable noncontrast head CT. Electronically Signed   By: Richardean Sale M.D.   On: 12/06/2017 12:58   US Renal  Result Date: 12/06/2017 CLINICAL DATA:  Initial evaluation for acute renal failure. EXAM: RENAL / URINARY TRACT ULTRASOUND COMPLETE COMPARISON:  [facility] CT from 01/27/2017. FINDINGS: Right Kidney: Length: 9.0 cm. Echogenicity within normal limits. No mass or hydronephrosis visualized. Left Kidney: Length: 9.5 cm. Echogenicity within normal limits. No hydronephrosis. 1.1 x 1.2 x 1.0 cm simple cyst present within the interpolar region. Bladder: Appears normal for degree of bladder distention. IMPRESSION: 1. No hydronephrosis. 2. 1.2 cm simple cyst within the interpolar left kidney. Electronically Signed   By: Jeannine Boga M.D.   On: 12/06/2017 15:33   Dg Chest Portable 1 View  Result Date: 12/06/2017 CLINICAL DATA:  Lethargy, unable to answer questions, hyperglycemia, diabetes mellitus, chronic renal failure, former smoker EXAM: PORTABLE CHEST 1 VIEW COMPARISON:  Portable exam 1140 hours compared to 11/16/2016 FINDINGS: Normal heart size, mediastinal contours, and pulmonary vascularity. Bibasilar atelectasis. Lungs otherwise clear. No pulmonary infiltrate, pleural effusion or pneumothorax. Osseous structures unremarkable. IMPRESSION: Bibasilar atelectasis. Electronically Signed   By: Lavonia Dana M.D.   On: 12/06/2017 11:50     Medications:    . clozapine  200 mg Oral BID  . divalproex  500 mg Oral BID  . heparin  5,000 Units Subcutaneous Q8H  . insulin aspart  0-20 Units Subcutaneous TID WC  . insulin aspart  0-5 Units Subcutaneous QHS  . insulin glargine  20 Units Subcutaneous Daily  . insulin starter kit- pen needles  1 kit Other Once  . living well with diabetes book   Does not apply Once  . perphenazine  8 mg Oral BID  . prazosin  2 mg Oral Daily  . temazepam   30 mg Oral QHS  . Vitamin D (Ergocalciferol)  50,000 Units Oral Weekly   acetaminophen **OR** [DISCONTINUED] acetaminophen, [DISCONTINUED] ondansetron **OR** ondansetron (ZOFRAN) IV  Assessment/ Plan:  52 y.o. male  with Schizoaffective disorder hypertension, GERD, prior episode of acute renal failure, diabetes mellitus type 2.    1.  Acute renal failure secondary to severe dehydration 2.  Hypernatremia, improved 3.  Acidosis. 4.  Altered mental status, improving.  Plan: The patient's renal function is slowly improving.  Creatinine down to 4.26.  Urine output was 1.1 L over the preceding 24 hours.  Therefore no acute indication for dialysis at the moment.  Renal ultrasound was negative for hydronephrosis.  Patient is able to take and adequate amounts of fluid himself now.  He is currently off of IV fluids.  Insulin control as per hospitalist.  Further plan as patient progresses.   LOS: 2 Nicholas Cole 7/12/201910:17 AM

## 2017-12-08 NOTE — Progress Notes (Signed)
[place] [facility] [place] NAME: Nicholas Cole    MR#:  [id number]  DATE OF BIRTH:  1965/06/03  SUBJECTIVE:  CHIEF COMPLAINT:   Chief Complaint  Patient presents with  . Hyperglycemia   - sugars still elevated, restarted on insulin drip now - with his mental illness- hard to educate- he has been non compliant with food  REVIEW OF SYSTEMS:  Review of Systems  Constitutional: Negative for chills, fever and malaise/fatigue.  HENT: Negative for hearing loss.   Eyes: Positive for blurred vision. Negative for double vision.  Respiratory: Positive for shortness of breath. Negative for cough and wheezing.   Cardiovascular: Negative for chest pain and palpitations.  Gastrointestinal: Negative for abdominal pain, constipation, diarrhea, nausea and vomiting.  Genitourinary: Negative for dysuria.  Musculoskeletal: Positive for myalgias. Negative for back pain.  Neurological: Negative for dizziness, focal weakness, seizures, weakness and headaches.  Psychiatric/Behavioral: Negative for depression.    DRUG ALLERGIES:   Allergies  Allergen Reactions  . Valproic Acid Rash    hyperammonemia    VITALS:  Blood pressure 98/80, pulse (!) 105, temperature 98.1 F (36.7 C), temperature source Oral, resp. rate (!) 24, height 5\' 11"  (1.803 m), weight 86.3 kg (190 lb 4.1 oz), SpO2 97 %.  PHYSICAL EXAMINATION:  Physical Exam  GENERAL:  52 y.o.-year-old patient lying in the bed with no acute distress.  EYES: Pupils equal, round, reactive to light and accommodation. No scleral icterus. Extraocular muscles intact.  HEENT: Head atraumatic, normocephalic. Oropharynx and nasopharynx clear.  NECK:  Supple, no jugular venous distention. No thyroid enlargement, no tenderness.  LUNGS: coarse breath sounds bilaterally, with scattered wheezing,no rales,rhonchi or crepitation. No use of accessory muscles of respiration.  CARDIOVASCULAR: S1, S2 normal. No  rubs, or  gallops. 2/6 systolic murmur present ABDOMEN: Soft, nontender, nondistended. Bowel sounds present. No organomegaly or mass.  EXTREMITIES: No pedal edema, cyanosis, or clubbing.  NEUROLOGIC: Cranial nerves II through XII are intact. Decreased visual acuity.  Muscle strength 5/5 in all extremities. Sensation intact. Gait not checked.  PSYCHIATRIC: The patient is alert and oriented x 3.  intermittent confusion noted. Poor judgement SKIN: No obvious rash, lesion, or ulcer.    LABORATORY PANEL:   CBC Recent Labs  Lab 12/07/17 0611  WBC 7.6  HGB 14.1  HCT 40.6  PLT 101*   ------------------------------------------------------------------------------------------------------------------  Chemistries  Recent Labs  Lab 12/06/17 1118  12/08/17 0625 12/08/17 1146  NA 120*   < > 145  --   K 4.2   < > 3.7  --   CL 81*   < > 114*  --   CO2 21*   < > 18*  --   GLUCOSE 945*   < > 423* 694*  BUN 73*   < > 48*  --   CREATININE 6.16*   < > 4.26*  --   CALCIUM 8.6*   < > 8.7*  --   AST 16  --   --   --   ALT 13  --   --   --   ALKPHOS 88  --   --   --   BILITOT 1.7*  --   --   --    < > = values in this interval not displayed.   ------------------------------------------------------------------------------------------------------------------  Cardiac Enzymes Recent Labs  Lab 12/06/17 1118  TROPONINI 0.03*   ------------------------------------------------------------------------------------------------------------------  RADIOLOGY:  US Renal  Result Date: 12/06/2017 CLINICAL DATA:  Initial evaluation  for acute renal failure. EXAM: RENAL / URINARY TRACT ULTRASOUND COMPLETE COMPARISON:  [facility] CT from 01/27/2017. FINDINGS: Right Kidney: Length: 9.0 cm. Echogenicity within normal limits. No mass or hydronephrosis visualized. Left Kidney: Length: 9.5 cm. Echogenicity within normal limits. No hydronephrosis. 1.1 x 1.2 x 1.0 cm simple cyst present within the interpolar region. Bladder:  Appears normal for degree of bladder distention. IMPRESSION: 1. No hydronephrosis. 2. 1.2 cm simple cyst within the interpolar left kidney. Electronically Signed   By: Jeannine Boga M.D.   On: 12/06/2017 15:33    EKG:   Orders placed or performed during the hospital encounter of 12/06/17  . EKG 12-Lead  . EKG 12-Lead  . ED EKG  . ED EKG  . EKG 12-Lead  . EKG 12-Lead    ASSESSMENT AND PLAN:   52 year old male with past medical history significant for schizophrenia, bipolar, hypertension presents to hospitals secondary to altered mental status.  1. Altered mental status-uremic encephalopathy and metabolic encephalopathy -received fluids,  -No acute indication for hemodialysis. -Mental status is improving  2. Diabetic ketoacidosis-no known history of diabetes. a1c >15 - restarted on insulin drip as sugars elevated again-and has been noncompliant with diet -sugars greater than 1100 on admission - Diabetes coordinator consult -will need insulin at discharge  3. Acute renal failure-appreciate nephrology consult. Urine output has improved. Renal ultrasound with no hydronephrosis -creatinine is slowly improving -has underlying CKD, baseline creatinine of 2.6 with GFR of 31.  4. Hypernatremia-recommend D5 fluids and might need insulin drip if sugars are starting to get elevated because of that  5. Schizophrenia and bipolar-continue outpatient medications. -Patient on Depakote, Clozaril and perphenazine  6. DVT Prophylaxis- SQ heparin  Independent at baseline, recommend PT consult   All the records are reviewed and case discussed with Care Management/Social Workerr. Management plans discussed with the patient, family and they are in agreement.  CODE STATUS: Full code  TOTAL TIME TAKING CARE OF THIS PATIENT: 36 minutes.   POSSIBLE D/C IN 2-3 DAYS, DEPENDING ON CLINICAL CONDITION.   Gladstone Lighter M.D on 12/08/2017 at 1:58 PM  Between 7am to 6pm - Pager -  [phone]  After 6pm go to [email or web address] - password [email or web address]  Office  [phone]  CC: Primary care physician; Patient, No Pcp Per

## 2017-12-09 LAB — BASIC METABOLIC PANEL
Anion gap: 11 (ref 5–15)
BUN: 47 mg/dL — AB (ref 6–20)
CALCIUM: 8.7 mg/dL — AB (ref 8.9–10.3)
CO2: 22 mmol/L (ref 22–32)
CREATININE: 3.43 mg/dL — AB (ref 0.61–1.24)
Chloride: 114 mmol/L — ABNORMAL HIGH (ref 98–111)
GFR calc Af Amer: 22 mL/min — ABNORMAL LOW (ref >60)
GFR, EST NON AFRICAN AMERICAN: 19 mL/min — AB (ref >60)
Glucose, Bld: 203 mg/dL — ABNORMAL HIGH (ref 70–99)
Potassium: 3 mmol/L — ABNORMAL LOW (ref 3.5–5.1)
SODIUM: 147 mmol/L — AB (ref 135–145)

## 2017-12-09 LAB — GLUCOSE, CAPILLARY
GLUCOSE-CAPILLARY: 192 mg/dL — AB (ref 70–99)
GLUCOSE-CAPILLARY: 393 mg/dL — AB (ref 70–99)
GLUCOSE-CAPILLARY: 303 mg/dL — AB (ref 70–99)
Glucose-Capillary: 178 mg/dL — ABNORMAL HIGH (ref 70–99)
Glucose-Capillary: 337 mg/dL — ABNORMAL HIGH (ref 70–99)
Glucose-Capillary: 318 mg/dL — ABNORMAL HIGH (ref 70–99)

## 2017-12-09 LAB — PHOSPHORUS: PHOSPHORUS: 5 mg/dL — AB (ref 2.5–4.6)

## 2017-12-09 LAB — MAGNESIUM: MAGNESIUM: 2.2 mg/dL (ref 1.7–2.4)

## 2017-12-09 LAB — POTASSIUM: Potassium: 3.9 mmol/L (ref 3.5–5.1)

## 2017-12-09 MED ORDER — INSULIN GLARGINE 100 UNIT/ML ~~LOC~~ SOLN
10.0000 [IU] | Freq: Once | SUBCUTANEOUS | Status: AC
  Administered 2017-12-09: 10 [IU] via SUBCUTANEOUS
  Filled 2017-12-09: qty 0.1

## 2017-12-09 MED ORDER — INSULIN ASPART 100 UNIT/ML ~~LOC~~ SOLN
0.0000 [IU] | SUBCUTANEOUS | Status: DC
  Administered 2017-12-09 (×2): 3 [IU] via SUBCUTANEOUS
  Filled 2017-12-09 (×2): qty 1

## 2017-12-09 MED ORDER — SODIUM CHLORIDE 0.45 % IV SOLN
INTRAVENOUS | Status: DC
  Administered 2017-12-09 – 2017-12-10 (×4): via INTRAVENOUS

## 2017-12-09 MED ORDER — INSULIN GLARGINE 100 UNIT/ML ~~LOC~~ SOLN
30.0000 [IU] | Freq: Every day | SUBCUTANEOUS | Status: DC
  Filled 2017-12-09: qty 0.3

## 2017-12-09 MED ORDER — POTASSIUM CHLORIDE 20 MEQ PO PACK
60.0000 meq | PACK | Freq: Two times a day (BID) | ORAL | Status: AC
  Administered 2017-12-09 (×2): 60 meq via ORAL
  Filled 2017-12-09 (×2): qty 3

## 2017-12-09 MED ORDER — INSULIN ASPART 100 UNIT/ML ~~LOC~~ SOLN
0.0000 [IU] | Freq: Every day | SUBCUTANEOUS | Status: DC
  Administered 2017-12-09: 21:00:00 4 [IU] via SUBCUTANEOUS
  Administered 2017-12-10: 2 [IU] via SUBCUTANEOUS
  Administered 2017-12-11: 22:00:00 3 [IU] via SUBCUTANEOUS
  Filled 2017-12-09 (×3): qty 1

## 2017-12-09 MED ORDER — INSULIN ASPART 100 UNIT/ML ~~LOC~~ SOLN
0.0000 [IU] | Freq: Three times a day (TID) | SUBCUTANEOUS | Status: DC
  Administered 2017-12-09: 20 [IU] via SUBCUTANEOUS
  Administered 2017-12-09: 17:00:00 15 [IU] via SUBCUTANEOUS
  Administered 2017-12-10: 09:00:00 11 [IU] via SUBCUTANEOUS
  Administered 2017-12-10: 18:00:00 4 [IU] via SUBCUTANEOUS
  Administered 2017-12-10: 13:00:00 20 [IU] via SUBCUTANEOUS
  Administered 2017-12-11: 15 [IU] via SUBCUTANEOUS
  Administered 2017-12-11: 17:00:00 7 [IU] via SUBCUTANEOUS
  Administered 2017-12-11: 11 [IU] via SUBCUTANEOUS
  Administered 2017-12-12: 09:00:00 7 [IU] via SUBCUTANEOUS
  Administered 2017-12-12: 15 [IU] via SUBCUTANEOUS
  Filled 2017-12-09 (×10): qty 1

## 2017-12-09 NOTE — Progress Notes (Signed)
Pt has remained alert and oriented with no c/o pain. Pt has remained on RA-lung sounds clear to auscultation-SpO2 >95%. Pt has remained in NSR, BP/HR WNL. Pt dangles on side of bed-uses urinal as needed.  Pt with a carb mod diet; however, frequently requests milk and OJ with denial-sugars have increased despite resistant SSI-Dr Alva Garnet has increased lantus to 30U daily. Pt will require frequent and repeated education regarding his new DM dx. Education has been provided with each insulin dose and meal introduction. Pt with orders to transfer to med-surg.

## 2017-12-09 NOTE — Progress Notes (Signed)
[facility]  ROUNDING NOTE   Subjective:  Renal function slowly improving. Creatinine down to 3.43. Urine output was good at 1.7 L over the preceding 24 hours. Serum sodium slightly high at 147. Blood sugar still high at 203.  The  Objective:  Vital signs in last 24 hours:  Temp:  [98 F (36.7 C)-98.7 F (37.1 C)] 98 F (36.7 C) (07/13 0800) Pulse Rate:  [87-105] 105 (07/13 0800) Resp:  [16-31] 16 (07/13 0800) BP: (87-115)/(51-80) 102/63 (07/13 0800) SpO2:  [93 %-100 %] 93 % (07/13 0800)  Weight change:  Filed Weights   12/06/17 1116  Weight: 86.3 kg (190 lb 4.1 oz)    Intake/Output: I/O last 3 completed shifts: In: 1084.5 [P.O.:480; I.V.:604.5] Out: 2050 [Urine:2050]   Intake/Output this shift:  Total I/O In: 360 [P.O.:360] Out: 300 [Urine:300]  Physical Exam: General: No acute distress  Head: Normocephalic, atraumatic. Moist oral mucosal membranes  Eyes: Anicteric  Neck: Supple, trachea midline  Lungs:  Clear to auscultation, normal effort  Heart: S1S2 no rubs  Abdomen:  Soft, nontender, bowel sounds present  Extremities: No peripheral edema.  Neurologic: Awake, alert, following commands  Skin: No lesions       Basic Metabolic Panel: Recent Labs  Lab 12/06/17 1944 12/07/17 0001 12/07/17 0611 12/08/17 0625 12/08/17 1146 12/09/17 0546  NA 146* 153* 154* 145  --  147*  K 3.0* 2.8* 3.2* 3.7  --  3.0*  CL 113* 119* 121* 114*  --  114*  CO2 _0 18*  --  22  GLUCOSE 727* 277* 139* 423* 694* 203*  BUN 59* 55* 55* 48*  --  47*  CREATININE 5.11* 4.63* 4.88* 4.26*  --  3.43*  CALCIUM 9.4 9.5 9.2 8.7*  --  8.7*  MG  --   --   --   --   --  2.2  PHOS  --   --   --   --   --  5.0*    Liver Function Tests: Recent Labs  Lab 12/06/17 1118  AST 16  ALT 13  ALKPHOS 88  BILITOT 1.7*  PROT 7.1  ALBUMIN 3.9   No results for input(s): LIPASE, AMYLASE in the last 168 hours. No results for input(s): AMMONIA in the last 168  hours.  CBC: Recent Labs  Lab 12/06/17 1118 12/07/17 0611  WBC 6.9 7.6  NEUTROABS 6.0  --   HGB 13.8 14.1  HCT 46.7 40.6  MCV 101.0* 86.5  PLT 105* 101*    Cardiac Enzymes: Recent Labs  Lab 12/06/17 1118 12/06/17 1201  CKTOTAL  --  702*  TROPONINI 0.03*  --     BNP: Invalid input(s): POCBNP  CBG: Recent Labs  Lab 12/08/17 2142 12/08/17 2251 12/08/17 2343 12/09/17 0327 12/09/17 0732  GLUCAP 196* 124* 39 178* 192*    Microbiology: Results for orders placed or performed during the hospital encounter of 12/06/17  MRSA PCR Screening     Status: None   Collection Time: 12/06/17  4:00 PM  Result Value Ref Range Status   MRSA by PCR NEGATIVE NEGATIVE Final    Comment:        The GeneXpert MRSA Assay (FDA approved for NASAL specimens only), is one component of a comprehensive MRSA colonization surveillance program. It is not intended to diagnose MRSA infection nor to guide or monitor treatment for MRSA infections. Performed at [facility], [address], [place], [place] [address]     Coagulation  Studies: No results for input(s): LABPROT, INR in the last 72 hours.  Urinalysis: Recent Labs    12/06/17 1118  COLORURINE STRAW*  LABSPEC 1.025  PHURINE 6.0  GLUCOSEU >=500*  HGBUR LARGE*  BILIRUBINUR NEGATIVE  KETONESUR 5*  PROTEINUR NEGATIVE  NITRITE NEGATIVE  LEUKOCYTESUR NEGATIVE      Imaging: No results found.   Medications:   . sodium chloride 75 mL/hr at 12/09/17 0700   . clozapine  200 mg Oral BID  . divalproex  500 mg Oral BID  . heparin  5,000 Units Subcutaneous Q8H  . insulin aspart  0-15 Units Subcutaneous Q4H  . insulin glargine  20 Units Subcutaneous Daily  . insulin starter kit- pen needles  1 kit Other Once  . perphenazine  8 mg Oral BID  . potassium chloride  60 mEq Oral BID  . prazosin  2 mg Oral Daily  . temazepam  30 mg Oral QHS  . Vitamin D (Ergocalciferol)  50,000 Units Oral Weekly   acetaminophen  **OR** [DISCONTINUED] acetaminophen, [DISCONTINUED] ondansetron **OR** ondansetron (ZOFRAN) IV  Assessment/ Plan:  52 y.o. male  with Schizoaffective disorder hypertension, GERD, prior episode of acute renal failure, diabetes mellitus type 2.    1.  Acute renal failure secondary to severe dehydration 2.  Hypernatremia, Na 147 3.  Acidosis. 4.  Altered mental status, improving. 5.  Hypokalemia.   Plan: Renal function slow to improve.  Creatinine down to 3.43.  Still has periods of hyperglycemia.  Change normal saline to half-normal saline at 75 cc/h as serum sodium high at 147.  Potassium repletion being performed.  Insulin management as per hospitalist.  Further plan as patient progresses.   LOS: 3 Kandyce Dieguez 7/13/20199:54 AM

## 2017-12-09 NOTE — Evaluation (Signed)
Physical Therapy Evaluation Patient Details Name: Nicholas Cole MRN: [id number] DOB: 1965/09/26 Today's Date: 12/09/2017   History of Present Illness  Pt is a 52 y.o. male presenting to hospital 12/06/17 with AMS and severely hyperglycemic (>900).  Pt admitted with metabolic encephalopathy, acute DKA, acute on chronic renal failure, and hyponatremia.  PMH includes acute renal failure, DM, schizophrenia, htn, bipolar disorder, and bipolar disorder.  Clinical Impression  Prior to hospital admission, pt was independent with ambulation/functional mobility; has ACT assistance.  Pt lives alone in 2nd floor apt with 17 steps with B railings to apt.  Currently pt is modified independent with bed mobility, CGA with transfers, and CGA with RW ambulating around ICU loop.  Pt requesting to use RW d/t feeling more steady with it.  No loss of balance during session using RW.  Pt would benefit from skilled PT to address noted impairments and functional limitations (see below for any additional details).  Upon hospital discharge, recommend pt discharge to home with HHPT.    Follow Up Recommendations Home health PT    Equipment Recommendations  Rolling walker with 5" wheels    Recommendations for Other Services       Precautions / Restrictions Precautions Precautions: Fall Restrictions Weight Bearing Restrictions: No      Mobility  Bed Mobility Overal bed mobility: Modified Independent             General bed mobility comments: Supine to/from sit with HOB elevated without any noted difficulties.  Transfers Overall transfer level: Needs assistance Equipment used: Rolling walker (2 wheeled) Transfers: Sit to/from Stand Sit to Stand: Min guard         General transfer comment: steady strong transfers with RW  Ambulation/Gait Ambulation/Gait assistance: Min guard Gait Distance (Feet): 225 Feet Assistive device: Rolling walker (2 wheeled) Gait Pattern/deviations: Step-through pattern      General Gait Details: steady with RW  Stairs            Wheelchair Mobility    Modified Rankin (Stroke Patients Only)       Balance Overall balance assessment: Needs assistance Sitting-balance support: No upper extremity supported;Feet supported Sitting balance-Leahy Scale: Normal Sitting balance - Comments: steady sitting reaching outside BOS   Standing balance support: No upper extremity supported Standing balance-Leahy Scale: Good Standing balance comment: steady standing reaching within BOS                             Pertinent Vitals/Pain Pain Assessment: No/denies pain  Vitals (HR and O2 on room air) stable and WFL throughout treatment session.    Home Living Family/patient expects to be discharged to:: Private residence Living Arrangements: Alone Available Help at Discharge: Other (Comment)(ACT assistance) Type of Home: Apartment(2nd floor apt) Home Access: Stairs to enter Entrance Stairs-Rails: Right;Left Entrance Stairs-Number of Steps: 17 Home Layout: One level Home Equipment: Grab bars - tub/shower;Cane - single point      Prior Function Level of Independence: Independent         Comments: Pt reports 2 falls in past 6 months d/t "shaking"     Hand Dominance        Extremity/Trunk Assessment   Upper Extremity Assessment Upper Extremity Assessment: Generalized weakness    Lower Extremity Assessment Lower Extremity Assessment: Generalized weakness    Cervical / Trunk Assessment Cervical / Trunk Assessment: Normal  Communication   Communication: No difficulties  Cognition Arousal/Alertness: Awake/alert   Overall Cognitive Status: No  family/caregiver present to determine baseline cognitive functioning                                 General Comments: Scattered thoughts but focused other times      General Comments  Pt noted to have orange juice in room and therapist questioned if pt should have it;  nurse immediately notified and reported pt unable to have and nurse immediately went into pt's room to take orange juice from pt.    Exercises  Ambulation   Assessment/Plan    PT Assessment Patient needs continued PT services  PT Problem List Decreased strength;Decreased balance;Decreased mobility;Decreased safety awareness       PT Treatment Interventions DME instruction;Gait training;Stair training;Functional mobility training;Therapeutic activities;Therapeutic exercise;Balance training;Patient/family education    PT Goals (Current goals can be found in the Care Plan section)  Acute Rehab PT Goals Patient Stated Goal: to walk more PT Goal Formulation: With patient Time For Goal Achievement: 12/23/17 Potential to Achieve Goals: Good    Frequency Min 2X/week   Barriers to discharge        Co-evaluation               AM-PAC PT "6 Clicks" Daily Activity  Outcome Measure Difficulty turning over in bed (including adjusting bedclothes, sheets and blankets)?: None Difficulty moving from lying on back to sitting on the side of the bed? : None Difficulty sitting down on and standing up from a chair with arms (e.g., wheelchair, bedside commode, etc,.)?: None Help needed moving to and from a bed to chair (including a wheelchair)?: A Little Help needed walking in hospital room?: A Little Help needed climbing 3-5 steps with a railing? : A Little 6 Click Score: 21    End of Session Equipment Utilized During Treatment: Gait belt Activity Tolerance: Patient tolerated treatment well Patient left: in bed;with call bell/phone within reach;with bed alarm set;with nursing/sitter in room Nurse Communication: Mobility status;Precautions PT Visit Diagnosis: Other abnormalities of gait and mobility (R26.89);Unsteadiness on feet (R26.81);Muscle weakness (generalized) (M62.81);History of falling (Z91.81)    Time: 4158-3094 PT Time Calculation (min) (ACUTE ONLY): 28 min   Charges:   PT  Evaluation $PT Eval Low Complexity: 1 Low PT Treatments $Therapeutic Exercise: 8-22 mins   PT G CodesLeitha Bleak, PT 12/09/17, 2:17 PM [phone]

## 2017-12-09 NOTE — Progress Notes (Signed)
He is off insulin drip and glycemic control is adequate.  Transfer order placed.  All other issues are being addressed by primary team and nephrology.  After transfer, PCCM will sign off. Please call if we can be of further assistance    Merton Border, MD PCCM service Mobile [phone] Pager [phone] 12/09/2017 11:46 AM

## 2017-12-09 NOTE — Progress Notes (Signed)
Chaplain was referred by nurse because Pt was calling out. Nurse said there may be mental issues. Pt was receiving dinner. Pt engaged chaplain with story-telling before turning attention to meal. Chaplain offered to  Pray, Pt agreed. Chaplain asked what would he like to pray for" whatever you want". Chaplain prayed a blessing of the food and companionship for the Pt. Chaplain will follow.     12/09/17 1200  Clinical Encounter Type  Visited With Patient  Visit Type Spiritual support  Referral From Nurse  Spiritual Encounters  Spiritual Needs Prayer

## 2017-12-09 NOTE — Progress Notes (Signed)
[place] [facility] [place] NAME: Nicholas Cole    MR#:  [id number]  DATE OF BIRTH:  1966/05/27  SUBJECTIVE:  CHIEF COMPLAINT:   Chief Complaint  Patient presents with  . Hyperglycemia   - doing well today, off inuslin drip - educated about low carb diet - voiding well.  REVIEW OF SYSTEMS:  Review of Systems  Constitutional: Negative for chills, fever and malaise/fatigue.  HENT: Negative for hearing loss.   Eyes: Positive for blurred vision. Negative for double vision.  Respiratory: Negative for cough, shortness of breath and wheezing.   Cardiovascular: Negative for chest pain and palpitations.  Gastrointestinal: Negative for abdominal pain, constipation, diarrhea, nausea and vomiting.  Genitourinary: Negative for dysuria.  Musculoskeletal: Positive for myalgias. Negative for back pain.  Neurological: Negative for dizziness, focal weakness, seizures, weakness and headaches.  Psychiatric/Behavioral: Negative for depression.    DRUG ALLERGIES:   Allergies  Allergen Reactions  . Valproic Acid Rash    hyperammonemia    VITALS:  Blood pressure 102/63, pulse (!) 105, temperature 98 F (36.7 C), temperature source Oral, resp. rate 16, height 5\' 11"  (1.803 m), weight 86.3 kg (190 lb 4.1 oz), SpO2 93 %.  PHYSICAL EXAMINATION:  Physical Exam  GENERAL:  52 y.o.-year-old patient lying in the bed with no acute distress.  EYES: Pupils equal, round, reactive to light and accommodation. No scleral icterus. Extraocular muscles intact.  HEENT: Head atraumatic, normocephalic. Oropharynx and nasopharynx clear.  NECK:  Supple, no jugular venous distention. No thyroid enlargement, no tenderness.  LUNGS: coarse breath sounds bilaterally, with scattered wheezing,no rales,rhonchi or crepitation. No use of accessory muscles of respiration.  CARDIOVASCULAR: S1, S2 normal. No  rubs, or gallops. 2/6 systolic murmur present ABDOMEN: Soft, nontender,  nondistended. Bowel sounds present. No organomegaly or mass.  EXTREMITIES: No pedal edema, cyanosis, or clubbing.  NEUROLOGIC: Cranial nerves II through XII are intact. Decreased visual acuity.  Muscle strength 5/5 in all extremities. Sensation intact. Gait not checked.  PSYCHIATRIC: The patient is alert and oriented x 3.  intermittent confusion noted. Poor judgement SKIN: No obvious rash, lesion, or ulcer.    LABORATORY PANEL:   CBC Recent Labs  Lab 12/07/17 0611  WBC 7.6  HGB 14.1  HCT 40.6  PLT 101*   ------------------------------------------------------------------------------------------------------------------  Chemistries  Recent Labs  Lab 12/06/17 1118  12/09/17 0546  NA 120*   < > 147*  K 4.2   < > 3.0*  CL 81*   < > 114*  CO2 21*   < > 22  GLUCOSE 945*   < > 203*  BUN 73*   < > 47*  CREATININE 6.16*   < > 3.43*  CALCIUM 8.6*   < > 8.7*  MG  --   --  2.2  AST 16  --   --   ALT 13  --   --   ALKPHOS 88  --   --   BILITOT 1.7*  --   --    < > = values in this interval not displayed.   ------------------------------------------------------------------------------------------------------------------  Cardiac Enzymes Recent Labs  Lab 12/06/17 1118  TROPONINI 0.03*   ------------------------------------------------------------------------------------------------------------------  RADIOLOGY:  No results found.  EKG:   Orders placed or performed during the hospital encounter of 12/06/17  . EKG 12-Lead  . EKG 12-Lead  . ED EKG  . ED EKG  . EKG 12-Lead  . EKG 12-Lead    ASSESSMENT AND PLAN:  52 year old male with past medical history significant for schizophrenia, bipolar, hypertension presents to hospitals secondary to altered mental status.  1. Altered mental status-uremic encephalopathy and metabolic encephalopathy -received fluids, improving renal function and sugars -No indication for hemodialysis. -Mental status is improving  2.  Diabetic ketoacidosis-no known history of diabetes. a1c >15 -was on insulin drip- been noncompliant with diet- education given -sugars greater than 1100 on admission - Diabetes coordinator consult - started on lantus  3. Acute renal failure-appreciate nephrology consult. Urine output has improved. Renal ultrasound with no hydronephrosis -creatinine is slowly improving -has underlying CKD, baseline creatinine of 2.6 with GFR of 31.  4. Schizophrenia and bipolar-continue outpatient medications. -Patient on Depakote, Clozaril and perphenazine  5. DVT Prophylaxis- SQ heparin  Independent at baseline, recommend PT consult Likely can be transferred out of ICU today   All the records are reviewed and case discussed with Care Management/Social Workerr. Management plans discussed with the patient, family and they are in agreement.  CODE STATUS: Full code  TOTAL TIME TAKING CARE OF THIS PATIENT: 35 minutes.   POSSIBLE D/C IN 2 DAYS, DEPENDING ON CLINICAL CONDITION.   Gladstone Lighter M.D on 12/09/2017 at 8:59 AM  Between 7am to 6pm - Pager - [phone]  After 6pm go to [email or web address] - password [email or web address] [facility]  Office  [phone]  CC: Primary care physician; Patient, No Pcp Per

## 2017-12-10 LAB — DIFFERENTIAL
BAND NEUTROPHILS: 0 %
BASOS PCT: 1 %
BLASTS: 0 %
Basophils Absolute: 0 10*3/uL (ref 0–0.1)
Eosinophils Absolute: 0 10*3/uL (ref 0–0.7)
Eosinophils Relative: 0 %
LYMPHS PCT: 39 %
Lymphs Abs: 1.2 10*3/uL (ref 1.0–3.6)
METAMYELOCYTES PCT: 0 %
MONOS PCT: 11 %
Monocytes Absolute: 0.4 10*3/uL (ref 0.2–1.0)
Myelocytes: 0 %
NRBC: 0 /100{WBCs}
Neutro Abs: 1.6 10*3/uL (ref 1.4–6.5)
Neutrophils Relative %: 49 %
OTHER: 0 %
Promyelocytes Relative: 0 %

## 2017-12-10 LAB — CBC
HEMATOCRIT: 34.8 % — AB (ref 40.0–52.0)
HEMOGLOBIN: 11.7 g/dL — AB (ref 13.0–18.0)
MCH: 29.7 pg (ref 26.0–34.0)
MCHC: 33.7 g/dL (ref 32.0–36.0)
MCV: 88 fL (ref 80.0–100.0)
Platelets: 70 10*3/uL — ABNORMAL LOW (ref 150–440)
RBC: 3.95 MIL/uL — AB (ref 4.40–5.90)
RDW: 13.4 % (ref 11.5–14.5)
WBC: 3.2 10*3/uL — AB (ref 3.8–10.6)

## 2017-12-10 LAB — BASIC METABOLIC PANEL
ANION GAP: 6 (ref 5–15)
BUN: 37 mg/dL — ABNORMAL HIGH (ref 6–20)
CHLORIDE: 114 mmol/L — AB (ref 98–111)
CO2: 24 mmol/L (ref 22–32)
Calcium: 8.9 mg/dL (ref 8.9–10.3)
Creatinine, Ser: 2.81 mg/dL — ABNORMAL HIGH (ref 0.61–1.24)
GFR calc non Af Amer: 25 mL/min — ABNORMAL LOW (ref >60)
GFR, EST AFRICAN AMERICAN: 29 mL/min — AB (ref >60)
Glucose, Bld: 257 mg/dL — ABNORMAL HIGH (ref 70–99)
POTASSIUM: 4.1 mmol/L (ref 3.5–5.1)
SODIUM: 144 mmol/L (ref 135–145)

## 2017-12-10 LAB — GLUCOSE, CAPILLARY
GLUCOSE-CAPILLARY: 293 mg/dL — AB (ref 70–99)
GLUCOSE-CAPILLARY: 194 mg/dL — AB (ref 70–99)
Glucose-Capillary: 354 mg/dL — ABNORMAL HIGH (ref 70–99)
Glucose-Capillary: 213 mg/dL — ABNORMAL HIGH (ref 70–99)

## 2017-12-10 MED ORDER — INSULIN GLARGINE 100 UNIT/ML ~~LOC~~ SOLN
38.0000 [IU] | Freq: Every day | SUBCUTANEOUS | Status: DC
  Administered 2017-12-10 – 2017-12-11 (×2): 38 [IU] via SUBCUTANEOUS
  Filled 2017-12-10 (×2): qty 0.38

## 2017-12-10 NOTE — Consult Note (Signed)
MEDICATION RELATED CONSULT NOTE - INITIAL   Pharmacy Consult for clozapine lab monitoring and REMS Program reporting. Indication: Schizophrenia   Allergies  Allergen Reactions  . Valproic Acid Rash    hyperammonemia    Patient Measurements: Height: 5\' 11"  (180.3 cm) Weight: 190 lb 4.1 oz (86.3 kg) IBW/kg (Calculated) : 75.3  Vital Signs: Temp: 98 F (36.7 C) (07/14 1418) Temp Source: Oral (07/14 1418) BP: 91/63 (07/14 1418) Pulse Rate: 99 (07/14 1418) Intake/Output from previous day: 07/13 0701 - 07/14 0700 In: 6837 [P.O.:840; I.V.:845] Out: 2200 [Urine:2200] Intake/Output from this shift: Total I/O In: 875 [I.V.:875] Out: 1325 [Urine:1325]  Labs: Recent Labs    12/08/17 0625 12/09/17 0546 12/10/17 0607  WBC  --   --  3.2*  HGB  --   --  11.7*  HCT  --   --  34.8*  PLT  --   --  70*  CREATININE 4.26* 3.43* 2.81*  MG  --  2.2  --   PHOS  --  5.0*  --    Estimated Creatinine Clearance: 33.5 mL/min (A) (by C-G formula based on SCr of 2.81 mg/dL (H)).   Microbiology: Recent Results (from the past 720 hour(s))  MRSA PCR Screening     Status: None   Collection Time: 12/06/17  4:00 PM  Result Value Ref Range Status   MRSA by PCR NEGATIVE NEGATIVE Final    Comment:        The GeneXpert MRSA Assay (FDA approved for NASAL specimens only), is one component of a comprehensive MRSA colonization surveillance program. It is not intended to diagnose MRSA infection nor to guide or monitor treatment for MRSA infections. Performed at [facility], [address] [place], [place] [address]     Medical History: Past Medical History:  Diagnosis Date  . Acute renal failure (ARF) ([place])   . Diabetes mellitus without complication ([place])   . Schizophrenia [facility])     Assessment: Pharmacy consulted for Clozapine lab monitoring and REMS Program reporting in 52 yo M with PMH of schizophrenia. Patient was taking clozapine 200 mg BID PTA.    Plan:  12/06/2017  ANC 6000, lab has been reported to online Clozapine REMS Program. Patient is eligible to receive Clozapine with weekly lab monitoring. Next CBC/Diff 12/13/2017.  7/14: WBC went from 7.6 >> 3.2. ANC now 1600. Can continue with clozapine today since [place] >1500. Will recheck CBC/Diff tomorrow.   Pernell Dupre, PharmD Pharmacy Resident  12/10/2017 3:35 PM

## 2017-12-10 NOTE — Progress Notes (Signed)
[facility]  ROUNDING NOTE   Subjective:  Patient seen at bedside. Urine output was 2.2 L over the preceding 24 hours. Renal function continues to improve.   Objective:  Vital signs in last 24 hours:  Temp:  [97.4 F (36.3 C)-98.6 F (37 C)] 98.6 F (37 C) (07/14 0543) Pulse Rate:  [86-106] 86 (07/14 0543) Resp:  [14-18] 18 (07/14 0543) BP: (114-116)/(76-85) 114/76 (07/14 0543) SpO2:  [100 %] 100 % (07/14 0543)  Weight change:  Filed Weights   12/06/17 1116  Weight: 86.3 kg (190 lb 4.1 oz)    Intake/Output: I/O last 3 completed shifts: In: 2227.5 [P.O.:840; I.V.:1387.5] Out: 2550 [Urine:2550]   Intake/Output this shift:  Total I/O In: 415 [I.V.:415] Out: 600 [Urine:600]  Physical Exam: General: No acute distress  Head: Normocephalic, atraumatic. Moist oral mucosal membranes  Eyes: Anicteric  Neck: Supple, trachea midline  Lungs:  Clear to auscultation, normal effort  Heart: S1S2 no rubs  Abdomen:  Soft, nontender, bowel sounds present  Extremities: No peripheral edema.  Neurologic: Awake, alert, following commands  Skin: No lesions       Basic Metabolic Panel: Recent Labs  Lab 12/07/17 0001 12/07/17 0611 12/08/17 0625 12/08/17 1146 12/09/17 0546 12/09/17 1928 12/10/17 0607  NA 153* 154* 145  --  147*  --  144  K 2.8* 3.2* 3.7  --  3.0* 3.9 4.1  CL 119* 121* 114*  --  114*  --  114*  CO2 23 24 18*  --  22  --  24  GLUCOSE 277* 139* 423* 694* 203*  --  257*  BUN 55* 55* 48*  --  47*  --  37*  CREATININE 4.63* 4.88* 4.26*  --  3.43*  --  2.81*  CALCIUM 9.5 9.2 8.7*  --  8.7*  --  8.9  MG  --   --   --   --  2.2  --   --   PHOS  --   --   --   --  5.0*  --   --     Liver Function Tests: Recent Labs  Lab 12/06/17 1118  AST 16  ALT 13  ALKPHOS 88  BILITOT 1.7*  PROT 7.1  ALBUMIN 3.9   No results for input(s): LIPASE, AMYLASE in the last 168 hours. No results for input(s): AMMONIA in the last 168 hours.  CBC: Recent Labs   Lab 12/06/17 1118 12/07/17 0611 12/10/17 0607  WBC 6.9 7.6 3.2*  NEUTROABS 6.0  --   --   HGB 13.8 14.1 11.7*  HCT 46.7 40.6 34.8*  MCV 101.0* 86.5 88.0  PLT 105* 101* 70*    Cardiac Enzymes: Recent Labs  Lab 12/06/17 1118 12/06/17 1201  CKTOTAL  --  702*  TROPONINI 0.03*  --     BNP: Invalid input(s): POCBNP  CBG: Recent Labs  Lab 12/09/17 1304 12/09/17 1625 12/09/17 2050 12/10/17 0736 12/10/17 1156  GLUCAP 337* 318* 303* 293* 354*    Microbiology: Results for orders placed or performed during the hospital encounter of 12/06/17  MRSA PCR Screening     Status: None   Collection Time: 12/06/17  4:00 PM  Result Value Ref Range Status   MRSA by PCR NEGATIVE NEGATIVE Final    Comment:        The GeneXpert MRSA Assay (FDA approved for NASAL specimens only), is one component of a comprehensive MRSA colonization surveillance program. It is not intended to diagnose MRSA infection nor  to guide or monitor treatment for MRSA infections. Performed at [facility], [address] [place], [place] [address]     Coagulation Studies: No results for input(s): LABPROT, INR in the last 72 hours.  Urinalysis: No results for input(s): COLORURINE, LABSPEC, PHURINE, GLUCOSEU, HGBUR, BILIRUBINUR, KETONESUR, PROTEINUR, UROBILINOGEN, NITRITE, LEUKOCYTESUR in the last 72 hours.  Invalid input(s): APPERANCEUR    Imaging: No results found.   Medications:   . sodium chloride 75 mL/hr at 12/10/17 1018   . clozapine  200 mg Oral BID  . divalproex  500 mg Oral BID  . heparin  5,000 Units Subcutaneous Q8H  . insulin aspart  0-20 Units Subcutaneous TID WC  . insulin aspart  0-5 Units Subcutaneous QHS  . insulin glargine  38 Units Subcutaneous Daily  . insulin starter kit- pen needles  1 kit Other Once  . perphenazine  8 mg Oral BID  . prazosin  2 mg Oral Daily  . temazepam  30 mg Oral QHS  . Vitamin D (Ergocalciferol)  50,000 Units Oral Weekly    acetaminophen **OR** [DISCONTINUED] acetaminophen, [DISCONTINUED] ondansetron **OR** ondansetron (ZOFRAN) IV  Assessment/ Plan:  52 y.o. male  with Schizoaffective disorder hypertension, GERD, prior episode of acute renal failure, diabetes mellitus type 2.    1.  Acute renal failure secondary to severe dehydration 2.  Hypernatremia, Na 144 3.  Acidosis. 4.  Altered mental status, improved significantly. 5.  Hypokalemia, improved, K up to 4.1.  Plan: Renal function continues to be slow to improve.  Creatinine down to 2.81.  Good urine output yesterday 2.2 L.  Continues to have periods of hyperglycemia.  However patient is having some dietary indiscretion.  Insulin management as per hospitalist.  Continue to monitor renal function, serum sodium, and urine output over the course of the hospitalization.   LOS: 4 Takela Varden 7/14/201912:06 PM

## 2017-12-10 NOTE — Progress Notes (Signed)
[place] [facility] [place] NAME: Nicholas Cole    MR#:  [id number]  DATE OF BIRTH:  [birth year]  SUBJECTIVE:  CHIEF COMPLAINT:   Chief Complaint  Patient presents with  . Hyperglycemia   -Noncompliant with diet.  Elevated sugars. -Improving renal function and also improved urine output  REVIEW OF SYSTEMS:  Review of Systems  Constitutional: Negative for chills, fever and malaise/fatigue.  HENT: Negative for hearing loss.   Eyes: Negative for blurred vision and double vision.  Respiratory: Negative for cough, shortness of breath and wheezing.   Cardiovascular: Negative for chest pain and palpitations.  Gastrointestinal: Negative for abdominal pain, constipation, diarrhea, nausea and vomiting.  Genitourinary: Negative for dysuria.  Musculoskeletal: Negative for back pain and myalgias.  Neurological: Negative for dizziness, focal weakness, seizures, weakness and headaches.  Psychiatric/Behavioral: Negative for depression.    DRUG ALLERGIES:   Allergies  Allergen Reactions  . Valproic Acid Rash    hyperammonemia    VITALS:  Blood pressure 114/76, pulse 86, temperature 98.6 F (37 C), temperature source Oral, resp. rate 18, height 5\' 11"  (1.803 m), weight 86.3 kg (190 lb 4.1 oz), SpO2 100 %.  PHYSICAL EXAMINATION:  Physical Exam  GENERAL:  52 y.o.-year-old patient lying in the bed with no acute distress.  EYES: Pupils equal, round, reactive to light and accommodation. No scleral icterus. Extraocular muscles intact.  HEENT: Head atraumatic, normocephalic. Oropharynx and nasopharynx clear.  NECK:  Supple, no jugular venous distention. No thyroid enlargement, no tenderness.  LUNGS: coarse breath sounds bilaterally, with scattered wheezing,no rales,rhonchi or crepitation. No use of accessory muscles of respiration.  CARDIOVASCULAR: S1, S2 normal. No  rubs, or gallops. 2/6 systolic murmur present ABDOMEN: Soft, nontender, nondistended.  Bowel sounds present. No organomegaly or mass.  EXTREMITIES: No pedal edema, cyanosis, or clubbing.  NEUROLOGIC: Cranial nerves II through XII are intact. Decreased visual acuity.  Muscle strength 5/5 in all extremities. Sensation intact. Gait not checked.  PSYCHIATRIC: The patient is alert and oriented x 3.  intermittent confusion noted. Poor judgement.  Some paranoia noted SKIN: No obvious rash, lesion, or ulcer.    LABORATORY PANEL:   CBC Recent Labs  Lab 12/10/17 0607  WBC 3.2*  HGB 11.7*  HCT 34.8*  PLT 70*   ------------------------------------------------------------------------------------------------------------------  Chemistries  Recent Labs  Lab 12/06/17 1118  12/09/17 0546  12/10/17 0607  NA 120*   < > 147*  --  144  K 4.2   < > 3.0*   < > 4.1  CL 81*   < > 114*  --  114*  CO2 21*   < > 22  --  24  GLUCOSE 945*   < > 203*  --  257*  BUN 73*   < > 47*  --  37*  CREATININE 6.16*   < > 3.43*  --  2.81*  CALCIUM 8.6*   < > 8.7*  --  8.9  MG  --   --  2.2  --   --   AST 16  --   --   --   --   ALT 13  --   --   --   --   ALKPHOS 88  --   --   --   --   BILITOT 1.7*  --   --   --   --    < > = values in this interval not displayed.   ------------------------------------------------------------------------------------------------------------------  Cardiac  Enzymes Recent Labs  Lab 12/06/17 1118  TROPONINI 0.03*   ------------------------------------------------------------------------------------------------------------------  RADIOLOGY:  No results found.  EKG:   Orders placed or performed during the hospital encounter of 12/06/17  . EKG 12-Lead  . EKG 12-Lead  . ED EKG  . ED EKG  . EKG 12-Lead  . EKG 12-Lead    ASSESSMENT AND PLAN:   52 year old male with past medical history significant for schizophrenia, bipolar, hypertension presents to hospitals secondary to altered mental status.  1. Altered mental status-uremic encephalopathy and  metabolic encephalopathy -received fluids, improving renal function and sugars -No indication for hemodialysis. -Mental status is improving and at baseline  2. Diabetic ketoacidosis-no known history of diabetes. a1c >15 -off insulin drip- been noncompliant with diet- education given -sugars greater than 1100 on admission - Diabetes coordinator consult - on lantus- adjust dose as needed  3. Acute renal failure-appreciate nephrology consult. Urine output has improved. Renal ultrasound with no hydronephrosis -creatinine is slowly improving -has underlying CKD, baseline creatinine of 2.6 with GFR of 31. -Improved urine output.  4. Schizophrenia and bipolar-continue outpatient medications. -Patient on Depakote, Clozaril and perphenazine  5. DVT Prophylaxis- SQ heparin  Independent at baseline, PT consulted-recommended home health Anticipate discharge tomorrow if sugars are stable   All the records are reviewed and case discussed with Care Management/Social Workerr. Management plans discussed with the patient, family and they are in agreement.  CODE STATUS: Full code  TOTAL TIME TAKING CARE OF THIS PATIENT: 35 minutes.   POSSIBLE D/C IN 1-2 DAYS, DEPENDING ON CLINICAL CONDITION.   Gladstone Lighter M.D on 12/10/2017 at 12:59 PM  Between 7am to 6pm - Pager - [phone]  After 6pm go to [email or web address] - password [email or web address] [facility] [facility]  Office  [phone]  CC: Primary care physician; Patient, No Pcp Per

## 2017-12-10 NOTE — Progress Notes (Signed)
Patient was instructed on how to give himself an insulin injection. He was then allowed to administer dose that had been previously drawn up by RN. Patient was thankful for the instruction. Will advise next shift RN to allow patient to administer insulin as well as check his blood glucose using glucometer.

## 2017-12-10 NOTE — Progress Notes (Addendum)
RNCM attempted consult on patient for home health services. Patient has significant psychiatric history and does not appropriately assist with consult due to behavioral barriers. I have left voicemail with his sister Kalman Shan [phone] in attempts to gain further insight into his situation. Patient is new diabetic and nursing has been educating patient on management of diabetes, but there is not much retention and comprehension. RNCM will continue to follow for discharge needs and attempt to fully complete consult with the sister.    *Update 7/14  1715*   RNCM has still been unsuccessful in reaching patients sister to assist with assessment, discharge planning and new diabetes education. RNCM will continue to follow to ensure proper services are in place at time of discharge.

## 2017-12-11 LAB — BASIC METABOLIC PANEL
ANION GAP: 10 (ref 5–15)
BUN: 33 mg/dL — ABNORMAL HIGH (ref 6–20)
CHLORIDE: 105 mmol/L (ref 98–111)
CO2: 27 mmol/L (ref 22–32)
Calcium: 9.2 mg/dL (ref 8.9–10.3)
Creatinine, Ser: 2.56 mg/dL — ABNORMAL HIGH (ref 0.61–1.24)
GFR calc non Af Amer: 28 mL/min — ABNORMAL LOW (ref >60)
GFR, EST AFRICAN AMERICAN: 32 mL/min — AB (ref >60)
Glucose, Bld: 353 mg/dL — ABNORMAL HIGH (ref 70–99)
POTASSIUM: 4.3 mmol/L (ref 3.5–5.1)
SODIUM: 142 mmol/L (ref 135–145)

## 2017-12-11 LAB — CBC WITH DIFFERENTIAL/PLATELET
Basophils Absolute: 0 10*3/uL (ref 0–0.1)
Basophils Relative: 1 %
EOS ABS: 0 10*3/uL (ref 0–0.7)
Eosinophils Relative: 0 %
HCT: 35.4 % — ABNORMAL LOW (ref 40.0–52.0)
Hemoglobin: 12.2 g/dL — ABNORMAL LOW (ref 13.0–18.0)
LYMPHS ABS: 1.3 10*3/uL (ref 1.0–3.6)
LYMPHS PCT: 35 %
MCH: 30.3 pg (ref 26.0–34.0)
MCHC: 34.4 g/dL (ref 32.0–36.0)
MCV: 88.1 fL (ref 80.0–100.0)
Monocytes Absolute: 0.4 10*3/uL (ref 0.2–1.0)
Monocytes Relative: 10 %
NEUTROS ABS: 1.9 10*3/uL (ref 1.4–6.5)
NEUTROS PCT: 54 %
PLATELETS: 85 10*3/uL — AB (ref 150–440)
RBC: 4.02 MIL/uL — ABNORMAL LOW (ref 4.40–5.90)
RDW: 13 % (ref 11.5–14.5)
WBC: 3.6 10*3/uL — ABNORMAL LOW (ref 3.8–10.6)

## 2017-12-11 LAB — GLUCOSE, CAPILLARY
Glucose-Capillary: 334 mg/dL — ABNORMAL HIGH (ref 70–99)
Glucose-Capillary: 251 mg/dL — ABNORMAL HIGH (ref 70–99)
Glucose-Capillary: 237 mg/dL — ABNORMAL HIGH (ref 70–99)
Glucose-Capillary: 286 mg/dL — ABNORMAL HIGH (ref 70–99)

## 2017-12-11 MED ORDER — METOPROLOL SUCCINATE ER 50 MG PO TB24: 50.0000 mg | ORAL_TABLET | Freq: Every day | ORAL | 2 refills | Status: DC

## 2017-12-11 MED ORDER — INSULIN GLARGINE 100 UNIT/ML ~~LOC~~ SOLN
45.0000 [IU] | Freq: Every day | SUBCUTANEOUS | Status: DC
  Filled 2017-12-11: qty 0.45

## 2017-12-11 MED ORDER — INSULIN GLARGINE 100 UNIT/ML ~~LOC~~ SOLN: 45.0000 [IU] | Freq: Every day | SUBCUTANEOUS | 11 refills | Status: DC

## 2017-12-11 MED ORDER — INSULIN GLARGINE 100 UNIT/ML ~~LOC~~ SOLN
6.0000 [IU] | SUBCUTANEOUS | Status: AC
  Administered 2017-12-11: 15:00:00 6 [IU] via SUBCUTANEOUS
  Filled 2017-12-11: qty 0.06

## 2017-12-11 MED ORDER — INSULIN STARTER KIT- PEN NEEDLES (ENGLISH): 1.0000 | Freq: Once | 0 refills | Status: DC

## 2017-12-11 NOTE — Progress Notes (Signed)
[facility]  ROUNDING NOTE   Subjective:    UOP 2725  Creatinine 2.56 (2.81)  Patient states he is getting used to insulin administration and checking blood glucose.    Objective:  Vital signs in last 24 hours:  Temp:  [97.4 F (36.3 C)-98 F (36.7 C)] 97.4 F (36.3 C) (07/15 0554) Pulse Rate:  [78-99] 92 (07/15 1000) Resp:  [18] 18 (07/15 0554) BP: (91-123)/(60-81) 100/61 (07/15 1000) SpO2:  [95 %-100 %] 99 % (07/15 1000)  Weight change:  Filed Weights   12/06/17 1116  Weight: 86.3 kg (190 lb 4.1 oz)    Intake/Output: I/O last 3 completed shifts: In: 2595 [P.O.:240; I.V.:1475] Out: 6387 [Urine:4475]   Intake/Output this shift:  Total I/O In: 360 [P.O.:360] Out: -   Physical Exam: General: No acute distress  Head: Normocephalic, atraumatic. Moist oral mucosal membranes  Eyes: Anicteric  Neck: Supple, trachea midline  Lungs:  Clear to auscultation, normal effort  Heart: S1S2 no rubs  Abdomen:  Soft, nontender, bowel sounds present  Extremities: No peripheral edema.  Neurologic: Awake, alert, following commands  Skin: No lesions       Basic Metabolic Panel: Recent Labs  Lab 12/07/17 0611 12/08/17 0625 12/08/17 1146 12/09/17 0546 12/09/17 1928 12/10/17 0607 12/11/17 0426  NA 154* 145  --  147*  --  144 142  K 3.2* 3.7  --  3.0* 3.9 4.1 4.3  CL 121* 114*  --  114*  --  114* 105  CO2 24 18*  --  22  --  24 27  GLUCOSE 139* 423* 694* 203*  --  257* 353*  BUN 55* 48*  --  47*  --  37* 33*  CREATININE 4.88* 4.26*  --  3.43*  --  2.81* 2.56*  CALCIUM 9.2 8.7*  --  8.7*  --  8.9 9.2  MG  --   --   --  2.2  --   --   --   PHOS  --   --   --  5.0*  --   --   --     Liver Function Tests: Recent Labs  Lab 12/06/17 1118  AST 16  ALT 13  ALKPHOS 88  BILITOT 1.7*  PROT 7.1  ALBUMIN 3.9   No results for input(s): LIPASE, AMYLASE in the last 168 hours. No results for input(s): AMMONIA in the last 168 hours.  CBC: Recent Labs  Lab  12/06/17 1118 12/07/17 0611 12/10/17 0607 12/11/17 0426  WBC 6.9 7.6 3.2* 3.6*  NEUTROABS 6.0  --  1.6 1.9  HGB 13.8 14.1 11.7* 12.2*  HCT 46.7 40.6 34.8* 35.4*  MCV 101.0* 86.5 88.0 88.1  PLT 105* 101* 70* 85*    Cardiac Enzymes: Recent Labs  Lab 12/06/17 1118 12/06/17 1201  CKTOTAL  --  702*  TROPONINI 0.03*  --     BNP: Invalid input(s): POCBNP  CBG: Recent Labs  Lab 12/10/17 0736 12/10/17 1156 12/10/17 1636 12/10/17 2043 12/11/17 0746  GLUCAP 293* 354* 194* 213* 334*    Microbiology: Results for orders placed or performed during the hospital encounter of 12/06/17  MRSA PCR Screening     Status: None   Collection Time: 12/06/17  4:00 PM  Result Value Ref Range Status   MRSA by PCR NEGATIVE NEGATIVE Final    Comment:        The GeneXpert MRSA Assay (FDA approved for NASAL specimens only), is one component of a comprehensive MRSA colonization  surveillance program. It is not intended to diagnose MRSA infection nor to guide or monitor treatment for MRSA infections. Performed at [facility], [address] [place], [place] [address]     Coagulation Studies: No results for input(s): LABPROT, INR in the last 72 hours.  Urinalysis: No results for input(s): COLORURINE, LABSPEC, PHURINE, GLUCOSEU, HGBUR, BILIRUBINUR, KETONESUR, PROTEINUR, UROBILINOGEN, NITRITE, LEUKOCYTESUR in the last 72 hours.  Invalid input(s): APPERANCEUR    Imaging: No results found.   Medications:    . clozapine  200 mg Oral BID  . divalproex  500 mg Oral BID  . heparin  5,000 Units Subcutaneous Q8H  . insulin aspart  0-20 Units Subcutaneous TID WC  . insulin aspart  0-5 Units Subcutaneous QHS  . insulin glargine  38 Units Subcutaneous Daily  . insulin starter kit- pen needles  1 kit Other Once  . perphenazine  8 mg Oral BID  . prazosin  2 mg Oral Daily  . temazepam  30 mg Oral QHS  . Vitamin D (Ergocalciferol)  50,000 Units Oral Weekly   acetaminophen  **OR** [DISCONTINUED] acetaminophen, [DISCONTINUED] ondansetron **OR** ondansetron (ZOFRAN) IV  Assessment/ Plan:  52 y.o. black male  with Schizoaffective disorder (Followed by [facility]), hypertension, GERD, prior episode of acute renal failure, diabetes mellitus type 2.    1.  Acute renal failure on chronic kidney disease stage III: baseline creatinine of 2.19 GFR of 39. Admitted with hypernatremia, hypokalemia and metabolic acidosis Chronic kidney disease secondary to acute kidney injury with limited recovery. No proteinuria, bland urine Acute renal failure secondary to acute prerenal azotemia - Improved with IV fluids and PO intake.   2. Diabetes mellitus type II with chronic kidney disease: newly diagnosed. Hemoglobin A1c on admission of >15%.     LOS: 5 Sangita Zani 7/15/201911:40 AM

## 2017-12-11 NOTE — Consult Note (Signed)
MEDICATION RELATED CONSULT NOTE - INITIAL   Pharmacy Consult for clozapine lab monitoring and REMS Program reporting. Indication: Schizophrenia   Allergies  Allergen Reactions  . Valproic Acid Rash    hyperammonemia    Patient Measurements: Height: 5\' 11"  (180.3 cm) Weight: 190 lb 4.1 oz (86.3 kg) IBW/kg (Calculated) : 75.3  Vital Signs: Temp: 97.4 F (36.3 C) (07/15 0554) Temp Source: Oral (07/15 0554) BP: 104/70 (07/15 0554) Pulse Rate: 91 (07/15 0554) Intake/Output from previous day: 07/14 0701 - 07/15 0700 In: 1115 [P.O.:240; I.V.:875] Out: 2725 [Urine:2725] Intake/Output from this shift: No intake/output data recorded.  Labs: Recent Labs    12/09/17 0546 12/10/17 0607 12/11/17 0426  WBC  --  3.2* 3.6*  HGB  --  11.7* 12.2*  HCT  --  34.8* 35.4*  PLT  --  70* 85*  CREATININE 3.43* 2.81* 2.56*  MG 2.2  --   --   PHOS 5.0*  --   --    Estimated Creatinine Clearance: 36.8 mL/min (A) (by C-G formula based on SCr of 2.56 mg/dL (H)).   Microbiology: Recent Results (from the past 720 hour(s))  MRSA PCR Screening     Status: None   Collection Time: 12/06/17  4:00 PM  Result Value Ref Range Status   MRSA by PCR NEGATIVE NEGATIVE Final    Comment:        The GeneXpert MRSA Assay (FDA approved for NASAL specimens only), is one component of a comprehensive MRSA colonization surveillance program. It is not intended to diagnose MRSA infection nor to guide or monitor treatment for MRSA infections. Performed at [facility], [address] [place], [place] [address]     Medical History: Past Medical History:  Diagnosis Date  . Acute renal failure (ARF) ([place])   . Diabetes mellitus without complication ([place])   . Schizophrenia [facility])     Assessment: Pharmacy consulted for Clozapine lab monitoring and REMS Program reporting in 52 yo M with PMH of schizophrenia. Patient was taking clozapine 200 mg BID PTA.    Plan:  12/06/2017 ANC 6000, lab has  been reported to online Clozapine REMS Program. Patient is eligible to receive Clozapine with weekly lab monitoring. Next CBC/Diff 12/13/2017.  7/14: WBC went from 7.6 >> 3.2. ANC now 1600. Can continue with clozapine today since [place] >1500. Will recheck CBC/Diff tomorrow.   7/15 AM ANC 1.9. Recheck 7/17.  Eloise Harman, PharmD Pharmacy Resident  12/11/2017 9:39 AM

## 2017-12-11 NOTE — Care Management Important Message (Signed)
Copy of signed IM left with patient in room.  

## 2017-12-11 NOTE — Discharge Summary (Signed)
[place] at [place] NAME: Danie Diehl    MR#:  [id number]  DATE OF BIRTH:  [birth year]  DATE OF ADMISSION:  12/06/2017   ADMITTING PHYSICIAN: Henreitta Leber, MD  DATE OF DISCHARGE:  12/11/17  PRIMARY CARE PHYSICIAN: Patient, No Pcp Per   ADMISSION DIAGNOSIS:   Acute renal failure (ARF) ([place]) [N17.9] Diabetes mellitus with nonketotic hyperosmolarity ([place]) [E11.00]  DISCHARGE DIAGNOSIS:   Active Problems:   DKA (diabetic ketoacidoses) ([place])   SECONDARY DIAGNOSIS:   Past Medical History:  Diagnosis Date  . Acute renal failure (ARF) ([place])   . Diabetes mellitus without complication ([place])   . Schizophrenia [facility])     HOSPITAL COURSE:   52 year old male with past medical history significant for schizophrenia, bipolar, hypertension presents to hospitals secondary to altered mental status.  1. Altered mental status-uremic encephalopathy and metabolic encephalopathy -received fluids, improving renal function and sugars -No indication for hemodialysis. -Mental status is improving and at baseline  2. Diabetic ketoacidosis-no known history of diabetes. a1c >15 -off insulin drip- been noncompliant with diet- education given -With his underlying psychiatric history-very difficult to educate the patient. -sugars greater than 1100 on admission - Diabetes coordinator consult appreciated - on lantus-Lantus at 45 units at discharge.  Endocrinology follow-up within 1 week.  Patient not comfortable giving more than one injection currently.  So we will hold off on NovoLog pre-meal for now  3. Acute renal failure-appreciate nephrology consult. Urine output has improved. Renal ultrasound with no hydronephrosis -creatinine is slowly improving with fluids and better control of sugars -has underlying CKD, baseline creatinine of 2.5 with GFR of 31. -Creatinine close to baseline -Improved urine output.  4. Schizophrenia and bipolar-continue  outpatient medications. -Patient on Depakote, Clozaril and perphenazine -Pharmacy has been following his absolute neutrophil counts to adjust Clozaril dose.  However [place] has remained greater than 1500.  So no further changes.  Continue outpatient monitoring.    Independent at baseline, PT consulted-recommended home health Discharge today    DISCHARGE CONDITIONS:   Guarded  CONSULTS OBTAINED:   Nephrology consult by Dr. Holley Raring  DRUG ALLERGIES:   Allergies  Allergen Reactions  . Valproic Acid Rash    hyperammonemia   DISCHARGE MEDICATIONS:   Allergies as of 12/11/2017      Reactions   Valproic Acid Rash   hyperammonemia      Medication List    STOP taking these medications   amLODipine 5 MG tablet Commonly known as:  NORVASC   chlorthalidone 25 MG tablet Commonly known as:  HYGROTON     TAKE these medications   clozapine 200 MG tablet Commonly known as:  CLOZARIL Take 1 tablet (200 mg total) by mouth 2 (two) times daily.   divalproex 500 MG DR tablet Commonly known as:  DEPAKOTE Take 1 tablet by mouth 2 (two) times daily.   insulin glargine 100 UNIT/ML injection Commonly known as:  LANTUS Inject 0.45 mLs (45 Units total) into the skin daily. Start taking on:  12/12/2017   insulin starter kit- pen needles Misc 1 kit by Other route once for 1 dose.   metoprolol succinate 50 MG 24 hr tablet Commonly known as:  TOPROL-XL Take 1 tablet (50 mg total) by mouth daily. What changed:    medication strength  how much to take   perphenazine 8 MG tablet Commonly known as:  TRILAFON Take 1 tablet (8 mg total) by mouth 2 (two) times daily.   prazosin  2 MG capsule Commonly known as:  MINIPRESS Take 1 capsule by mouth daily.   temazepam 30 MG capsule Commonly known as:  RESTORIL Take 1 capsule (30 mg total) by mouth at bedtime.   Vitamin D (Ergocalciferol) 50000 units Caps capsule Commonly known as:  DRISDOL Take 1 capsule by mouth once a week.         DISCHARGE INSTRUCTIONS:   1.  Endocrinology follow-up in 1 to 2 weeks 2.  Psychiatry follow-up in 1 week  DIET:   Diabetic diet  ACTIVITY:   Activity as tolerated  OXYGEN:   Home Oxygen: No.  Oxygen Delivery: room air  DISCHARGE LOCATION:    home   If you experience worsening of your admission symptoms, develop shortness of breath, life threatening emergency, suicidal or homicidal thoughts you must seek medical attention immediately by calling 911 or calling your MD immediately  if symptoms less severe.  You Must read complete instructions/literature along with all the possible adverse reactions/side effects for all the Medicines you take and that have been prescribed to you. Take any new Medicines after you have completely understood and accpet all the possible adverse reactions/side effects.   Please note  You were cared for by a hospitalist during your hospital stay. If you have any questions about your discharge medications or the care you received while you were in the hospital after you are discharged, you can call the unit and asked to speak with the hospitalist on call if the hospitalist that took care of you is not available. Once you are discharged, your primary care physician will handle any further medical issues. Please note that NO REFILLS for any discharge medications will be authorized once you are discharged, as it is imperative that you return to your primary care physician (or establish a relationship with a primary care physician if you do not have one) for your aftercare needs so that they can reassess your need for medications and monitor your lab values.    On the day of Discharge:  VITAL SIGNS:   Blood pressure 115/76, pulse (!) 108, temperature (!) 97.4 F (36.3 C), temperature source Oral, resp. rate 20, height '5\' 11"'$  (1.803 m), weight 86.3 kg (190 lb 4.1 oz), SpO2 98 %.  PHYSICAL EXAMINATION:   GENERAL:  52 y.o.-year-old patient lying in the  bed with no acute distress.  EYES: Pupils equal, round, reactive to light and accommodation. No scleral icterus. Extraocular muscles intact.  HEENT: Head atraumatic, normocephalic. Oropharynx and nasopharynx clear.  NECK:  Supple, no jugular venous distention. No thyroid enlargement, no tenderness.  LUNGS: improved breath sounds bilaterally, no wheezing,no rales,rhonchi or crepitation. No use of accessory muscles of respiration.  CARDIOVASCULAR: S1, S2 normal. No  rubs, or gallops. 2/6 systolic murmur present ABDOMEN: Soft, nontender, nondistended. Bowel sounds present. No organomegaly or mass.  EXTREMITIES: No pedal edema, cyanosis, or clubbing.  NEUROLOGIC: Cranial nerves II through XII are intact. Decreased visual acuity.  Muscle strength 5/5 in all extremities. Sensation intact. Gait not checked.  PSYCHIATRIC: The patient is alert and oriented x 3.  intermittent confusion noted. Poor judgement.  Some paranoia noted SKIN: No obvious rash, lesion, or ulcer.    DATA REVIEW:   CBC Recent Labs  Lab 12/11/17 0426  WBC 3.6*  HGB 12.2*  HCT 35.4*  PLT 85*    Chemistries  Recent Labs  Lab 12/06/17 1118  12/09/17 0546  12/11/17 0426  NA 120*   < > 147*   < >  142  K 4.2   < > 3.0*   < > 4.3  CL 81*   < > 114*   < > 105  CO2 21*   < > 22   < > 27  GLUCOSE 945*   < > 203*   < > 353*  BUN 73*   < > 47*   < > 33*  CREATININE 6.16*   < > 3.43*   < > 2.56*  CALCIUM 8.6*   < > 8.7*   < > 9.2  MG  --   --  2.2  --   --   AST 16  --   --   --   --   ALT 13  --   --   --   --   ALKPHOS 88  --   --   --   --   BILITOT 1.7*  --   --   --   --    < > = values in this interval not displayed.     Microbiology Results  Results for orders placed or performed during the hospital encounter of 12/06/17  MRSA PCR Screening     Status: None   Collection Time: 12/06/17  4:00 PM  Result Value Ref Range Status   MRSA by PCR NEGATIVE NEGATIVE Final    Comment:        The GeneXpert MRSA Assay  (FDA approved for NASAL specimens only), is one component of a comprehensive MRSA colonization surveillance program. It is not intended to diagnose MRSA infection nor to guide or monitor treatment for MRSA infections. Performed at [facility], [address] [place], [place] [address]     RADIOLOGY:  No results found.   Management plans discussed with the patient, family and they are in agreement.  CODE STATUS:     Code Status Orders  (From admission, onward)        Start     Ordered   12/06/17 1559  Full code  Continuous     12/06/17 1558    Code Status History    Date Active Date Inactive Code Status Order ID Comments User Context   01/27/2017 2206 02/07/2017 1836 Full Code [id number]  Gonzella Lex, MD Inpatient   11/05/2016 2205 11/11/2016 1643 Full Code [id number]  Idelle Crouch, MD Inpatient      TOTAL TIME TAKING CARE OF THIS PATIENT: 38 minutes.    Gladstone Lighter M.D on 12/11/2017 at 1:26 PM  Between 7am to 6pm - Pager - [phone]  After 6pm go to [email or web address] - Proofreader  [facility]  Office  [phone]  CC: Primary care physician; Patient, No Pcp Per   Note: This dictation was prepared with Dragon dictation along with smaller phrase technology. Any transcriptional errors that result from this process are unintentional.

## 2017-12-11 NOTE — Progress Notes (Addendum)
Inpatient Diabetes Program Recommendations  AACE/ADA: New Consensus Statement on Inpatient Glycemic Control (2019)  Target Ranges:  Prepandial:   less than 140 mg/dL      Peak postprandial:   less than 180 mg/dL (1-2 hours)      Critically ill patients:  140 - 180 mg/dL  Results for DEMANI, MCBRIEN (MRN [id number]) as of 12/11/2017 07:22  Ref. Range 12/11/2017 04:26  Glucose Latest Ref Range: 70 - 99 mg/dL 353 (H)   Results for CHANANYA, CANIZALEZ (MRN [id number]) as of 12/11/2017 07:22  Ref. Range 12/10/2017 07:36 12/10/2017 11:56 12/10/2017 16:36 12/10/2017 20:43  Glucose-Capillary Latest Ref Range: 70 - 99 mg/dL 293 (H) 354 (H) 194 (H) 213 (H)   Review of Glycemic Control  Current orders for Inpatient glycemic control: Lantus 38 units daily, Novolog 0-20 units TID with meals, Novolog 0-5 units QHS  Inpatient Diabetes Program Recommendations: Insulin - Basal: Please consider increasing Lantus 43 units daily. Insulin - Meal Coverage: Please consider ordering Novolog 5 units TID with meals for meal coverage if patient eats at least 50% of meals. HgbA1C: A1C > 15.5% on 12/07/17 indicating an average glucose over 398 mg/dl over the past 2-3 months.  Addendum 12/11/17'@13'$ :28- Spoke with patient again regarding new DM dx and insulin.  Patient not able to verbalize any information discussed on Friday (12/11/17) regarding DM or insulin. Patient states he remembers talking to Diabetes Coordinator but "I don't remember good. I forget and my memory isn't good."  Reviewed information regarding DM control and insulin pen. Reviewed contents of insulin flexpen starter kit and reminded patient he has pictures in the insulin administration pamphlet with written instructions along with pictures to show how to use the insulin pen.  Reviewed and demonstrated all steps of insulin pen including attachment of needle, 2-unit air shot, dialing up dose, giving injection, removing needle, disposal of sharps, storage of unused insulin,  disposal of insulin etc. Patient was able to provide return demonstration with several verbal cues. Explained to patient that he likely needs someone to help him with DM managment and insulin administration. Patient states that he lives alone and he can take care of hisself. Patient reports that his sister Vance Gather is not able to help him at all. Patient states he has a friend named Arlan Organ that may be able to help him but then he stated that she probably can't help him because "she is on medicines like me so she can't really help me either." Expressed concern about ability to retain information and safety with insulin administration. Patient stated "I can do it." I just need to get ACT services to bring me my house key so I can go home. Patient reported that ACT services has his house key and he will need it in order to get in his house. Patient reports that he has called ACT services multiple times today but not able to talk with anyone. Talked with Hassan Rowan, RN, CM regarding safety concerns as well.   Thanks, Barnie Alderman, RN, MSN, CDE Diabetes Coordinator Inpatient Diabetes Program [phone] (Team Pager from 8am to 5pm)

## 2017-12-11 NOTE — Discharge Instructions (Signed)

## 2017-12-11 NOTE — Care Management (Signed)
Telephone call to Lanice Schwab [phone]). Left voice mail.  Mr. Santilli indicated that Ms. Delphina Cahill was his case worker at the [employer]. States that Rollene Fare has his keys and he would not be able to get into his apartment. Also, would like to discuss [place] with Mr. Adamec.  Shelbie Ammons RN MSN CCM Care Management [phone]

## 2017-12-11 NOTE — Progress Notes (Signed)
Pt self administered SQ insulin. Demonstrates good technique. States " I can do this".

## 2017-12-11 NOTE — Consult Note (Addendum)
   [facility] CM Inpatient Consult   12/11/2017  Nicholas Cole 1965/10/14 [id number]    [facility] Care Management referral received from DM coordinator for [place] Management program.  Reviewed notes from inpatient RNCM from 12/10/17. Spoke with current inpatient RNCM to make aware of referral and to find out if it was okay to call to speak with patient regarding [place] Management.  Telephone call into patient's room to discuss [place] Management program. Patient was pleasant in conversation but did not answer or consent to [place] Management follow up. Asked if there was someone else Probation officer to call for him to discuss [place] Management, like his sister, he states " no she don't know nothing, she just knows I am in the hospital and I am a diabetic".  Patient also states his Primary Care MD is Dr. Bernita Buffy who does not appear to be a [facility] Provider.  Made inpatient RNCM aware [place] Management services were declined.    Marthenia Rolling, MSN-Ed, RN,BSN [facility] Liaison [phone]

## 2017-12-12 LAB — GLUCOSE, CAPILLARY
GLUCOSE-CAPILLARY: 249 mg/dL — AB (ref 70–99)
GLUCOSE-CAPILLARY: 314 mg/dL — AB (ref 70–99)
Glucose-Capillary: 323 mg/dL — ABNORMAL HIGH (ref 70–99)

## 2017-12-12 MED ORDER — INSULIN STARTER KIT- PEN NEEDLES (ENGLISH): 1.0000 | Freq: Once | 0 refills | Status: AC

## 2017-12-12 MED ORDER — METOPROLOL SUCCINATE ER 50 MG PO TB24: 50.0000 mg | ORAL_TABLET | Freq: Every day | ORAL | 2 refills | Status: DC

## 2017-12-12 MED ORDER — INSULIN GLARGINE 100 UNIT/ML ~~LOC~~ SOLN
52.0000 [IU] | Freq: Every day | SUBCUTANEOUS | Status: DC
  Administered 2017-12-12: 52 [IU] via SUBCUTANEOUS
  Filled 2017-12-12: qty 0.52

## 2017-12-12 MED ORDER — INSULIN GLARGINE 100 UNIT/ML SOLOSTAR PEN: 52.0000 [IU] | PEN_INJECTOR | Freq: Every day | SUBCUTANEOUS | 11 refills | Status: DC

## 2017-12-12 NOTE — Progress Notes (Signed)
Inpatient Diabetes Program Recommendations  AACE/ADA: New Consensus Statement on Inpatient Glycemic Control (2019)  Target Ranges:  Prepandial:   less than 140 mg/dL      Peak postprandial:   less than 180 mg/dL (1-2 hours)      Critically ill patients:  140 - 180 mg/dL  Results for DEONE, LEIFHEIT (MRN [id number]) as of 12/12/2017 07:47  Ref. Range 12/11/2017 07:46 12/11/2017 11:41 12/11/2017 16:26 12/11/2017 21:27 12/12/2017 07:30  Glucose-Capillary Latest Ref Range: 70 - 99 mg/dL 334 (H) 251 (H) 237 (H) 286 (H) 249 (H)   Results for OLANDER, FRIEDL (MRN [id number]) as of 12/12/2017 07:47  Ref. Range 12/07/2017 06:11  Hemoglobin A1C Latest Ref Range: 4.8 - 5.6 % >15.5 (H)   Review of Glycemic Control  Current orders for Inpatient glycemic control: Lantus 45 units daily, Novolog 0-20 units TID with meals, Novolog 0-5 units QHS  Inpatient Diabetes Program Recommendations:  Insulin - Basal: Note patient is only willing to take one insulin injection per day as outpatient. Fasting glucose 249 mg/dl today. Please consider increasing Lantus to 52 units daily (based on 86 kg x 0.6 units).  Thanks, Barnie Alderman, RN, MSN, CDE Diabetes Coordinator Inpatient Diabetes Program [phone] (Team Pager from 8am to 5pm)

## 2017-12-12 NOTE — Care Management (Signed)
Spoke with Lanice Schwab at [facility]. States that [facility] will transport [name] home today,. Will pick up around noon. Please E-scribe medications to [facility] in [place]. No preference as to what home health agency. Will give referral to [place], Floydene Flock, [place] representative updated,  Shelbie Ammons RN MSN CCM Care management [phone]

## 2017-12-12 NOTE — Progress Notes (Signed)
Patient was not discharged yesterday due to social issues.  Will be going home today.  Sugars are better controlled.  Lantus dose being adjusted prior to discharge

## 2017-12-12 NOTE — Care Management (Signed)
Spoke with Technical brewer. States that Nicholas Cole is currently Nicholas Cole. Will have Ms. Dunn call. [phone] [place] MSN CCM Care Management [phone]

## 2017-12-12 NOTE — Progress Notes (Signed)
Per Jeannie Fend we are unable to reach legal guardian ok to discharge she Spoke with Lanice Schwab at [facility]. States that [facility] will transport [name] home today,. Will pick up around noon

## 2017-12-12 NOTE — Progress Notes (Signed)
Received MD order to discharge patient to home.  reviewed home meds, prescriptions,discharge instructions and follow up appointments with patient and rep from group home and both verbalized understanding.

## 2017-12-12 NOTE — Progress Notes (Signed)
[facility]  ROUNDING NOTE   Subjective:    Was not discharged yesterday.   Objective:  Vital signs in last 24 hours:  Temp:  [98 F (36.7 C)-99 F (37.2 C)] 98.3 F (36.8 C) (07/16 1050) Pulse Rate:  [72-106] 106 (07/16 1050) Resp:  [18-19] 18 (07/16 1050) BP: (94-125)/(61-90) 102/82 (07/16 1050) SpO2:  [93 %-100 %] 94 % (07/16 1050)  Weight change:  Filed Weights   12/06/17 1116  Weight: 86.3 kg (190 lb 4.1 oz)    Intake/Output: I/O last 3 completed shifts: In: 1020 [P.O.:1020] Out: 3150 [Urine:3150]   Intake/Output this shift:  Total I/O In: 360 [P.O.:360] Out: 125 [Urine:125]  Physical Exam: General: No acute distress  Head: Normocephalic, atraumatic. Moist oral mucosal membranes  Eyes: Anicteric  Neck: Supple, trachea midline  Lungs:  Clear to auscultation, normal effort  Heart: S1S2 no rubs  Abdomen:  Soft, nontender, bowel sounds present  Extremities: No peripheral edema.  Neurologic: Awake, alert, following commands  Skin: No lesions       Basic Metabolic Panel: Recent Labs  Lab 12/07/17 0611 12/08/17 0625 12/08/17 1146 12/09/17 0546 12/09/17 1928 12/10/17 0607 12/11/17 0426  NA 154* 145  --  147*  --  144 142  K 3.2* 3.7  --  3.0* 3.9 4.1 4.3  CL 121* 114*  --  114*  --  114* 105  CO2 24 18*  --  22  --  24 27  GLUCOSE 139* 423* 694* 203*  --  257* 353*  BUN 55* 48*  --  47*  --  37* 33*  CREATININE 4.88* 4.26*  --  3.43*  --  2.81* 2.56*  CALCIUM 9.2 8.7*  --  8.7*  --  8.9 9.2  MG  --   --   --  2.2  --   --   --   PHOS  --   --   --  5.0*  --   --   --     Liver Function Tests: Recent Labs  Lab 12/06/17 1118  AST 16  ALT 13  ALKPHOS 88  BILITOT 1.7*  PROT 7.1  ALBUMIN 3.9   No results for input(s): LIPASE, AMYLASE in the last 168 hours. No results for input(s): AMMONIA in the last 168 hours.  CBC: Recent Labs  Lab 12/06/17 1118 12/07/17 0611 12/10/17 0607 12/11/17 0426  WBC 6.9 7.6 3.2* 3.6*   NEUTROABS 6.0  --  1.6 1.9  HGB 13.8 14.1 11.7* 12.2*  HCT 46.7 40.6 34.8* 35.4*  MCV 101.0* 86.5 88.0 88.1  PLT 105* 101* 70* 85*    Cardiac Enzymes: Recent Labs  Lab 12/06/17 1118 12/06/17 1201  CKTOTAL  --  702*  TROPONINI 0.03*  --     BNP: Invalid input(s): POCBNP  CBG: Recent Labs  Lab 12/11/17 1626 12/11/17 2127 12/12/17 0730 12/12/17 1053 12/12/17 1147  GLUCAP 237* 286* 249* 314* 323*    Microbiology: Results for orders placed or performed during the hospital encounter of 12/06/17  MRSA PCR Screening     Status: None   Collection Time: 12/06/17  4:00 PM  Result Value Ref Range Status   MRSA by PCR NEGATIVE NEGATIVE Final    Comment:        The GeneXpert MRSA Assay (FDA approved for NASAL specimens only), is one component of a comprehensive MRSA colonization surveillance program. It is not intended to diagnose MRSA infection nor to guide or monitor treatment for MRSA  infections. Performed at [facility], [address] [place], [place] [address]     Coagulation Studies: No results for input(s): LABPROT, INR in the last 72 hours.  Urinalysis: No results for input(s): COLORURINE, LABSPEC, PHURINE, GLUCOSEU, HGBUR, BILIRUBINUR, KETONESUR, PROTEINUR, UROBILINOGEN, NITRITE, LEUKOCYTESUR in the last 72 hours.  Invalid input(s): APPERANCEUR    Imaging: No results found.   Medications:    . clozapine  200 mg Oral BID  . divalproex  500 mg Oral BID  . heparin  5,000 Units Subcutaneous Q8H  . insulin aspart  0-20 Units Subcutaneous TID WC  . insulin aspart  0-5 Units Subcutaneous QHS  . insulin glargine  52 Units Subcutaneous Daily  . insulin starter kit- pen needles  1 kit Other Once  . perphenazine  8 mg Oral BID  . prazosin  2 mg Oral Daily  . temazepam  30 mg Oral QHS  . Vitamin D (Ergocalciferol)  50,000 Units Oral Weekly   acetaminophen **OR** [DISCONTINUED] acetaminophen, [DISCONTINUED] ondansetron **OR** ondansetron  (ZOFRAN) IV  Assessment/ Plan:  52 y.o. black male  with Schizoaffective disorder (Followed by ACT), hypertension, GERD, prior episode of acute renal failure, diabetes mellitus type 2.    1.  Acute renal failure on chronic kidney disease stage III: baseline creatinine of 2.19 GFR of 39. Admitted with hypernatremia, hypokalemia and metabolic acidosis Chronic kidney disease secondary to acute kidney injury with limited recovery. No proteinuria, bland urine Acute renal failure secondary to acute prerenal azotemia and diabetes mellitus type II - Improved with IV fluids and PO intake.   2. Diabetes mellitus type II with chronic kidney disease: newly diagnosed on this admission. Hemoglobin A1c on admission of >15%.     LOS: 6 Nicholas Cole 7/16/201912:55 PM

## 2017-12-12 NOTE — Discharge Summary (Signed)
[facility] [place] NAME: Nicholas Cole    MR#:  [id number]  DATE OF BIRTH:  14-Apr-1966  DATE OF ADMISSION:  12/06/2017   ADMITTING PHYSICIAN: Henreitta Leber, MD  DATE OF DISCHARGE:  12/12/17  PRIMARY CARE PHYSICIAN: Patient, No Pcp Per   ADMISSION DIAGNOSIS:   Acute renal failure (ARF) ([place]) [N17.9] Diabetes mellitus with nonketotic hyperosmolarity ([place]) [E11.00]  DISCHARGE DIAGNOSIS:   Active Problems:   DKA (diabetic ketoacidoses) ([place])   SECONDARY DIAGNOSIS:   Past Medical History:  Diagnosis Date  . Acute renal failure (ARF) ([place])   . Diabetes mellitus without complication ([place])   . Schizophrenia [facility])     HOSPITAL COURSE:   52 year old male with past medical history significant for schizophrenia, bipolar, hypertension presents to hospitals secondary to altered mental status.  1. Altered mental status-uremic encephalopathy and metabolic encephalopathy -received fluids, improving renal function and sugars -No indication for hemodialysis. -Mental status is improved and at baseline  2. Diabetic ketoacidosis-no known history of diabetes. a1c >15 -off insulin drip- been noncompliant with diet- education given -With his underlying psychiatric history-very difficult to educate the patient. -sugars greater than 1100 on admission - Diabetes coordinator consult appreciated - on lantus-adjusted to 52 units.  Given prescription for Solostar pen. -.  Patient not comfortable giving more than one injection currently.  So we will hold off on NovoLog pre-meal for now  3. Acute renal failure-appreciate nephrology consult. Urine output has improved. Renal ultrasound with no hydronephrosis -creatinine is slowly improving with fluids and better control of sugars -has underlying CKD, baseline creatinine of 2.5 with GFR of 31. -Creatinine close to baseline -Improved urine output.  4. Schizophrenia and bipolar-continue  outpatient medications. -Patient on Depakote, Clozaril and perphenazine -Pharmacy has been following his absolute neutrophil counts to adjust Clozaril dose.  However [place] has remained greater than 1500.  So no further changes.  Continue outpatient monitoring.    Independent at baseline, PT consulted-recommended home health Discharge today    DISCHARGE CONDITIONS:   Guarded  CONSULTS OBTAINED:   Nephrology consult by Dr. Holley Raring  DRUG ALLERGIES:   Allergies  Allergen Reactions  . Valproic Acid Rash    hyperammonemia   DISCHARGE MEDICATIONS:   Allergies as of 12/12/2017      Reactions   Valproic Acid Rash   hyperammonemia      Medication List    STOP taking these medications   amLODipine 5 MG tablet Commonly known as:  NORVASC   chlorthalidone 25 MG tablet Commonly known as:  HYGROTON     TAKE these medications   clozapine 200 MG tablet Commonly known as:  CLOZARIL Take 1 tablet (200 mg total) by mouth 2 (two) times daily.   divalproex 500 MG DR tablet Commonly known as:  DEPAKOTE Take 1 tablet by mouth 2 (two) times daily.   Insulin Glargine 100 UNIT/ML Solostar Pen Commonly known as:  LANTUS SOLOSTAR Inject 52 Units into the skin daily.   insulin starter kit- pen needles Misc 1 kit by Other route once for 1 dose.   metoprolol succinate 50 MG 24 hr tablet Commonly known as:  TOPROL-XL Take 1 tablet (50 mg total) by mouth daily. What changed:    medication strength  how much to take   perphenazine 8 MG tablet Commonly known as:  TRILAFON Take 1 tablet (8 mg total) by mouth 2 (two) times daily.   prazosin 2 MG capsule Commonly known as:  MINIPRESS Take 1 capsule by mouth daily.   temazepam 30 MG capsule Commonly known as:  RESTORIL Take 1 capsule (30 mg total) by mouth at bedtime.   Vitamin D (Ergocalciferol) 50000 units Caps capsule Commonly known as:  DRISDOL Take 1 capsule by mouth once a week.        DISCHARGE INSTRUCTIONS:    1.  Endocrinology follow-up in 1 to 2 weeks 2.  Psychiatry follow-up in 1 week  DIET:   Diabetic diet  ACTIVITY:   Activity as tolerated  OXYGEN:   Home Oxygen: No.  Oxygen Delivery: room air  DISCHARGE LOCATION:    home   If you experience worsening of your admission symptoms, develop shortness of breath, life threatening emergency, suicidal or homicidal thoughts you must seek medical attention immediately by calling 911 or calling your MD immediately  if symptoms less severe.  You Must read complete instructions/literature along with all the possible adverse reactions/side effects for all the Medicines you take and that have been prescribed to you. Take any new Medicines after you have completely understood and accpet all the possible adverse reactions/side effects.   Please note  You were cared for by a hospitalist during your hospital stay. If you have any questions about your discharge medications or the care you received while you were in the hospital after you are discharged, you can call the unit and asked to speak with the hospitalist on call if the hospitalist that took care of you is not available. Once you are discharged, your primary care physician will handle any further medical issues. Please note that NO REFILLS for any discharge medications will be authorized once you are discharged, as it is imperative that you return to your primary care physician (or establish a relationship with a primary care physician if you do not have one) for your aftercare needs so that they can reassess your need for medications and monitor your lab values.    On the day of Discharge:  VITAL SIGNS:   Blood pressure 102/82, pulse (!) 106, temperature 98.3 F (36.8 C), temperature source Oral, resp. rate 18, height '5\' 11"'$  (1.803 m), weight 86.3 kg (190 lb 4.1 oz), SpO2 94 %.  PHYSICAL EXAMINATION:   GENERAL:  52 y.o.-year-old patient lying in the bed with no acute distress.  EYES:  Pupils equal, round, reactive to light and accommodation. No scleral icterus. Extraocular muscles intact.  HEENT: Head atraumatic, normocephalic. Oropharynx and nasopharynx clear.  NECK:  Supple, no jugular venous distention. No thyroid enlargement, no tenderness.  LUNGS: improved breath sounds bilaterally, no wheezing,no rales,rhonchi or crepitation. No use of accessory muscles of respiration.  CARDIOVASCULAR: S1, S2 normal. No  rubs, or gallops. 2/6 systolic murmur present ABDOMEN: Soft, nontender, nondistended. Bowel sounds present. No organomegaly or mass.  EXTREMITIES: No pedal edema, cyanosis, or clubbing.  NEUROLOGIC: Cranial nerves II through XII are intact. Decreased visual acuity.  Muscle strength 5/5 in all extremities. Sensation intact. Gait not checked.  PSYCHIATRIC: The patient is alert and oriented x 3.  intermittent confusion noted. Poor judgement.  Some paranoia noted SKIN: No obvious rash, lesion, or ulcer.    DATA REVIEW:   CBC Recent Labs  Lab 12/11/17 0426  WBC 3.6*  HGB 12.2*  HCT 35.4*  PLT 85*    Chemistries  Recent Labs  Lab 12/06/17 1118  12/09/17 0546  12/11/17 0426  NA 120*   < > 147*   < > 142  K 4.2   < >  3.0*   < > 4.3  CL 81*   < > 114*   < > 105  CO2 21*   < > 22   < > 27  GLUCOSE 945*   < > 203*   < > 353*  BUN 73*   < > 47*   < > 33*  CREATININE 6.16*   < > 3.43*   < > 2.56*  CALCIUM 8.6*   < > 8.7*   < > 9.2  MG  --   --  2.2  --   --   AST 16  --   --   --   --   ALT 13  --   --   --   --   ALKPHOS 88  --   --   --   --   BILITOT 1.7*  --   --   --   --    < > = values in this interval not displayed.     Microbiology Results  Results for orders placed or performed during the hospital encounter of 12/06/17  MRSA PCR Screening     Status: None   Collection Time: 12/06/17  4:00 PM  Result Value Ref Range Status   MRSA by PCR NEGATIVE NEGATIVE Final    Comment:        The GeneXpert MRSA Assay (FDA approved for NASAL  specimens only), is one component of a comprehensive MRSA colonization surveillance program. It is not intended to diagnose MRSA infection nor to guide or monitor treatment for MRSA infections. Performed at [facility], [address], [place], [place] [address]     RADIOLOGY:  No results found.   Management plans discussed with the patient, family and they are in agreement.  CODE STATUS:     Code Status Orders  (From admission, onward)        Start     Ordered   12/06/17 1559  Full code  Continuous     12/06/17 1558    Code Status History    Date Active Date Inactive Code Status Order ID Comments User Context   01/27/2017 2206 02/07/2017 1836 Full Code [id number]  Gonzella Lex, MD Inpatient   11/05/2016 2205 11/11/2016 1643 Full Code [id number]  Idelle Crouch, MD Inpatient      TOTAL TIME TAKING CARE OF THIS PATIENT: 38 minutes.    Gladstone Lighter M.D on 12/12/2017 at 1:46 PM  Between 7am to 6pm - Pager - [phone]  After 6pm go to [email or web address] - Proofreader  [facility]  Office  [phone]  CC: Primary care physician; Patient, No Pcp Per   Note: This dictation was prepared with Dragon dictation along with smaller phrase technology. Any transcriptional errors that result from this process are unintentional.

## 2017-12-13 DIAGNOSIS — E118 Type 2 diabetes mellitus with unspecified complications: Secondary | ICD-10-CM | POA: Diagnosis not present

## 2017-12-13 DIAGNOSIS — D649 Anemia, unspecified: Secondary | ICD-10-CM | POA: Diagnosis not present

## 2017-12-13 DIAGNOSIS — N183 Chronic kidney disease, stage 3 (moderate): Secondary | ICD-10-CM | POA: Diagnosis not present

## 2017-12-13 DIAGNOSIS — I959 Hypotension, unspecified: Secondary | ICD-10-CM | POA: Diagnosis not present

## 2017-12-13 DIAGNOSIS — I129 Hypertensive chronic kidney disease with stage 1 through stage 4 chronic kidney disease, or unspecified chronic kidney disease: Secondary | ICD-10-CM | POA: Diagnosis not present

## 2017-12-18 DIAGNOSIS — Z87891 Personal history of nicotine dependence: Secondary | ICD-10-CM | POA: Diagnosis not present

## 2017-12-18 DIAGNOSIS — E1122 Type 2 diabetes mellitus with diabetic chronic kidney disease: Secondary | ICD-10-CM | POA: Diagnosis not present

## 2017-12-18 DIAGNOSIS — K219 Gastro-esophageal reflux disease without esophagitis: Secondary | ICD-10-CM | POA: Diagnosis not present

## 2017-12-18 DIAGNOSIS — Z794 Long term (current) use of insulin: Secondary | ICD-10-CM | POA: Diagnosis not present

## 2017-12-18 DIAGNOSIS — I129 Hypertensive chronic kidney disease with stage 1 through stage 4 chronic kidney disease, or unspecified chronic kidney disease: Secondary | ICD-10-CM | POA: Diagnosis not present

## 2017-12-18 DIAGNOSIS — E1165 Type 2 diabetes mellitus with hyperglycemia: Secondary | ICD-10-CM | POA: Diagnosis not present

## 2017-12-18 DIAGNOSIS — E1100 Type 2 diabetes mellitus with hyperosmolarity without nonketotic hyperglycemic-hyperosmolar coma (NKHHC): Secondary | ICD-10-CM | POA: Diagnosis not present

## 2017-12-18 DIAGNOSIS — N189 Chronic kidney disease, unspecified: Secondary | ICD-10-CM | POA: Diagnosis not present

## 2017-12-26 DIAGNOSIS — I129 Hypertensive chronic kidney disease with stage 1 through stage 4 chronic kidney disease, or unspecified chronic kidney disease: Secondary | ICD-10-CM | POA: Diagnosis not present

## 2017-12-26 DIAGNOSIS — K219 Gastro-esophageal reflux disease without esophagitis: Secondary | ICD-10-CM | POA: Diagnosis not present

## 2017-12-26 DIAGNOSIS — N189 Chronic kidney disease, unspecified: Secondary | ICD-10-CM | POA: Diagnosis not present

## 2017-12-26 DIAGNOSIS — E1100 Type 2 diabetes mellitus with hyperosmolarity without nonketotic hyperglycemic-hyperosmolar coma (NKHHC): Secondary | ICD-10-CM | POA: Diagnosis not present

## 2017-12-26 DIAGNOSIS — Z794 Long term (current) use of insulin: Secondary | ICD-10-CM | POA: Diagnosis not present

## 2017-12-26 DIAGNOSIS — E1122 Type 2 diabetes mellitus with diabetic chronic kidney disease: Secondary | ICD-10-CM | POA: Diagnosis not present

## 2017-12-26 DIAGNOSIS — Z87891 Personal history of nicotine dependence: Secondary | ICD-10-CM | POA: Diagnosis not present

## 2017-12-26 DIAGNOSIS — E1165 Type 2 diabetes mellitus with hyperglycemia: Secondary | ICD-10-CM | POA: Diagnosis not present

## 2017-12-28 DIAGNOSIS — K219 Gastro-esophageal reflux disease without esophagitis: Secondary | ICD-10-CM | POA: Diagnosis not present

## 2017-12-28 DIAGNOSIS — Z87891 Personal history of nicotine dependence: Secondary | ICD-10-CM | POA: Diagnosis not present

## 2017-12-28 DIAGNOSIS — E1100 Type 2 diabetes mellitus with hyperosmolarity without nonketotic hyperglycemic-hyperosmolar coma (NKHHC): Secondary | ICD-10-CM | POA: Diagnosis not present

## 2017-12-28 DIAGNOSIS — E1122 Type 2 diabetes mellitus with diabetic chronic kidney disease: Secondary | ICD-10-CM | POA: Diagnosis not present

## 2017-12-28 DIAGNOSIS — Z794 Long term (current) use of insulin: Secondary | ICD-10-CM | POA: Diagnosis not present

## 2017-12-28 DIAGNOSIS — E1165 Type 2 diabetes mellitus with hyperglycemia: Secondary | ICD-10-CM | POA: Diagnosis not present

## 2017-12-28 DIAGNOSIS — N189 Chronic kidney disease, unspecified: Secondary | ICD-10-CM | POA: Diagnosis not present

## 2017-12-28 DIAGNOSIS — I129 Hypertensive chronic kidney disease with stage 1 through stage 4 chronic kidney disease, or unspecified chronic kidney disease: Secondary | ICD-10-CM | POA: Diagnosis not present

## 2017-12-29 DIAGNOSIS — B351 Tinea unguium: Secondary | ICD-10-CM | POA: Diagnosis not present

## 2017-12-29 DIAGNOSIS — M79674 Pain in right toe(s): Secondary | ICD-10-CM | POA: Diagnosis not present

## 2017-12-29 DIAGNOSIS — M79675 Pain in left toe(s): Secondary | ICD-10-CM | POA: Diagnosis not present

## 2018-01-02 DIAGNOSIS — Z794 Long term (current) use of insulin: Secondary | ICD-10-CM | POA: Diagnosis not present

## 2018-01-02 DIAGNOSIS — E1122 Type 2 diabetes mellitus with diabetic chronic kidney disease: Secondary | ICD-10-CM | POA: Diagnosis not present

## 2018-01-02 DIAGNOSIS — K219 Gastro-esophageal reflux disease without esophagitis: Secondary | ICD-10-CM | POA: Diagnosis not present

## 2018-01-02 DIAGNOSIS — I129 Hypertensive chronic kidney disease with stage 1 through stage 4 chronic kidney disease, or unspecified chronic kidney disease: Secondary | ICD-10-CM | POA: Diagnosis not present

## 2018-01-02 DIAGNOSIS — E1100 Type 2 diabetes mellitus with hyperosmolarity without nonketotic hyperglycemic-hyperosmolar coma (NKHHC): Secondary | ICD-10-CM | POA: Diagnosis not present

## 2018-01-02 DIAGNOSIS — N189 Chronic kidney disease, unspecified: Secondary | ICD-10-CM | POA: Diagnosis not present

## 2018-01-02 DIAGNOSIS — E1165 Type 2 diabetes mellitus with hyperglycemia: Secondary | ICD-10-CM | POA: Diagnosis not present

## 2018-01-02 DIAGNOSIS — Z87891 Personal history of nicotine dependence: Secondary | ICD-10-CM | POA: Diagnosis not present

## 2018-01-04 DIAGNOSIS — N189 Chronic kidney disease, unspecified: Secondary | ICD-10-CM | POA: Diagnosis not present

## 2018-01-04 DIAGNOSIS — E1100 Type 2 diabetes mellitus with hyperosmolarity without nonketotic hyperglycemic-hyperosmolar coma (NKHHC): Secondary | ICD-10-CM | POA: Diagnosis not present

## 2018-01-04 DIAGNOSIS — Z87891 Personal history of nicotine dependence: Secondary | ICD-10-CM | POA: Diagnosis not present

## 2018-01-04 DIAGNOSIS — E1122 Type 2 diabetes mellitus with diabetic chronic kidney disease: Secondary | ICD-10-CM | POA: Diagnosis not present

## 2018-01-04 DIAGNOSIS — K219 Gastro-esophageal reflux disease without esophagitis: Secondary | ICD-10-CM | POA: Diagnosis not present

## 2018-01-04 DIAGNOSIS — E1165 Type 2 diabetes mellitus with hyperglycemia: Secondary | ICD-10-CM | POA: Diagnosis not present

## 2018-01-04 DIAGNOSIS — Z794 Long term (current) use of insulin: Secondary | ICD-10-CM | POA: Diagnosis not present

## 2018-01-04 DIAGNOSIS — I129 Hypertensive chronic kidney disease with stage 1 through stage 4 chronic kidney disease, or unspecified chronic kidney disease: Secondary | ICD-10-CM | POA: Diagnosis not present

## 2018-01-05 ENCOUNTER — Other Ambulatory Visit: Payer: Self-pay

## 2018-01-05 ENCOUNTER — Emergency Department
Admission: EM | Admit: 2018-01-05 | Discharge: 2018-01-06 | Disposition: A | Discharge status: Home / Self Care | Payer: Medicare Other | Attending: Emergency Medicine | Admitting: Emergency Medicine

## 2018-01-05 ENCOUNTER — Emergency Department: Payer: Medicare Other

## 2018-01-05 DIAGNOSIS — E119 Type 2 diabetes mellitus without complications: Secondary | ICD-10-CM | POA: Diagnosis not present

## 2018-01-05 DIAGNOSIS — Z760 Encounter for issue of repeat prescription: Secondary | ICD-10-CM

## 2018-01-05 DIAGNOSIS — I10 Essential (primary) hypertension: Secondary | ICD-10-CM | POA: Diagnosis not present

## 2018-01-05 DIAGNOSIS — Z87891 Personal history of nicotine dependence: Secondary | ICD-10-CM | POA: Insufficient documentation

## 2018-01-05 DIAGNOSIS — R0789 Other chest pain: Secondary | ICD-10-CM | POA: Diagnosis not present

## 2018-01-05 DIAGNOSIS — R079 Chest pain, unspecified: Secondary | ICD-10-CM | POA: Insufficient documentation

## 2018-01-05 LAB — CBC
HCT: 37.2 % — ABNORMAL LOW (ref 40.0–52.0)
Hemoglobin: 12.4 g/dL — ABNORMAL LOW (ref 13.0–18.0)
MCH: 32.1 pg (ref 26.0–34.0)
MCHC: 33.3 g/dL (ref 32.0–36.0)
MCV: 96.3 fL (ref 80.0–100.0)
Platelets: 207 10*3/uL (ref 150–440)
RBC: 3.86 MIL/uL — ABNORMAL LOW (ref 4.40–5.90)
RDW: 17.4 % — AB (ref 11.5–14.5)
WBC: 4.4 10*3/uL (ref 3.8–10.6)

## 2018-01-05 LAB — BASIC METABOLIC PANEL
Anion gap: 11 (ref 5–15)
BUN: 17 mg/dL (ref 6–20)
CALCIUM: 9.5 mg/dL (ref 8.9–10.3)
CO2: 25 mmol/L (ref 22–32)
CREATININE: 2.4 mg/dL — AB (ref 0.61–1.24)
Chloride: 108 mmol/L (ref 98–111)
GFR calc Af Amer: 35 mL/min — ABNORMAL LOW (ref >60)
GFR, EST NON AFRICAN AMERICAN: 30 mL/min — AB (ref >60)
GLUCOSE: 125 mg/dL — AB (ref 70–99)
POTASSIUM: 3.7 mmol/L (ref 3.5–5.1)
SODIUM: 144 mmol/L (ref 135–145)

## 2018-01-05 LAB — TROPONIN I: Troponin I: <0.03 ng/mL (ref <0.03)

## 2018-01-05 MED ORDER — CLOZAPINE 200 MG PO TABS: 200.0000 mg | ORAL_TABLET | Freq: Two times a day (BID) | ORAL | 1 refills | Status: AC

## 2018-01-05 MED ORDER — PRAZOSIN HCL 2 MG PO CAPS: 2.0000 mg | ORAL_CAPSULE | Freq: Every day | ORAL | 1 refills | Status: AC

## 2018-01-05 MED ORDER — DIVALPROEX SODIUM 500 MG PO DR TAB: 500.0000 mg | DELAYED_RELEASE_TABLET | Freq: Two times a day (BID) | ORAL | 1 refills | Status: AC

## 2018-01-05 MED ORDER — INSULIN GLARGINE 100 UNIT/ML SOLOSTAR PEN: 52.0000 [IU] | PEN_INJECTOR | Freq: Every day | SUBCUTANEOUS | 11 refills | Status: DC

## 2018-01-05 MED ORDER — TEMAZEPAM 30 MG PO CAPS: 30.0000 mg | ORAL_CAPSULE | Freq: Every day | ORAL | 0 refills | Status: AC

## 2018-01-05 MED ORDER — METOPROLOL SUCCINATE ER 50 MG PO TB24: 50.0000 mg | ORAL_TABLET | Freq: Every day | ORAL | 2 refills | Status: AC

## 2018-01-05 MED ORDER — PERPHENAZINE 8 MG PO TABS: 8.0000 mg | ORAL_TABLET | Freq: Two times a day (BID) | ORAL | 1 refills | Status: AC

## 2018-01-05 MED ORDER — VITAMIN D (ERGOCALCIFEROL) 1.25 MG (50000 UNIT) PO CAPS: 50000.0000 [IU] | ORAL_CAPSULE | ORAL | 1 refills | Status: AC

## 2018-01-05 NOTE — ED Triage Notes (Signed)
CP to center of chest that started at 1300 today. Mild SOB. Pt alert and oriented X4, active, cooperative, pt in NAD. RR even and unlabored, color WNL.

## 2018-01-05 NOTE — ED Notes (Signed)
This RN spoke with Claiborne Billings, ACT team provider for Mr Ebeling who states pt does not have a legal guardian and is followed by her. She reports she came out today to bring pt his medications and he was not home. States she will come to see him in the morning with his mediations around 11 am. Dr Jimmye Norman informed. Pt to be discharged home and is awaiting a cab.

## 2018-01-05 NOTE — ED Notes (Signed)
EMS pt to lobby , chest pain x3 days , vitals WNL , having thought of harming animals

## 2018-01-05 NOTE — ED Notes (Signed)
Pt states that he is his own legal guardian, no legal guardian name listed.

## 2018-01-05 NOTE — ED Notes (Signed)
Pt reports he has been waiting for 3 days for [facility] to bring his medications. Pt reports CP x 3 days.

## 2018-01-05 NOTE — ED Provider Notes (Addendum)
[facility] Emergency Department Provider Note       Time seen: ----------------------------------------- 7:55 PM on 01/05/2018 -----------------------------------------   I have reviewed the triage vital signs and the nursing notes.  HISTORY   Chief Complaint Chest Pain    HPI Nicholas Cole is a 52 y.o. male with a history of renal insufficiency, diabetes, schizophrenia who presents to the ED for prescription refill.  Patient states been waiting 3 days for a company call [employer] to bring his medications.  Patient reports chest pain for the last 3 days.  Pain started around 1 PM today, he had complained of some mild shortness of breath.  He arrives alert and oriented without any other complaints.  His main concern is his medications today.  Past Medical History:  Diagnosis Date  . Acute renal failure (ARF) ([place])   . Diabetes mellitus without complication ([place])   . Schizophrenia [facility])     Patient Active Problem List   Diagnosis Date Noted  . DKA (diabetic ketoacidoses) ([place]) 12/06/2017  . GERD (gastroesophageal reflux disease) 01/30/2017  . Chronic renal failure 01/27/2017  . Hypertension 01/27/2017  . Lithium toxicity 11/06/2016  . ARF (acute renal failure) ([place]) 11/05/2016  . Dehydration 11/05/2016  . Rhabdomyolysis 11/05/2016  . Schizoaffective disorder, bipolar type ([place]) 11/05/2016    History reviewed. No pertinent surgical history.  Allergies Valproic acid  Social History Social History   Tobacco Use  . Smoking status: Former Smoker    Types: Cigars  . Smokeless tobacco: Never Used  Substance Use Topics  . Alcohol use: No  . Drug use: No   Review of Systems Constitutional: Negative for fever. Cardiovascular: Positive for chest pain Respiratory: Negative for shortness of breath. Gastrointestinal: Negative for abdominal pain, vomiting and diarrhea. Musculoskeletal: Negative for back pain. Skin: Negative for rash. Neurological:  Negative for headaches, focal weakness or numbness.  All systems negative/normal/unremarkable except as stated in the HPI  ____________________________________________   PHYSICAL EXAM:  VITAL SIGNS: ED Triage Vitals [01/05/18 1625]  Enc Vitals Group     BP (!) 146/99     Pulse Rate 79     Resp 16     Temp 98.8 F (37.1 C)     Temp Source Oral     SpO2 100 %     Weight 205 lb (93 kg)     Height 5\' 9"  (1.753 m)     Head Circumference      Peak Flow      Pain Score      Pain Loc      Pain Edu?      Excl. in [place]?    Constitutional: Alert and oriented. Well appearing and in no distress. Eyes: Conjunctivae are normal. Normal extraocular movements. Cardiovascular: Normal rate, regular rhythm. No murmurs, rubs, or gallops. Respiratory: Normal respiratory effort without tachypnea nor retractions. Breath sounds are clear and equal bilaterally. No wheezes/rales/rhonchi. Gastrointestinal: Soft and nontender. Normal bowel sounds Musculoskeletal: Nontender with normal range of motion in extremities. No lower extremity tenderness nor edema. Neurologic:  Normal speech and language. No gross focal neurologic deficits are appreciated.  Skin:  Skin is warm, dry and intact. No rash noted. Psychiatric: Speech and behavior are normal at this time ____________________________________________  EKG: Interpreted by me.  Sinus rhythm the rate 86 bpm, normal PR interval, normal QRS, normal QT  ____________________________________________  ED COURSE:  As part of my medical decision making, I reviewed the following data within the [place]  History obtained from family if available, nursing notes, old chart and ekg, as well as notes from prior ED visits. Patient presented for prescription refill and chest pain, we will assess with labs and imaging as indicated at this time.   Procedures ____________________________________________   LABS (pertinent positives/negatives)  Labs  Reviewed  BASIC METABOLIC PANEL - Abnormal; Notable for the following components:      Result Value   Glucose, Bld 125 (*)    Creatinine, Ser 2.40 (*)    GFR calc non Af Amer 30 (*)    GFR calc Af Amer 35 (*)    All other components within normal limits  CBC - Abnormal; Notable for the following components:   RBC 3.86 (*)    Hemoglobin 12.4 (*)    HCT 37.2 (*)    RDW 17.4 (*)    All other components within normal limits  TROPONIN I    RADIOLOGY  Chest x-ray is unremarkable  ____________________________________________  DIFFERENTIAL DIAGNOSIS   Prescription refill, noncompliance, musculoskeletal pain, anxiety, unstable angina, renal failure  FINAL ASSESSMENT AND PLAN  Medication refill, nonspecific chest pain   Plan: The patient had presented for prescription refill and also with some chest pain. Patient's labs are reassuring and at his baseline. Patient's imaging did not reveal any acute process.  He is cleared for outpatient follow-up, we will provide refills for his medicines.  Laurence Aly, MD   Note: This note was generated in part or whole with voice recognition software. Voice recognition is usually quite accurate but there are transcription errors that can and very often do occur. I apologize for any typographical errors that were not detected and corrected.     Earleen Newport, MD 01/05/18 2015    Earleen Newport, MD 01/05/18 2016

## 2018-01-10 ENCOUNTER — Other Ambulatory Visit: Payer: Self-pay

## 2018-01-10 NOTE — Telephone Encounter (Signed)
Try to call pt no phone no working send faxed to phar pt need tobeen seen for further refills

## 2018-01-17 DIAGNOSIS — D649 Anemia, unspecified: Secondary | ICD-10-CM | POA: Diagnosis not present

## 2018-01-17 DIAGNOSIS — E119 Type 2 diabetes mellitus without complications: Secondary | ICD-10-CM | POA: Diagnosis not present

## 2018-01-17 DIAGNOSIS — E559 Vitamin D deficiency, unspecified: Secondary | ICD-10-CM | POA: Diagnosis not present

## 2018-01-17 DIAGNOSIS — E7800 Pure hypercholesterolemia, unspecified: Secondary | ICD-10-CM | POA: Diagnosis not present

## 2018-01-17 DIAGNOSIS — J449 Chronic obstructive pulmonary disease, unspecified: Secondary | ICD-10-CM | POA: Diagnosis not present

## 2018-01-17 DIAGNOSIS — K219 Gastro-esophageal reflux disease without esophagitis: Secondary | ICD-10-CM | POA: Diagnosis not present

## 2018-03-02 DIAGNOSIS — N2581 Secondary hyperparathyroidism of renal origin: Secondary | ICD-10-CM | POA: Diagnosis not present

## 2018-03-02 DIAGNOSIS — D631 Anemia in chronic kidney disease: Secondary | ICD-10-CM | POA: Diagnosis not present

## 2018-03-02 DIAGNOSIS — I129 Hypertensive chronic kidney disease with stage 1 through stage 4 chronic kidney disease, or unspecified chronic kidney disease: Secondary | ICD-10-CM | POA: Diagnosis not present

## 2018-03-02 DIAGNOSIS — N183 Chronic kidney disease, stage 3 (moderate): Secondary | ICD-10-CM | POA: Diagnosis not present

## 2018-04-05 ENCOUNTER — Telehealth: Payer: Self-pay

## 2018-04-05 NOTE — Telephone Encounter (Signed)
Tried contacting patient since July 2018 to schdule appointment, unable to reach patient multiple times. Nicholas Cole

## 2018-04-12 DIAGNOSIS — M79675 Pain in left toe(s): Secondary | ICD-10-CM | POA: Diagnosis not present

## 2018-04-12 DIAGNOSIS — M79674 Pain in right toe(s): Secondary | ICD-10-CM | POA: Diagnosis not present

## 2018-04-12 DIAGNOSIS — B351 Tinea unguium: Secondary | ICD-10-CM | POA: Diagnosis not present

## 2018-06-04 ENCOUNTER — Inpatient Hospital Stay
Admission: EM | Admit: 2018-06-04 | Discharge: 2018-06-06 | DRG: 682 | Disposition: A | Discharge status: Home Health | Payer: Medicare Other | Attending: Internal Medicine | Admitting: Internal Medicine

## 2018-06-04 ENCOUNTER — Emergency Department: Payer: Medicare Other

## 2018-06-04 ENCOUNTER — Encounter: Payer: Self-pay | Admitting: Emergency Medicine

## 2018-06-04 DIAGNOSIS — E1110 Type 2 diabetes mellitus with ketoacidosis without coma: Secondary | ICD-10-CM | POA: Diagnosis not present

## 2018-06-04 DIAGNOSIS — Z888 Allergy status to other drugs, medicaments and biological substances status: Secondary | ICD-10-CM | POA: Diagnosis not present

## 2018-06-04 DIAGNOSIS — R531 Weakness: Secondary | ICD-10-CM | POA: Diagnosis not present

## 2018-06-04 DIAGNOSIS — R296 Repeated falls: Secondary | ICD-10-CM | POA: Diagnosis present

## 2018-06-04 DIAGNOSIS — Z87891 Personal history of nicotine dependence: Secondary | ICD-10-CM | POA: Diagnosis not present

## 2018-06-04 DIAGNOSIS — K219 Gastro-esophageal reflux disease without esophagitis: Secondary | ICD-10-CM | POA: Diagnosis not present

## 2018-06-04 DIAGNOSIS — F259 Schizoaffective disorder, unspecified: Secondary | ICD-10-CM | POA: Diagnosis present

## 2018-06-04 DIAGNOSIS — E1122 Type 2 diabetes mellitus with diabetic chronic kidney disease: Secondary | ICD-10-CM | POA: Diagnosis present

## 2018-06-04 DIAGNOSIS — I499 Cardiac arrhythmia, unspecified: Secondary | ICD-10-CM | POA: Diagnosis not present

## 2018-06-04 DIAGNOSIS — N19 Unspecified kidney failure: Secondary | ICD-10-CM

## 2018-06-04 DIAGNOSIS — N184 Chronic kidney disease, stage 4 (severe): Secondary | ICD-10-CM | POA: Diagnosis present

## 2018-06-04 DIAGNOSIS — E1310 Other specified diabetes mellitus with ketoacidosis without coma: Secondary | ICD-10-CM

## 2018-06-04 DIAGNOSIS — E860 Dehydration: Secondary | ICD-10-CM | POA: Diagnosis present

## 2018-06-04 DIAGNOSIS — I129 Hypertensive chronic kidney disease with stage 1 through stage 4 chronic kidney disease, or unspecified chronic kidney disease: Secondary | ICD-10-CM | POA: Diagnosis not present

## 2018-06-04 DIAGNOSIS — R1111 Vomiting without nausea: Secondary | ICD-10-CM | POA: Diagnosis not present

## 2018-06-04 DIAGNOSIS — R509 Fever, unspecified: Secondary | ICD-10-CM | POA: Diagnosis not present

## 2018-06-04 DIAGNOSIS — Z79899 Other long term (current) drug therapy: Secondary | ICD-10-CM

## 2018-06-04 DIAGNOSIS — N183 Chronic kidney disease, stage 3 (moderate): Secondary | ICD-10-CM | POA: Diagnosis not present

## 2018-06-04 DIAGNOSIS — N179 Acute kidney failure, unspecified: Principal | ICD-10-CM | POA: Diagnosis present

## 2018-06-04 DIAGNOSIS — K920 Hematemesis: Secondary | ICD-10-CM | POA: Diagnosis not present

## 2018-06-04 DIAGNOSIS — R7989 Other specified abnormal findings of blood chemistry: Secondary | ICD-10-CM | POA: Diagnosis not present

## 2018-06-04 DIAGNOSIS — E876 Hypokalemia: Secondary | ICD-10-CM | POA: Diagnosis present

## 2018-06-04 DIAGNOSIS — S299XXA Unspecified injury of thorax, initial encounter: Secondary | ICD-10-CM | POA: Diagnosis not present

## 2018-06-04 DIAGNOSIS — Z794 Long term (current) use of insulin: Secondary | ICD-10-CM

## 2018-06-04 DIAGNOSIS — E1165 Type 2 diabetes mellitus with hyperglycemia: Secondary | ICD-10-CM | POA: Diagnosis not present

## 2018-06-04 LAB — CBC
HCT: 45.6 % (ref 39.0–52.0)
HEMOGLOBIN: 15.7 g/dL (ref 13.0–17.0)
MCH: 29 pg (ref 26.0–34.0)
MCHC: 34.4 g/dL (ref 30.0–36.0)
MCV: 84.1 fL (ref 80.0–100.0)
Platelets: 198 10*3/uL (ref 150–400)
RBC: 5.42 MIL/uL (ref 4.22–5.81)
RDW: 13.1 % (ref 11.5–15.5)
WBC: 4.3 10*3/uL (ref 4.0–10.5)
nRBC: 0 % (ref 0.0–0.2)

## 2018-06-04 LAB — BLOOD GAS, ARTERIAL
ACID-BASE DEFICIT: 7.7 mmol/L — AB (ref 0.0–2.0)
Bicarbonate: 16.6 mmol/L — ABNORMAL LOW (ref 20.0–28.0)
FIO2: 0.21
O2 Saturation: 97.8 %
Patient temperature: 37
pCO2 arterial: 30 mmHg — ABNORMAL LOW (ref 32.0–48.0)
pH, Arterial: 7.35 (ref 7.350–7.450)
pO2, Arterial: 106 mmHg (ref 83.0–108.0)

## 2018-06-04 LAB — LITHIUM LEVEL: Lithium Lvl: <0.06 mmol/L — ABNORMAL LOW (ref 0.60–1.20)

## 2018-06-04 LAB — URINALYSIS, COMPLETE (UACMP) WITH MICROSCOPIC
BACTERIA UA: NONE SEEN
Bilirubin Urine: NEGATIVE
Glucose, UA: >=500 mg/dL — AB
Ketones, ur: 20 mg/dL — AB
Leukocytes, UA: NEGATIVE
Nitrite: NEGATIVE
Protein, ur: NEGATIVE mg/dL
Specific Gravity, Urine: 1.02 (ref 1.005–1.030)
Squamous Epithelial / HPF: NONE SEEN (ref 0–5)
WBC, UA: NONE SEEN WBC/hpf (ref 0–5)
pH: 5 (ref 5.0–8.0)

## 2018-06-04 LAB — GLUCOSE, CAPILLARY
Glucose-Capillary: >600 mg/dL (ref 70–99)
Glucose-Capillary: 568 mg/dL (ref 70–99)
Glucose-Capillary: 421 mg/dL — ABNORMAL HIGH (ref 70–99)
Glucose-Capillary: 271 mg/dL — ABNORMAL HIGH (ref 70–99)

## 2018-06-04 LAB — TSH: TSH: 0.763 u[IU]/mL (ref 0.350–4.500)

## 2018-06-04 LAB — TROPONIN I: Troponin I: <0.03 ng/mL (ref <0.03)

## 2018-06-04 MED ORDER — DEXTROSE-NACL 5-0.45 % IV SOLN
INTRAVENOUS | Status: DC
  Administered 2018-06-05: 03:00:00 via INTRAVENOUS

## 2018-06-04 MED ORDER — PRAZOSIN HCL 2 MG PO CAPS
2.0000 mg | ORAL_CAPSULE | Freq: Every day | ORAL | Status: DC
  Administered 2018-06-04: 2 mg via ORAL
  Filled 2018-06-04: qty 1
  Filled 2018-06-04 (×2): qty 2

## 2018-06-04 MED ORDER — PERPHENAZINE 4 MG PO TABS
8.0000 mg | ORAL_TABLET | Freq: Two times a day (BID) | ORAL | Status: DC
  Administered 2018-06-05 – 2018-06-06 (×3): 8 mg via ORAL
  Filled 2018-06-04 (×8): qty 2

## 2018-06-04 MED ORDER — VITAMIN D (ERGOCALCIFEROL) 1.25 MG (50000 UNIT) PO CAPS
50000.0000 [IU] | ORAL_CAPSULE | ORAL | Status: DC
  Filled 2018-06-04: qty 1

## 2018-06-04 MED ORDER — SODIUM CHLORIDE 0.9 % IV BOLUS
1000.0000 mL | Freq: Once | INTRAVENOUS | Status: AC
  Administered 2018-06-04: 1000 mL via INTRAVENOUS

## 2018-06-04 MED ORDER — METOPROLOL SUCCINATE ER 50 MG PO TB24
50.0000 mg | ORAL_TABLET | Freq: Every day | ORAL | Status: DC
  Administered 2018-06-04: 50 mg via ORAL
  Filled 2018-06-04 (×3): qty 1

## 2018-06-04 MED ORDER — TEMAZEPAM 30 MG PO CAPS
30.0000 mg | ORAL_CAPSULE | Freq: Every day | ORAL | Status: DC
  Administered 2018-06-05 (×2): 30 mg via ORAL
  Filled 2018-06-04: qty 1
  Filled 2018-06-04: qty 2

## 2018-06-04 MED ORDER — POTASSIUM CHLORIDE 10 MEQ/100ML IV SOLN
10.0000 meq | INTRAVENOUS | Status: AC
  Administered 2018-06-04 – 2018-06-05 (×2): 10 meq via INTRAVENOUS
  Filled 2018-06-04 (×4): qty 100

## 2018-06-04 MED ORDER — CLOZAPINE 100 MG PO TABS
200.0000 mg | ORAL_TABLET | Freq: Two times a day (BID) | ORAL | Status: DC
  Administered 2018-06-05 – 2018-06-06 (×4): 200 mg via ORAL
  Filled 2018-06-04 (×4): qty 2
  Filled 2018-06-04: qty 8

## 2018-06-04 MED ORDER — INSULIN REGULAR(HUMAN) IN NACL 100-0.9 UT/100ML-% IV SOLN
INTRAVENOUS | Status: DC
  Administered 2018-06-04: 5.4 [IU]/h via INTRAVENOUS
  Filled 2018-06-04: qty 100

## 2018-06-04 NOTE — ED Notes (Signed)
Pt gave consent for this RN to update Rollene Fare, [employer].

## 2018-06-04 NOTE — ED Triage Notes (Signed)
Pt in via [facility] from home; reports multiple falls today with generalized weakness.  Pt reports emesis x 2 days ago, denies any at this time.  Pt appears drowsy but remaimns arousable, A/Ox4.  Vitals WDL.

## 2018-06-04 NOTE — ED Notes (Signed)
Updated [employer] on plan. Number [phone]

## 2018-06-04 NOTE — ED Provider Notes (Addendum)
[facility] Emergency Department Provider Note  ____________________________________________   First MD Initiated Contact with Patient 06/04/18 1433     (approximate)  I have reviewed the triage vital signs and the nursing notes.   HISTORY  Chief Complaint Weakness   HPI Nicholas Cole is a 53 y.o. male with a history of acute renal failure, diabetes mellitus as well as schizophrenia who is presenting emergency department today with fall x2.  He says he fell once today as well as 2 days ago.  Says that he has fallen both times while walking and feeling very lightheaded and weak, diffusely.  Says that he is also not been eating or drinking well.  Has his act team which thinks he is "having strokes."  However, patient denies any weakness or numbness.  Denies any pain at this time.  Denies any cough or fever.  Says that he has had his medications changed 2 weeks ago but does not remember which medications were changed.  Says that he did fall and hit the back of his head earlier today.    Past Medical History:  Diagnosis Date  . Acute renal failure (ARF) ([place])   . Diabetes mellitus without complication ([place])   . Schizophrenia [facility])     Patient Active Problem List   Diagnosis Date Noted  . DKA (diabetic ketoacidoses) ([place]) 12/06/2017  . GERD (gastroesophageal reflux disease) 01/30/2017  . Chronic renal failure 01/27/2017  . Hypertension 01/27/2017  . Lithium toxicity 11/06/2016  . ARF (acute renal failure) ([place]) 11/05/2016  . Dehydration 11/05/2016  . Rhabdomyolysis 11/05/2016  . Schizoaffective disorder, bipolar type ([place]) 11/05/2016    History reviewed. No pertinent surgical history.  Prior to Admission medications   Medication Sig Start Date End Date Taking? Authorizing Provider  clozapine (CLOZARIL) 200 MG tablet Take 1 tablet (200 mg total) by mouth 2 (two) times daily. 01/05/18   Earleen Newport, MD  divalproex (DEPAKOTE) 500 MG DR tablet  Take 1 tablet (500 mg total) by mouth 2 (two) times daily. 01/05/18   Earleen Newport, MD  Insulin Glargine (LANTUS SOLOSTAR) 100 UNIT/ML Solostar Pen Inject 52 Units into the skin daily. 01/05/18   Earleen Newport, MD  metoprolol succinate (TOPROL-XL) 50 MG 24 hr tablet Take 1 tablet (50 mg total) by mouth daily. 01/05/18   Earleen Newport, MD  perphenazine (TRILAFON) 8 MG tablet Take 1 tablet (8 mg total) by mouth 2 (two) times daily. 01/05/18   Earleen Newport, MD  prazosin (MINIPRESS) 2 MG capsule Take 1 capsule (2 mg total) by mouth daily. 01/05/18   Earleen Newport, MD  temazepam (RESTORIL) 30 MG capsule Take 1 capsule (30 mg total) by mouth at bedtime. 01/05/18   Earleen Newport, MD  Vitamin D, Ergocalciferol, (DRISDOL) 50000 units CAPS capsule Take 1 capsule (50,000 Units total) by mouth every 7 (seven) days. 01/05/18   Earleen Newport, MD    Allergies Valproic acid  No family history on file.  Social History Social History   Tobacco Use  . Smoking status: Former Smoker    Types: Cigars  . Smokeless tobacco: Never Used  Substance Use Topics  . Alcohol use: No  . Drug use: No    Review of Systems  Constitutional: No fever/chills Eyes: No visual changes. ENT: No sore throat. Cardiovascular: Denies chest pain. Respiratory: Denies shortness of breath. Gastrointestinal: No abdominal pain.  No nausea, no vomiting.  No diarrhea.  No constipation. Genitourinary: Negative  for dysuria. Musculoskeletal: Negative for back pain. Skin: Negative for rash. Neurological: Negative for focal weakness or numbness.   ____________________________________________   PHYSICAL EXAM:  VITAL SIGNS: ED Triage Vitals  Enc Vitals Group     BP 06/04/18 1447 93/70     Pulse Rate 06/04/18 1447 75     Resp 06/04/18 1447 20     Temp 06/04/18 1447 (!) 97.5 F (36.4 C)     Temp Source 06/04/18 1447 Oral     SpO2 06/04/18 1447 100 %     Weight 06/04/18 1448 200 lb (90.7  kg)     Height 06/04/18 1448 5\' 9"  (1.753 m)     Head Circumference --      Peak Flow --      Pain Score 06/04/18 1448 0     Pain Loc --      Pain Edu? --      Excl. in [place]? --     Constitutional: Alert and oriented. Well appearing and in no acute distress. Eyes: Conjunctivae are normal.  Head: Atraumatic. Nose: No congestion/rhinnorhea. Mouth/Throat: Mucous membranes are moist.  Neck: No stridor.   Cardiovascular: Normal rate, regular rhythm. Grossly normal heart sounds.   Respiratory: Normal respiratory effort.  No retractions. Lungs CTAB. Gastrointestinal: Soft and nontender. No distention.  Musculoskeletal: No lower extremity tenderness nor edema.  No joint effusions. Neurologic:  Normal speech and language. No gross focal neurologic deficits are appreciated. Skin:  Skin is warm, dry and intact. No rash noted. Psychiatric: Mood and affect are normal. Speech and behavior are normal.  ____________________________________________   LABS (all labs ordered are listed, but only abnormal results are displayed)  Labs Reviewed  URINALYSIS, COMPLETE (UACMP) WITH MICROSCOPIC - Abnormal; Notable for the following components:      Result Value   Color, Urine STRAW (*)    APPearance CLEAR (*)    Glucose, UA >=500 (*)    Hgb urine dipstick SMALL (*)    Ketones, ur 20 (*)    All other components within normal limits  BASIC METABOLIC PANEL - Abnormal; Notable for the following components:   Sodium 126 (*)    Potassium 3.3 (*)    Chloride 83 (*)    CO2 19 (*)    Glucose, Bld 640 (*)    BUN 90 (*)    Creatinine, Ser 4.30 (*)    GFR calc non Af Amer 15 (*)    GFR calc Af Amer 17 (*)    Anion gap 24 (*)    All other components within normal limits  BLOOD GAS, VENOUS - Abnormal; Notable for the following components:   Acid-base deficit 5.0 (*)    All other components within normal limits  GLUCOSE, CAPILLARY - Abnormal; Notable for the following components:   Glucose-Capillary >600  (*)    All other components within normal limits  BLOOD GAS, ARTERIAL - Abnormal; Notable for the following components:   pCO2 arterial 30 (*)    Bicarbonate 16.6 (*)    Acid-base deficit 7.7 (*)    All other components within normal limits  GLUCOSE, CAPILLARY - Abnormal; Notable for the following components:   Glucose-Capillary 568 (*)    All other components within normal limits  CBC  TROPONIN I  TSH  URINALYSIS, ROUTINE W REFLEX MICROSCOPIC  LITHIUM LEVEL  CBG MONITORING, ED   ____________________________________________  EKG  ED ECG REPORT I, Doran Stabler, the attending physician, personally viewed and interpreted this ECG.  Date: 06/04/2018  EKG Time: 1511  Rate: 74  Rhythm: normal sinus rhythm  Axis: Normal  Intervals:none  ST&T Change: Mild ST elevation diffusely without reciprocal T wave inversion or depression.  ____________________________________________  RADIOLOGY  Negative CT head noncontrast.  No active acute cardiopulmonary disease. ____________________________________________   PROCEDURES  Procedure(s) performed:   .Critical Care Performed by: Orbie Pyo, MD Authorized by: Orbie Pyo, MD   Critical care provider statement:    Critical care time (minutes):  35   Critical care time was exclusive of:  Separately billable procedures and treating other patients   Critical care was necessary to treat or prevent imminent or life-threatening deterioration of the following conditions:  Metabolic crisis   Critical care was time spent personally by me on the following activities:  Development of treatment plan with patient or surrogate, discussions with consultants, evaluation of patient's response to treatment, examination of patient, obtaining history from patient or surrogate, ordering and performing treatments and interventions, ordering and review of laboratory studies, ordering and review of radiographic studies, pulse  oximetry, re-evaluation of patient's condition and review of old charts    Critical Care performed:   ____________________________________________   INITIAL IMPRESSION / ASSESSMENT AND PLAN / ED COURSE  Pertinent labs & imaging results that were available during my care of the patient were reviewed by me and considered in my medical decision making (see chart for details).  DDX: Renal failure, dehydration, malnutrition, failure to thrive, near syncope, intracranial hemorrhage, pneumonia, hypothyroidism As part of my medical decision making, I reviewed the following data within the [place] Notes from prior ED visits  Differential diagnosis includes, but is not limited to, alcohol, illicit or prescription medications, or other toxic ingestion; intracranial pathology such as stroke or intracerebral hemorrhage; fever or infectious causes including sepsis; hypoxemia and/or hypercarbia; uremia; trauma; endocrine related disorders such as diabetes, hypoglycemia, and thyroid-related diseases; hypertensive encephalopathy; etc. As part of my medical decision making, I reviewed the following data within the electronic MEDICAL RECORD NUMBER Notes from prior ED visits  ----------------------------------------- 7:13 PM on 06/04/2018 -----------------------------------------  Patient requiring insulin drip at this time.  We will also restart his home medications.  Will need ICU level of admission because of DKA.  He is aware of the diagnosis well treatment and willing to comply.  Signed out to Dr. Vianne Bulls.  ----------------------------------------- 9:02 PM on 06/04/2018 -----------------------------------------  Unfortunately, we were informed that there were no ICU beds available here at [facility].  We also tried [facility] and found that they had no capacity either.  However, the patient has been accepted to [facility] under the service of Dr. Candiss Norse and will be going to  the stepdown unit there.  Patient informed is understanding and willing to comply. ____________________________________________   FINAL CLINICAL IMPRESSION(S) / ED DIAGNOSES  DKA.  Weakness.  Acute renal injury.  Uremia.  NEW MEDICATIONS STARTED DURING THIS VISIT:  New Prescriptions   No medications on file     Note:  This document was prepared using Dragon voice recognition software and may include unintentional dictation errors.     Orbie Pyo, MD 06/04/18 Lanetta Inch    Orbie Pyo, MD 06/04/18 2102

## 2018-06-04 NOTE — ED Notes (Signed)
Patient accepted to [facility], waiting for bed assignment

## 2018-06-04 NOTE — ED Notes (Signed)
Called [facility] for transfer  2004

## 2018-06-04 NOTE — ED Notes (Signed)
Respiratory notified of [facility].

## 2018-06-05 ENCOUNTER — Encounter: Payer: Self-pay | Admitting: Internal Medicine

## 2018-06-05 DIAGNOSIS — Z794 Long term (current) use of insulin: Secondary | ICD-10-CM | POA: Diagnosis not present

## 2018-06-05 DIAGNOSIS — N179 Acute kidney failure, unspecified: Secondary | ICD-10-CM | POA: Diagnosis present

## 2018-06-05 DIAGNOSIS — K219 Gastro-esophageal reflux disease without esophagitis: Secondary | ICD-10-CM | POA: Diagnosis present

## 2018-06-05 DIAGNOSIS — E1110 Type 2 diabetes mellitus with ketoacidosis without coma: Secondary | ICD-10-CM | POA: Diagnosis present

## 2018-06-05 DIAGNOSIS — I129 Hypertensive chronic kidney disease with stage 1 through stage 4 chronic kidney disease, or unspecified chronic kidney disease: Secondary | ICD-10-CM | POA: Diagnosis present

## 2018-06-05 DIAGNOSIS — F259 Schizoaffective disorder, unspecified: Secondary | ICD-10-CM | POA: Diagnosis present

## 2018-06-05 DIAGNOSIS — E1122 Type 2 diabetes mellitus with diabetic chronic kidney disease: Secondary | ICD-10-CM | POA: Diagnosis present

## 2018-06-05 DIAGNOSIS — Z87891 Personal history of nicotine dependence: Secondary | ICD-10-CM | POA: Diagnosis not present

## 2018-06-05 DIAGNOSIS — Z79899 Other long term (current) drug therapy: Secondary | ICD-10-CM | POA: Diagnosis not present

## 2018-06-05 DIAGNOSIS — E876 Hypokalemia: Secondary | ICD-10-CM | POA: Diagnosis present

## 2018-06-05 DIAGNOSIS — E860 Dehydration: Secondary | ICD-10-CM | POA: Diagnosis present

## 2018-06-05 DIAGNOSIS — N184 Chronic kidney disease, stage 4 (severe): Secondary | ICD-10-CM | POA: Diagnosis present

## 2018-06-05 DIAGNOSIS — Z888 Allergy status to other drugs, medicaments and biological substances status: Secondary | ICD-10-CM | POA: Diagnosis not present

## 2018-06-05 DIAGNOSIS — R296 Repeated falls: Secondary | ICD-10-CM | POA: Diagnosis present

## 2018-06-05 LAB — COMPREHENSIVE METABOLIC PANEL
ALK PHOS: 59 U/L (ref 38–126)
ALT: 9 U/L (ref 0–44)
AST: 12 U/L — ABNORMAL LOW (ref 15–41)
Albumin: 2.9 g/dL — ABNORMAL LOW (ref 3.5–5.0)
Anion gap: 9 (ref 5–15)
BUN: 58 mg/dL — AB (ref 6–20)
CO2: 24 mmol/L (ref 22–32)
Calcium: 8.4 mg/dL — ABNORMAL LOW (ref 8.9–10.3)
Chloride: 100 mmol/L (ref 98–111)
Creatinine, Ser: 2.6 mg/dL — ABNORMAL HIGH (ref 0.61–1.24)
GFR calc Af Amer: 32 mL/min — ABNORMAL LOW (ref >60)
GFR, EST NON AFRICAN AMERICAN: 27 mL/min — AB (ref >60)
Glucose, Bld: 275 mg/dL — ABNORMAL HIGH (ref 70–99)
Potassium: 3.2 mmol/L — ABNORMAL LOW (ref 3.5–5.1)
Sodium: 133 mmol/L — ABNORMAL LOW (ref 135–145)
Total Bilirubin: 1.1 mg/dL (ref 0.3–1.2)
Total Protein: 5.4 g/dL — ABNORMAL LOW (ref 6.5–8.1)

## 2018-06-05 LAB — BASIC METABOLIC PANEL
ANION GAP: 24 — AB (ref 5–15)
Anion gap: 13 (ref 5–15)
BUN: 90 mg/dL — ABNORMAL HIGH (ref 6–20)
BUN: 71 mg/dL — AB (ref 6–20)
CO2: 19 mmol/L — ABNORMAL LOW (ref 22–32)
CO2: 23 mmol/L (ref 22–32)
Calcium: 9.5 mg/dL (ref 8.9–10.3)
Calcium: 8.6 mg/dL — ABNORMAL LOW (ref 8.9–10.3)
Chloride: 83 mmol/L — ABNORMAL LOW (ref 98–111)
Chloride: 99 mmol/L (ref 98–111)
Creatinine, Ser: 4.3 mg/dL — ABNORMAL HIGH (ref 0.61–1.24)
Creatinine, Ser: 3.16 mg/dL — ABNORMAL HIGH (ref 0.61–1.24)
GFR calc Af Amer: 17 mL/min — ABNORMAL LOW (ref >60)
GFR calc Af Amer: 25 mL/min — ABNORMAL LOW (ref >60)
GFR calc non Af Amer: 15 mL/min — ABNORMAL LOW (ref >60)
GFR calc non Af Amer: 22 mL/min — ABNORMAL LOW (ref >60)
Glucose, Bld: 640 mg/dL (ref 70–99)
Glucose, Bld: 125 mg/dL — ABNORMAL HIGH (ref 70–99)
Potassium: 3.3 mmol/L — ABNORMAL LOW (ref 3.5–5.1)
Potassium: 2.7 mmol/L — CL (ref 3.5–5.1)
Sodium: 126 mmol/L — ABNORMAL LOW (ref 135–145)
Sodium: 135 mmol/L (ref 135–145)

## 2018-06-05 LAB — CBC WITH DIFFERENTIAL/PLATELET
Abs Immature Granulocytes: 0.02 10*3/uL (ref 0.00–0.07)
BASOS PCT: 0 %
Basophils Absolute: 0 10*3/uL (ref 0.0–0.1)
Eosinophils Absolute: 0.1 10*3/uL (ref 0.0–0.5)
Eosinophils Relative: 2 %
HCT: 42.1 % (ref 39.0–52.0)
Hemoglobin: 14.2 g/dL (ref 13.0–17.0)
Immature Granulocytes: 0 %
Lymphocytes Relative: 45 %
Lymphs Abs: 2.1 10*3/uL (ref 0.7–4.0)
MCH: 28.6 pg (ref 26.0–34.0)
MCHC: 33.7 g/dL (ref 30.0–36.0)
MCV: 84.9 fL (ref 80.0–100.0)
MONO ABS: 0.7 10*3/uL (ref 0.1–1.0)
Monocytes Relative: 16 %
Neutro Abs: 1.7 10*3/uL (ref 1.7–7.7)
Neutrophils Relative %: 37 %
Platelets: 165 10*3/uL (ref 150–400)
RBC: 4.96 MIL/uL (ref 4.22–5.81)
RDW: 12.8 % (ref 11.5–15.5)
WBC: 4.6 10*3/uL (ref 4.0–10.5)
nRBC: 0 % (ref 0.0–0.2)

## 2018-06-05 LAB — PHOSPHORUS: Phosphorus: 2 mg/dL — ABNORMAL LOW (ref 2.5–4.6)

## 2018-06-05 LAB — PROTEIN, URINE, RANDOM: Total Protein, Urine: 13 mg/dL

## 2018-06-05 LAB — GLUCOSE, CAPILLARY
GLUCOSE-CAPILLARY: 200 mg/dL — AB (ref 70–99)
Glucose-Capillary: 128 mg/dL — ABNORMAL HIGH (ref 70–99)
Glucose-Capillary: 213 mg/dL — ABNORMAL HIGH (ref 70–99)
Glucose-Capillary: 252 mg/dL — ABNORMAL HIGH (ref 70–99)
Glucose-Capillary: 298 mg/dL — ABNORMAL HIGH (ref 70–99)
Glucose-Capillary: 330 mg/dL — ABNORMAL HIGH (ref 70–99)
Glucose-Capillary: 300 mg/dL — ABNORMAL HIGH (ref 70–99)
Glucose-Capillary: 367 mg/dL — ABNORMAL HIGH (ref 70–99)
Glucose-Capillary: 255 mg/dL — ABNORMAL HIGH (ref 70–99)

## 2018-06-05 LAB — NA AND K (SODIUM & POTASSIUM), RAND UR
Potassium Urine: 9 mmol/L
Sodium, Ur: 60 mmol/L

## 2018-06-05 LAB — PREALBUMIN: Prealbumin: 23 mg/dL (ref 18–38)

## 2018-06-05 LAB — MAGNESIUM: Magnesium: 2.2 mg/dL (ref 1.7–2.4)

## 2018-06-05 LAB — CREATININE, URINE, RANDOM: Creatinine, Urine: 52 mg/dL

## 2018-06-05 MED ORDER — ONDANSETRON HCL 4 MG PO TABS: 4.0000 mg | ORAL_TABLET | Freq: Four times a day (QID) | ORAL | Status: DC | PRN

## 2018-06-05 MED ORDER — BISACODYL 5 MG PO TBEC
5.0000 mg | DELAYED_RELEASE_TABLET | Freq: Every day | ORAL | Status: DC | PRN
  Filled 2018-06-05: qty 1

## 2018-06-05 MED ORDER — HEPARIN SODIUM (PORCINE) 5000 UNIT/ML IJ SOLN
5000.0000 [IU] | Freq: Three times a day (TID) | INTRAMUSCULAR | Status: DC
  Administered 2018-06-05 – 2018-06-06 (×4): 5000 [IU] via SUBCUTANEOUS
  Filled 2018-06-05 (×4): qty 1

## 2018-06-05 MED ORDER — INSULIN ASPART 100 UNIT/ML IV SOLN: 6.0000 [IU] | Freq: Three times a day (TID) | INTRAVENOUS | Status: DC

## 2018-06-05 MED ORDER — INSULIN GLARGINE 100 UNIT/ML ~~LOC~~ SOLN: 10.0000 [IU] | Freq: Every day | SUBCUTANEOUS | Status: DC

## 2018-06-05 MED ORDER — POTASSIUM CHLORIDE 10 MEQ/100ML IV SOLN
10.0000 meq | INTRAVENOUS | Status: DC
  Filled 2018-06-05 (×4): qty 100

## 2018-06-05 MED ORDER — INSULIN ASPART 100 UNIT/ML ~~LOC~~ SOLN
6.0000 [IU] | Freq: Three times a day (TID) | SUBCUTANEOUS | Status: DC
  Administered 2018-06-05 – 2018-06-06 (×3): 6 [IU] via SUBCUTANEOUS
  Filled 2018-06-05 (×3): qty 1

## 2018-06-05 MED ORDER — DIVALPROEX SODIUM 500 MG PO DR TAB
500.0000 mg | DELAYED_RELEASE_TABLET | Freq: Two times a day (BID) | ORAL | Status: DC
  Administered 2018-06-05 – 2018-06-06 (×3): 500 mg via ORAL
  Filled 2018-06-05 (×4): qty 1

## 2018-06-05 MED ORDER — ACETAMINOPHEN 325 MG PO TABS: 650.0000 mg | ORAL_TABLET | Freq: Four times a day (QID) | ORAL | Status: DC | PRN

## 2018-06-05 MED ORDER — ONDANSETRON HCL 4 MG/2ML IJ SOLN: 4.0000 mg | Freq: Four times a day (QID) | INTRAMUSCULAR | Status: DC | PRN

## 2018-06-05 MED ORDER — POTASSIUM CHLORIDE CRYS ER 20 MEQ PO TBCR
60.0000 meq | EXTENDED_RELEASE_TABLET | Freq: Once | ORAL | Status: AC
  Administered 2018-06-05: 60 meq via ORAL
  Filled 2018-06-05: qty 3

## 2018-06-05 MED ORDER — POTASSIUM CHLORIDE CRYS ER 20 MEQ PO TBCR
20.0000 meq | EXTENDED_RELEASE_TABLET | Freq: Two times a day (BID) | ORAL | Status: DC
  Administered 2018-06-05 – 2018-06-06 (×3): 20 meq via ORAL
  Filled 2018-06-05 (×3): qty 1

## 2018-06-05 MED ORDER — INSULIN ASPART 100 UNIT/ML ~~LOC~~ SOLN
0.0000 [IU] | Freq: Every day | SUBCUTANEOUS | Status: DC
  Administered 2018-06-05: 3 [IU] via SUBCUTANEOUS
  Filled 2018-06-05: qty 1

## 2018-06-05 MED ORDER — POTASSIUM CHLORIDE 10 MEQ/100ML IV SOLN
10.0000 meq | INTRAVENOUS | Status: DC
  Administered 2018-06-05: 10 meq via INTRAVENOUS
  Filled 2018-06-05 (×4): qty 100

## 2018-06-05 MED ORDER — SENNOSIDES-DOCUSATE SODIUM 8.6-50 MG PO TABS: 1.0000 | ORAL_TABLET | Freq: Every evening | ORAL | Status: DC | PRN

## 2018-06-05 MED ORDER — INSULIN GLARGINE 100 UNIT/ML ~~LOC~~ SOLN
25.0000 [IU] | Freq: Two times a day (BID) | SUBCUTANEOUS | Status: DC
  Administered 2018-06-05 (×2): 25 [IU] via SUBCUTANEOUS
  Filled 2018-06-05 (×5): qty 0.25

## 2018-06-05 MED ORDER — INSULIN GLARGINE 100 UNIT/ML ~~LOC~~ SOLN
10.0000 [IU] | Freq: Once | SUBCUTANEOUS | Status: AC
  Administered 2018-06-05: 10 [IU] via SUBCUTANEOUS
  Filled 2018-06-05: qty 0.1

## 2018-06-05 MED ORDER — INSULIN ASPART 100 UNIT/ML ~~LOC~~ SOLN
0.0000 [IU] | Freq: Three times a day (TID) | SUBCUTANEOUS | Status: DC
  Administered 2018-06-05: 11 [IU] via SUBCUTANEOUS
  Administered 2018-06-05: 20 [IU] via SUBCUTANEOUS
  Administered 2018-06-06: 11 [IU] via SUBCUTANEOUS
  Filled 2018-06-05 (×4): qty 1

## 2018-06-05 MED ORDER — ACETAMINOPHEN 650 MG RE SUPP: 650.0000 mg | Freq: Four times a day (QID) | RECTAL | Status: DC | PRN

## 2018-06-05 NOTE — ED Notes (Signed)
Educated pt on importance of compliance and why it's important for him to keep his arm a certain way in order for him to receive his IV medication and pt refuses to cooperate.

## 2018-06-05 NOTE — ED Notes (Signed)
Patient saturated stretcher in urine. Urinal on side rail. When asked why patient did not use the urinal patient states "I dont know". Patient asked if he has urinal incontience at home. Patient states "sometimes". Linen and gown changed at this time. Brief placed on patient.

## 2018-06-05 NOTE — ED Notes (Signed)
Pt has taken monitor off to eat.

## 2018-06-05 NOTE — Progress Notes (Signed)
Clozapine monitoring note:  Patient ordeedr Clozapine.  Patient refusing labs to be drawn for [place] check.  Rescheduled labs to the afternoon 06/05/2018.   F/u- if patient refusing labs then clozapine use in this patient may need to be reevaluated  Chinita Greenland PharmD Clinical Pharmacist 06/05/2018

## 2018-06-05 NOTE — H&P (Signed)
[facility] NAME: Nicholas Cole    MR#:  [id number]  DATE OF BIRTH:  11-Aug-1965  DATE OF ADMISSION:  06/04/2018  PRIMARY CARE PHYSICIAN: Patient, No Pcp Per   REQUESTING/REFERRING PHYSICIAN: Paulette Blanch, MD  CHIEF COMPLAINT:   Chief Complaint  Patient presents with  . Weakness    HISTORY OF PRESENT ILLNESS:  Nicholas Cole  is a 53 y.o. male with a known history of IDDM p/w AGAP metabolic acidosis 2/2 diabetes. Poor historian; I suspect he may have some sort of intellectual disability. Hx/ROS very limited. Reportedly presented w/ generalized weakness and falls; this is not what he tells me. Endorses polyphagia and hunger, followed by N/V and PO intolerance. He wants to eat but has been unable to keep food down. He denies abdominal pain. When asked if he has diarrhea, he produces a vague response. He denies urinary symptoms. Admission glucose 640, AGAP 24. Glucose improved, gap closed on insulin gtt in ED, given 10u Lantus. Admitted for continued mgmt.   PAST MEDICAL HISTORY:   Past Medical History:  Diagnosis Date  . Acute renal failure (ARF) ([place])   . Diabetes mellitus without complication ([place])   . Schizophrenia ([place])     PAST SURGICAL HISTORY:  History reviewed. No pertinent surgical history.  SOCIAL HISTORY:   Social History   Tobacco Use  . Smoking status: Former Smoker    Types: Cigars  . Smokeless tobacco: Never Used  Substance Use Topics  . Alcohol use: No    FAMILY HISTORY:  History reviewed. No pertinent family history.  DRUG ALLERGIES:   Allergies  Allergen Reactions  . Valproic Acid Rash and Other (See Comments)    Hyperammonemia    REVIEW OF SYSTEMS:   Review of Systems  Unable to perform ROS: Medical condition  Constitutional: Negative for fever.  HENT: Negative for hearing loss.   Eyes: Negative for blurred vision, double vision and photophobia.  Respiratory: Negative for cough and  shortness of breath.   Cardiovascular: Negative for chest pain.  Gastrointestinal: Positive for nausea and vomiting. Negative for abdominal pain.  Genitourinary: Negative for dysuria.  Musculoskeletal: Positive for falls.  Skin: Negative for itching and rash.  Neurological: Positive for weakness.  Psychiatric/Behavioral: Negative for memory loss. The patient does not have insomnia.    Possible intellectual disability, poor historian. MEDICATIONS AT HOME:   Prior to Admission medications   Medication Sig Start Date End Date Taking? Authorizing Provider  clozapine (CLOZARIL) 200 MG tablet Take 1 tablet (200 mg total) by mouth 2 (two) times daily. 01/05/18  Yes Earleen Newport, MD  divalproex (DEPAKOTE) 500 MG DR tablet Take 1 tablet (500 mg total) by mouth 2 (two) times daily. 01/05/18  Yes Earleen Newport, MD  Insulin Glargine (LANTUS SOLOSTAR) 100 UNIT/ML Solostar Pen Inject 52 Units into the skin daily. 01/05/18  Yes Earleen Newport, MD  metoprolol succinate (TOPROL-XL) 50 MG 24 hr tablet Take 1 tablet (50 mg total) by mouth daily. 01/05/18  Yes Earleen Newport, MD  perphenazine (TRILAFON) 8 MG tablet Take 1 tablet (8 mg total) by mouth 2 (two) times daily. 01/05/18  Yes Earleen Newport, MD  prazosin (MINIPRESS) 2 MG capsule Take 1 capsule (2 mg total) by mouth daily. 01/05/18  Yes Earleen Newport, MD  temazepam (RESTORIL) 30 MG capsule Take 1 capsule (30 mg total) by mouth at bedtime. 01/05/18  Yes Earleen Newport, MD  Vitamin D, Ergocalciferol, (DRISDOL) 50000 units CAPS capsule Take 1 capsule (50,000 Units total) by mouth every 7 (seven) days. 01/05/18  Yes Earleen Newport, MD      VITAL SIGNS:  Blood pressure 99/63, pulse 72, temperature (!) 97.5 F (36.4 C), temperature source Oral, resp. rate 18, height 5\' 9"  (1.753 m), weight 90.7 kg, SpO2 96 %.  PHYSICAL EXAMINATION:  Physical Exam Constitutional:      General: He is not in acute distress.     Appearance: He is normal weight. He is ill-appearing. He is not toxic-appearing or diaphoretic.  HENT:     Head: Atraumatic.  Eyes:     General: No scleral icterus.    Extraocular Movements: Extraocular movements intact.     Conjunctiva/sclera: Conjunctivae normal.  Neck:     Musculoskeletal: Neck supple.  Cardiovascular:     Rate and Rhythm: Normal rate and regular rhythm.     Heart sounds: Normal heart sounds. No murmur. No friction rub. No gallop.   Pulmonary:     Effort: No respiratory distress.     Breath sounds: Normal breath sounds. No stridor. No wheezing, rhonchi or rales.  Abdominal:     General: There is no distension.     Palpations: Abdomen is soft.     Tenderness: There is no abdominal tenderness. There is no guarding or rebound.  Musculoskeletal: Normal range of motion.        General: No swelling or tenderness.     Right lower leg: No edema.     Left lower leg: No edema.  Lymphadenopathy:     Cervical: No cervical adenopathy.  Skin:    General: Skin is warm and dry.     Findings: No erythema or rash.  Neurological:     Mental Status: He is alert.     Comments: Possible intellectual disability.  Psychiatric:        Mood and Affect: Mood normal.    LABORATORY PANEL:   CBC Recent Labs  Lab 06/04/18 1514  WBC 4.3  HGB 15.7  HCT 45.6  PLT 198   ------------------------------------------------------------------------------------------------------------------  Chemistries  Recent Labs  Lab 06/05/18 0246  NA 135  K 2.7*  CL 99  CO2 23  GLUCOSE 125*  BUN 71*  CREATININE 3.16*  CALCIUM 8.6*   ------------------------------------------------------------------------------------------------------------------  Cardiac Enzymes Recent Labs  Lab 06/04/18 1600  TROPONINI <0.03   ------------------------------------------------------------------------------------------------------------------  RADIOLOGY:  Dg Chest 2 View  Result Date:  06/04/2018 CLINICAL DATA:  Fall EXAM: CHEST - 2 VIEW COMPARISON:  01/05/2018 chest radiograph. FINDINGS: Stable cardiomediastinal silhouette with normal heart size. No pneumothorax. No pleural effusion. Lungs appear clear, with no acute consolidative airspace disease and no pulmonary edema. No displaced fractures in the visualized chest. IMPRESSION: No active cardiopulmonary disease. Electronically Signed   By: Ilona Sorrel M.D.   On: 06/04/2018 16:14   Ct Head Wo Contrast  Result Date: 06/04/2018 CLINICAL DATA:  Multiple falls today.  Generalized weakness. EXAM: CT HEAD WITHOUT CONTRAST TECHNIQUE: Contiguous axial images were obtained from the base of the skull through the vertex without intravenous contrast. COMPARISON:  12/06/2017; 11/05/2016 FINDINGS: Brain: Gray-white differentiation is maintained. No CT evidence of acute large territory infarct. No intraparenchymal or extra-axial mass or hemorrhage. Normal size and configuration of the ventricles and the basilar cisterns. No midline shift. Vascular: No hyperdense vessel or unexpected calcification. Skull: No displaced calvarial fracture. Sinuses/Orbits: There is underpneumatization of the bilateral frontal sinuses. The remaining paranasal sinuses and mastoid air  cells are normally aerated. No air-fluid levels. Debris is seen within the bilateral external auditory canals. Other: Regional soft tissues appear normal. No radiopaque foreign body. IMPRESSION: Negative noncontrast head CT. Electronically Signed   By: Sandi Mariscal M.D.   On: 06/04/2018 15:55   IMPRESSION AND PLAN:   A/P: 64M w/ PMHx IDDM p/w AGAP metabolic acidosis 2/2 diabetes. Hypokalemia, hyperglycemia/glycosuria, CKD IV, hypocalcemia. -Uncontrolled diabetes, AGAP metabolic acidosis, hypokalemia, hyperglycemia, glycosuria: AGAP closed w/ insulin gtt in ED. Received Lantus. D/C insulin gtt. SSI, Lantus BID. IVF. Replete K+, monitor. Mag level pending. -CKD IV: Cr at baseline. Urine studies.  Monitor BMP, avoid nephrotoxins. -Hypocalcemia: Ionized calcium. -c/w home meds/formulary subs. -FEN/GI: Cardiac diabetic diet. -DVT PPx: Heparin. -Code status: Full code. -Disposition: Admission, > 2 midnights.   All the records are reviewed and case discussed with ED provider. Management plans discussed with the patient, family and they are in agreement.  CODE STATUS: Full code.  TOTAL TIME TAKING CARE OF THIS PATIENT: 75 minutes.    Arta Silence M.D on 06/05/2018 at 10:29 AM  Between 7am to 6pm - Pager - [phone]  After 6pm go to [email or web address] - Proofreader  [facility] [address] [facility]  Office  [phone]  CC: Primary care physician; Patient, No Pcp Per   Note: This dictation was prepared with Dragon dictation along with smaller phrase technology. Any transcriptional errors that result from this process are unintentional.

## 2018-06-05 NOTE — Progress Notes (Signed)
Patient admitted this morning in DKA.  DKA has resolved.  Patient also has acute on chronic kidney disease which is improving with IV fluids.  Repeat labs for tomorrow.  Continue management as per diabetes nurse.  I will check A1c.

## 2018-06-05 NOTE — ED Notes (Signed)
Patient refusing to be stuck for blood drawl at this time. Unable to collect prealbumin due at 0657 and CBC with diff due at 1030. MD notified and aware.

## 2018-06-05 NOTE — ED Notes (Signed)
Entered room to find a large puddle of urine on the floor and patient gown and pants soaked and laying beside the stretcher. Pt states that he couldn't hold his urine anymore and nobody came in time to help him. Pt cleaned and placed in fresh gown. Urinal placed on bedside and pt educated on its use.

## 2018-06-05 NOTE — Progress Notes (Signed)
Inpatient Diabetes Program Recommendations  AACE/ADA: New Consensus Statement on Inpatient Glycemic Control (2015)  Target Ranges:  Prepandial:   less than 140 mg/dL      Peak postprandial:   less than 180 mg/dL (1-2 hours)      Critically ill patients:  140 - 180 mg/dL   Lab Results  Component Value Date   GLUCAP 300 (H) 06/05/2018   HGBA1C >15.5 (H) 12/07/2017    Spoke with patient at bedside regarding DM and management.  He states "I'm new to this".  We discussed that he was diagnoses in July of 2019.  I asked patient if he took his insulin and he states "not in the past 2 days b/c I fell".  He states that he has been "falling" a lot.  We briefly discussed the symptoms of low blood sugars "silly, sweaty, dizzy".  He states I have these and I just "lay down".  Discussed that if blood sugar is less than 70 mg/dL, he should drink juice or Regular soda.  Patient states "oh".  Reinforced teaching, however patient was not able to teach back.  I asked if he had insulin at home and he states, "oh yes, 7 boxes".  He states that he is good at taking insulin.  I was unable to follow patient for some of the conversation.  Needs close supervision for DM care however I am unsure that this is available?  Per RN, ACT team called asking if patient can have orders for "home health" to assist with DM management as well.  Awaiting results of A1C and will follow blood sugars. Very difficult to assess patient's knowledge and proficiency in managing his DM.  Safety is top priority at this time.    Thanks,  Adah Perl, RN, BC-ADM Inpatient Diabetes Coordinator Pager [phone] (8a-5p)

## 2018-06-05 NOTE — Progress Notes (Addendum)
Inpatient Diabetes Program Recommendations  AACE/ADA: New Consensus Statement on Inpatient Glycemic Control (2015)  Target Ranges:  Prepandial:   less than 140 mg/dL      Peak postprandial:   less than 180 mg/dL (1-2 hours)      Critically ill patients:  140 - 180 mg/dL   Lab Results  Component Value Date   GLUCAP 330 (H) 06/05/2018   HGBA1C >15.5 (H) 12/07/2017    Review of Glycemic Control Results for Nicholas Cole, Nicholas Cole (MRN [id number]) as of 06/05/2018 10:18  Ref. Range 06/05/2018 03:55 06/05/2018 07:14 06/05/2018 08:18 06/05/2018 09:43  Glucose-Capillary Latest Ref Range: 70 - 99 mg/dL 213 (H) 252 (H) 298 (H) 330 (H)   Diabetes history: DM2 Outpatient Diabetes medications: Lantus 52 units daily Current orders for Inpatient glycemic control:  IV insulin and transitioning to Lantus 25 units bid and Novolog resistant tid with meals and HS Inpatient Diabetes Program Recommendations:   Agree with current orders.  Please add A1C to current labs.  Also consider adding Novolog meal coverage 6 units tid with meals (hold if patient eats less than 50%).  Spoke to RN and patient has been transitioned off insulin drip.  Per chart review he was diagnoses with DM in July of 2019.  At that time, he was seen several times by Diabetes Coordinator and patient was only able to give one shot a day.  Will follow while in the hospital.   Thanks,  Adah Perl, RN, BC-ADM Inpatient Diabetes Coordinator Pager [phone] (8a-5p)

## 2018-06-05 NOTE — ED Notes (Signed)
Spoke with dr mody about patient. Anion gap has closed and CO2 WNL per last lab test. Ok to give lantus now and then stop drops for glucosestabilizer in 1 hr per protocol.  Pt will then continue with sliding scale. Held 8 am sliding scale per dr mody as pt still on insulin gtt.

## 2018-06-05 NOTE — Progress Notes (Signed)
[facility], [place] 06/05/18  Subjective:   Patient known to our practice from previous admissions and outpatient follow-up.  This time he presents via EMS reporting multiple falls and generalized weakness.  He also reported some nausea and vomiting.  Presenting blood sugar was 648, creatinine of 4.30.  With IV hydration and potassium replacement, creatinine has improved to 2.60, potassium of 3.2. This morning he was able to eat breakfast without nausea or vomiting.   Objective:  Vital signs in last 24 hours:  Temp:  [97.5 F (36.4 C)] 97.5 F (36.4 C) (01/06 1447) Pulse Rate:  [69-87] 72 (01/07 0906) Resp:  [10-20] 18 (01/07 0906) BP: (93-132)/(60-119) 99/63 (01/07 0906) SpO2:  [94 %-100 %] 96 % (01/07 0906) Weight:  [90.7 kg] 90.7 kg (01/06 1448)  Weight change:  Filed Weights   06/04/18 1448  Weight: 90.7 kg    Intake/Output:    Intake/Output Summary (Last 24 hours) at 06/05/2018 1045 Last data filed at 06/05/2018 0949 Gross per 24 hour  Intake 2945.89 ml  Output 600 ml  Net 2345.89 ml     Physical Exam: General: Laying in bed, NAD  HEENT Anicteric, moist oral mucus membranes  Neck supple  Pulm/lungs Clear to ausculatation b/l  CVS/Heart Regular, n rub  Abdomen:  Soft, obese, NT  Extremities: No edema  Neurologic: Alert, oriented  Skin: No acute rashes    Basic Metabolic Panel:  Recent Labs  Lab 06/04/18 1600 06/05/18 0246  NA 126* 135  K 3.3* 2.7*  CL 83* 99  CO2 19* 23  GLUCOSE 640* 125*  BUN 90* 71*  CREATININE 4.30* 3.16*  CALCIUM 9.5 8.6*     CBC: Recent Labs  Lab 06/04/18 1514  WBC 4.3  HGB 15.7  HCT 45.6  MCV 84.1  PLT 198      Lab Results  Component Value Date   HEPBSAG Negative 11/06/2016      Microbiology:  No results found for this or any previous visit (from the past 240 hour(s)).  Coagulation Studies: No results for input(s): LABPROT, INR in the last 72 hours.  Urinalysis: Recent Labs     06/04/18 1514  COLORURINE STRAW*  LABSPEC 1.020  PHURINE 5.0  GLUCOSEU >=500*  HGBUR SMALL*  BILIRUBINUR NEGATIVE  KETONESUR 20*  PROTEINUR NEGATIVE  NITRITE NEGATIVE  LEUKOCYTESUR NEGATIVE      Imaging: Dg Chest 2 View  Result Date: 06/04/2018 CLINICAL DATA:  Fall EXAM: CHEST - 2 VIEW COMPARISON:  01/05/2018 chest radiograph. FINDINGS: Stable cardiomediastinal silhouette with normal heart size. No pneumothorax. No pleural effusion. Lungs appear clear, with no acute consolidative airspace disease and no pulmonary edema. No displaced fractures in the visualized chest. IMPRESSION: No active cardiopulmonary disease. Electronically Signed   By: Ilona Sorrel M.D.   On: 06/04/2018 16:14   Ct Head Wo Contrast  Result Date: 06/04/2018 CLINICAL DATA:  Multiple falls today.  Generalized weakness. EXAM: CT HEAD WITHOUT CONTRAST TECHNIQUE: Contiguous axial images were obtained from the base of the skull through the vertex without intravenous contrast. COMPARISON:  12/06/2017; 11/05/2016 FINDINGS: Brain: Gray-white differentiation is maintained. No CT evidence of acute large territory infarct. No intraparenchymal or extra-axial mass or hemorrhage. Normal size and configuration of the ventricles and the basilar cisterns. No midline shift. Vascular: No hyperdense vessel or unexpected calcification. Skull: No displaced calvarial fracture. Sinuses/Orbits: There is underpneumatization of the bilateral frontal sinuses. The remaining paranasal sinuses and mastoid air cells are normally aerated. No air-fluid levels. Debris  is seen within the bilateral external auditory canals. Other: Regional soft tissues appear normal. No radiopaque foreign body. IMPRESSION: Negative noncontrast head CT. Electronically Signed   By: Sandi Mariscal M.D.   On: 06/04/2018 15:55     Medications:   . dextrose 5 % and 0.45% NaCl Stopped (06/05/18 0949)  . insulin Stopped (06/05/18 0949)  . potassium chloride 10 mEq (06/05/18 1009)    . clozapine  200 mg Oral BID  . divalproex  500 mg Oral BID  . heparin  5,000 Units Subcutaneous Q8H  . insulin aspart  0-20 Units Subcutaneous TID WC  . insulin aspart  0-5 Units Subcutaneous QHS  . insulin glargine  25 Units Subcutaneous BID  . metoprolol succinate  50 mg Oral Daily  . perphenazine  8 mg Oral BID  . prazosin  2 mg Oral Daily  . temazepam  30 mg Oral QHS   acetaminophen **OR** acetaminophen, bisacodyl, ondansetron **OR** ondansetron (ZOFRAN) IV, senna-docusate  Assessment/ Plan:  53 y.o.African American male with Schizoaffective disorder (Followed by ACT), hypertension, GERD, prior episode of acute renal failure, diabetes mellitus type 2, insulin dependent    1.  Acute kidney injury, likely secondary to dehydration from hyperglycemia We will continue IV fluid replacement 2.  Hypokalemia Normal magnesium level this morning.  Change replacement to oral potassium 3.  Chronic kidney disease st 3.  Baseline creatinine 2.40/GFR 35 from January 05, 2018 4.  Diabetes type 2, insulin-dependent with CKD.  Poorly controlled Recommend diabetes education and dietary education while inpatient Repeat A1c in AM   LOS: 0 Ghada Abbett Candiss Norse 1/7/202010:45 AM  [place], [place]  Note: This note was prepared with Dragon dictation. Any transcription errors are unintentional

## 2018-06-05 NOTE — Progress Notes (Signed)
Pharmacy consulted to monitor Clozapine. Per Clozapine REMS site patient is eligible for dispensing at weekly monitoring.  06/05/18  ANC  1700  Next ANC due on 06/12/18  Vallery Sa, PharmD Clinical Pharmacist

## 2018-06-05 NOTE — ED Notes (Signed)
Pt requested food from admitting provider. Dr Jodell Cipro reviewed labs and approved diabetic/heart healthy diet. Nutrition called and tray requested.

## 2018-06-05 NOTE — ED Notes (Signed)
Called [facility] to cancel transfer to [place] per dr. sung

## 2018-06-05 NOTE — ED Notes (Signed)
Called [facility] to cancel transfer to [place] per dr. sung

## 2018-06-05 NOTE — ED Provider Notes (Signed)
-----------------------------------------   3:40 AM on 06/05/2018 -----------------------------------------  Repeated metabolic panel.  Patient has closed his anion gap.  IV potassium infusing.  Will discuss with hospitalist Dr. Rozann Lesches to evaluate patient in the emergency department for admission at our facility.   Paulette Blanch, MD 06/05/18 [phone]

## 2018-06-05 NOTE — ED Notes (Signed)
Patient sitting on side of bed eating lunch at this time.

## 2018-06-05 NOTE — ED Notes (Signed)
Pt up, walking around room, has removed all monitors and brief.  Pt bed is wet and states that his gown is wet as well.  CBG checked and dinner tray provided.  Tech changed bed and provided pt with clean gown.  Monitor on standby at this time.  Will watch pt closely.

## 2018-06-06 LAB — HEMOGLOBIN A1C
Hgb A1c MFr Bld: 16.3 % — ABNORMAL HIGH (ref 4.8–5.6)
Mean Plasma Glucose: 421.11 mg/dL

## 2018-06-06 LAB — BASIC METABOLIC PANEL
Anion gap: 11 (ref 5–15)
BUN: 49 mg/dL — AB (ref 6–20)
CO2: 23 mmol/L (ref 22–32)
CREATININE: 2.43 mg/dL — AB (ref 0.61–1.24)
Calcium: 9.4 mg/dL (ref 8.9–10.3)
Chloride: 105 mmol/L (ref 98–111)
GFR calc Af Amer: 34 mL/min — ABNORMAL LOW (ref >60)
GFR calc non Af Amer: 30 mL/min — ABNORMAL LOW (ref >60)
Glucose, Bld: 111 mg/dL — ABNORMAL HIGH (ref 70–99)
Potassium: 3 mmol/L — ABNORMAL LOW (ref 3.5–5.1)
Sodium: 139 mmol/L (ref 135–145)

## 2018-06-06 LAB — BLOOD GAS, VENOUS
Acid-base deficit: 5 mmol/L — ABNORMAL HIGH (ref 0.0–2.0)
Bicarbonate: 21.6 mmol/L (ref 20.0–28.0)
O2 Saturation: 34.5 %
Patient temperature: 37
pCO2, Ven: 45 mmHg (ref 44.0–60.0)
pH, Ven: 7.29 (ref 7.250–7.430)

## 2018-06-06 LAB — GLUCOSE, CAPILLARY
Glucose-Capillary: 150 mg/dL — ABNORMAL HIGH (ref 70–99)
Glucose-Capillary: 292 mg/dL — ABNORMAL HIGH (ref 70–99)

## 2018-06-06 LAB — UREA NITROGEN, URINE: Urea Nitrogen, Ur: 526 mg/dL

## 2018-06-06 LAB — CALCIUM, IONIZED: Calcium, Ionized, Serum: 4.4 mg/dL — ABNORMAL LOW (ref 4.5–5.6)

## 2018-06-06 MED ORDER — INSULIN GLARGINE 100 UNIT/ML ~~LOC~~ SOLN: 30.0000 [IU] | Freq: Two times a day (BID) | SUBCUTANEOUS | 11 refills | Status: AC

## 2018-06-06 MED ORDER — INSULIN GLARGINE 100 UNIT/ML ~~LOC~~ SOLN
30.0000 [IU] | Freq: Two times a day (BID) | SUBCUTANEOUS | Status: DC
  Administered 2018-06-06: 30 [IU] via SUBCUTANEOUS
  Filled 2018-06-06 (×4): qty 0.3

## 2018-06-06 NOTE — Progress Notes (Signed)
Discharge teaching given to patient, and nurse case worker from community nursing patient verbalized understanding and had no questions as well as Designer, television/film set. Patient IV removed. Patient will be transported home by nurse. All patient belongings gathered prior to leaving.

## 2018-06-06 NOTE — Progress Notes (Addendum)
Inpatient Diabetes Program Recommendations  AACE/ADA: New Consensus Statement on Inpatient Glycemic Control  Target Ranges:  Prepandial:   less than 140 mg/dL      Peak postprandial:   less than 180 mg/dL (1-2 hours)      Critically ill patients:  140 - 180 mg/dL  Results for SCOUT, GUMBS (MRN [id number]) as of 06/06/2018 08:17  Ref. Range 06/05/2018 02:27 06/05/2018 03:55 06/05/2018 5:53 06/05/2018 07:14 06/05/2018 08:18 06/05/2018 09:43 06/05/2018 12:06 06/05/2018 17:05 06/05/2018 21:15 06/06/2018 07:34  Glucose-Capillary Latest Ref Range: 70 - 99 mg/dL 128 (H) 213 (H)   Lantus 10 units 252 (H) 298 (H)  Lantus 25 units @8 :36 330 (H) 300 (H)  Novolog 11 units 367 (H)  Novolog 26 units 255 (H)  Novolog 3 units  Lantus 25 units @22 :11 150 (H)  Results for KWELI, GRASSEL (MRN [id number]) as of 06/06/2018 12:04  Ref. Range 06/06/2018 03:37  Hemoglobin A1C Latest Ref Range: 4.8 - 5.6 % 16.3 (H)   Review of Glycemic Control  Diabetes history: DM2 Outpatient Diabetes medications: Lantus 52 units daily Current orders for Inpatient glycemic control: Lantus 25 units QHS, Novolog 5 units TID with meals, Novolog 0-20 units TID with meals, Novolog 0-5 units QHS  Inpatient Diabetes Program Recommendations: Insulin - Basal: Patient received Lantus 10 units at 5:53 am, Lantus 25 units at 8:36 am, and Lantus 25 untis at 22:11 on 06/05/18 (total of 60 units on 06/05/18). Please consider increasing Lantus to 30 units BID.  Addendum 06/06/18@11 :00-Spoke with patient about diabetes and home regimen for diabetes control. Patient kept getting off topic during discussion; he was focused on saying, "I need someone to help me at home. I keep falling at home and I told the doctor I am sick. I don't need to go home yet. If I do, I will come right back." Constantly had to redirect patient back to discussing DM.  Patient reports that he is followed by PCP and takes Lantus 52 units daily as an outpatient for diabetes control.  Patient  reports that he is taking insulin consistently every day at home. Patient is administering insulin in his abdomen. Examined abdomen and no scar tissue or hardened tissue noted. Reviewed importance of insulin injection site rotation.  Patient was able to verbalize how to use an insulin pen for injections. Patient states that he checks his glucose 1-2 times per day and that it is ranges from 100-200's mg/dl.  Discussed A1C results (16.3% on 06/06/18) and explained that his current A1C indicates an average glucose of 421 mg/dl over the past 2-3 months. Explained that his A1C is indicating that his glucose is running much higher than he reports. Discussed glucose and A1C goals. Discussed importance of checking CBGs and maintaining good CBG control to prevent long-term and short-term complications.  Stressed to the patient the importance of improving glycemic control to prevent further complications from uncontrolled diabetes. Patient again stated "I told you people I am sick. I need some help at home. I can't do everything by myself." Patient reports he has no family in the area and states that he does not have any friends or neighbors that can help him at home.   Informed patient I would discuss with CM to see if he was able to get assistance at home.  Reiterated that he absolutely needs to be checking glucose at home 3-4 (before meals and at bedtime) and taking the insulin injection every day at the same time. Patient reports that ACT team follows  outpatient and they send a case worker out to visit once every 2 weeks.  Patient verbalized understanding of information discussed and he states that he has no further questions at this time related to diabetes. Discussed with Colletta Maryland, RN, CM regarding patient request for assistance at home. Patient appears to have a lack of understanding about importance of glycemic control and is only willing to take one insulin injection per day.  Patient is very high risk for readmission  and needs to have someone help him daily to ensure he is monitoring glucose and taking insulin.  Thanks, Barnie Alderman, RN, MSN, CDE Diabetes Coordinator Inpatient Diabetes Program [phone] (Team Pager from 8am to 5pm)

## 2018-06-06 NOTE — Progress Notes (Signed)
[facility], [place] 06/06/18  Subjective:   Patient states he still feels a little dizzy this morning Serum creatinine has improved to 2.4 Urine output 1000 cc Potassium slightly low at 3.0 He was able to eat 100% of his breakfast this morning  Objective:  Vital signs in last 24 hours:  Temp:  [97.5 F (36.4 C)-98.5 F (36.9 C)] 98.5 F (36.9 C) (01/08 0542) Pulse Rate:  [65-95] 66 (01/08 1008) Resp:  [15-25] 20 (01/08 0542) BP: (89-153)/(54-89) 89/54 (01/08 1008) SpO2:  [92 %-100 %] 97 % (01/08 0542)  Weight change:  Filed Weights   06/04/18 1448  Weight: 90.7 kg    Intake/Output:    Intake/Output Summary (Last 24 hours) at 06/06/2018 1136 Last data filed at 06/06/2018 1108 Gross per 24 hour  Intake 720 ml  Output 1800 ml  Net -1080 ml     Physical Exam: General: Laying in bed, NAD  HEENT Anicteric, moist oral mucus membranes  Neck supple  Pulm/lungs Clear to ausculatation b/l  CVS/Heart Regular, n rub  Abdomen:  Soft, obese, NT  Extremities: No edema  Neurologic: Alert, oriented  Skin: No acute rashes    Basic Metabolic Panel:  Recent Labs  Lab 06/04/18 1600 06/05/18 0246 06/05/18 1011 06/06/18 0337  NA 126* 135 133* 139  K 3.3* 2.7* 3.2* 3.0*  CL 83* 99 100 105  CO2 19* 23 24 23   GLUCOSE 640* 125* 275* 111*  BUN 90* 71* 58* 49*  CREATININE 4.30* 3.16* 2.60* 2.43*  CALCIUM 9.5 8.6* 8.4* 9.4  MG  --   --  2.2  --   PHOS  --   --  2.0*  --      CBC: Recent Labs  Lab 06/04/18 1514 06/05/18 1309  WBC 4.3 4.6  NEUTROABS  --  1.7  HGB 15.7 14.2  HCT 45.6 42.1  MCV 84.1 84.9  PLT 198 165      Lab Results  Component Value Date   HEPBSAG Negative 11/06/2016      Microbiology:  No results found for this or any previous visit (from the past 240 hour(s)).  Coagulation Studies: No results for input(s): LABPROT, INR in the last 72 hours.  Urinalysis: Recent Labs    06/04/18 1514  COLORURINE STRAW*   LABSPEC 1.020  PHURINE 5.0  GLUCOSEU >=500*  HGBUR SMALL*  BILIRUBINUR NEGATIVE  KETONESUR 20*  PROTEINUR NEGATIVE  NITRITE NEGATIVE  LEUKOCYTESUR NEGATIVE      Imaging: Dg Chest 2 View  Result Date: 06/04/2018 CLINICAL DATA:  Fall EXAM: CHEST - 2 VIEW COMPARISON:  01/05/2018 chest radiograph. FINDINGS: Stable cardiomediastinal silhouette with normal heart size. No pneumothorax. No pleural effusion. Lungs appear clear, with no acute consolidative airspace disease and no pulmonary edema. No displaced fractures in the visualized chest. IMPRESSION: No active cardiopulmonary disease. Electronically Signed   By: Ilona Sorrel M.D.   On: 06/04/2018 16:14   Ct Head Wo Contrast  Result Date: 06/04/2018 CLINICAL DATA:  Multiple falls today.  Generalized weakness. EXAM: CT HEAD WITHOUT CONTRAST TECHNIQUE: Contiguous axial images were obtained from the base of the skull through the vertex without intravenous contrast. COMPARISON:  12/06/2017; 11/05/2016 FINDINGS: Brain: Gray-white differentiation is maintained. No CT evidence of acute large territory infarct. No intraparenchymal or extra-axial mass or hemorrhage. Normal size and configuration of the ventricles and the basilar cisterns. No midline shift. Vascular: No hyperdense vessel or unexpected calcification. Skull: No displaced calvarial fracture. Sinuses/Orbits: There is underpneumatization of  the bilateral frontal sinuses. The remaining paranasal sinuses and mastoid air cells are normally aerated. No air-fluid levels. Debris is seen within the bilateral external auditory canals. Other: Regional soft tissues appear normal. No radiopaque foreign body. IMPRESSION: Negative noncontrast head CT. Electronically Signed   By: Sandi Mariscal M.D.   On: 06/04/2018 15:55     Medications:    . cloZAPine  200 mg Oral BID  . divalproex  500 mg Oral BID  . heparin  5,000 Units Subcutaneous Q8H  . insulin aspart  0-20 Units Subcutaneous TID WC  . insulin aspart   0-5 Units Subcutaneous QHS  . insulin aspart  6 Units Subcutaneous TID WC  . insulin glargine  30 Units Subcutaneous BID  . metoprolol succinate  50 mg Oral Daily  . perphenazine  8 mg Oral BID  . potassium chloride  20 mEq Oral BID  . prazosin  2 mg Oral Daily  . temazepam  30 mg Oral QHS   acetaminophen **OR** acetaminophen, bisacodyl, ondansetron **OR** ondansetron (ZOFRAN) IV, senna-docusate  Assessment/ Plan:  53 y.o.African American male with Schizoaffective disorder (Followed by [facility]), hypertension, GERD, prior episode of acute renal failure, diabetes mellitus type 2, insulin dependent    1.  Acute kidney injury, likely secondary to dehydration from hyperglycemia Serum creatinine improving as expected.  Close to baseline. Outpatient follow-up  2.  Hypokalemia Continue oral potassium replacement  3.  Chronic kidney disease st 3.  Baseline creatinine 2.40/GFR 35 from January 05, 2018  4.  Diabetes type 2, insulin-dependent with CKD.  Poorly controlled Recommend diabetes education and dietary education while inpatient HbA1c 16.3 % Outpatient follow up   LOS: [place] 1/8/202011:36 AM  [place], [place]  Note: This note was prepared with Dragon dictation. Any transcription errors are unintentional

## 2018-06-06 NOTE — Care Management Note (Signed)
Case Management Note  Patient Details  Name: Nicholas Cole MRN: [id number] Date of Birth: Mar 05, 1966   Patient to discharge home today.  Patient states that he lives at home alone.  Patient states that he has no family that lives locally.  Patient states that he is part of the [facility] program and his case worker's name is Advertising account executive.  Patient states that he has no DME at home.  Patient confirms he has a working glucometer.  Patient states "I do my own insulin just like I am supposed to".  However patient A1C is 16.3.  Home health orders have been written for RN PT and aide.  Patient declined PT eval while inpatient.  Patient agreeable to home health services.  CMS Medicare.gov Compare Post Acute Care list reviewed with patient.  Patient states he does not have a preference of agency.  Referral made to [facility] with [facility].  RW delivered to room prior to discharge by [facility] from [place].   Diabetes Coordinator requests home daily visits for CBG and insulin administration.  Insurance does not cover daily visits for home health.  RNCM discussed out of pocket services, and potential additional services he may qualify through his Medicaid. At that time patient became defensive and states "I don't want anyone else coming to my home other the the home health, I do just fine with my diabetes"  RNCM attempted to discuss A1C, however patient did not want to continue conversation.  Diabetes coordinatior updated.  RNCM reached out to [facility] team.  RNCM spoke to [facility] who is the nurse that follows the patient in the community.  RNCM updated her as well on the concern of patient's A1C.  Ana states that [facility] provides the patient with all of his medications including Lantus [place].    Subjective/Objective:                    Action/Plan:   Expected Discharge Date:  06/06/18               Expected Discharge Plan:  [name]  In-House Referral:     Discharge planning Services  CM  Consult  Post Acute Care Choice:  Durable Medical Equipment, Home Health Choice offered to:  Patient  DME Arranged:  [place] DME Agency:  [place] Arranged:  RN, PT, Nurse's Aide [place] Agency:  [place]  Status of Service:  Completed, signed off  If discussed at [place] of Stay Meetings, dates discussed:    Additional Comments:  Beverly Sessions, RN 06/06/2018, 3:56 PM

## 2018-06-06 NOTE — Progress Notes (Signed)
PT Cancellation Note  Patient Details Name: Nicholas Cole MRN: [id number] DOB: Nov 06, 1965   Cancelled Treatment:    Reason Eval/Treat Not Completed:  Discussed with pt at length the purpose of a PT evaluation with the ultimate goal of a safe discharge plan especially with pt complaining of recurrent falls at home.  Pt declined PT evaluation.  Gentle encouragement provided and again discussed concerns related to recent falls with pt again and more adamantly refusing PT services, nursing notified.     Linus Salmons PT, DPT 06/06/18, 11:27 AM

## 2018-06-07 LAB — HEMOGLOBIN A1C: Mean Plasma Glucose: >398 mg/dL

## 2018-06-07 NOTE — Discharge Summary (Signed)
[facility] [place] NAME: Nicholas Cole    MR#:  [id number]  DATE OF BIRTH:  04-18-1966  DATE OF ADMISSION:  06/04/2018 ADMITTING PHYSICIAN: Bettey Costa, MD  DATE OF DISCHARGE: 06/06/2018  PRIMARY CARE PHYSICIAN: Lorelee Market, MD    ADMISSION DIAGNOSIS:  Uremia [N19] Acute renal failure, unspecified acute renal failure type ([place]) [N17.9] Diabetic ketoacidosis without coma associated with other specified diabetes mellitus ([place]) [E13.10] DKA (diabetic ketoacidoses) ([place]) [E11.10]  DISCHARGE DIAGNOSIS:  Active Problems:   DKA (diabetic ketoacidoses) ([place])   SECONDARY DIAGNOSIS:   Past Medical History:  Diagnosis Date  . Acute renal failure (ARF) ([place])   . Diabetes mellitus without complication ([place])   . Schizophrenia [facility])     HOSPITAL COURSE:   53 year old male with history of diabetes who presented with DKA.  1.  DKA: Patient was on DKA protocol.  DKA has resolved.  He will continue with outpatient regimen.  His nurse has requested increasing Lantus to 30 units twice a day.  He will follow-up with his primary care physician.   2.  Acute kidney injury with chronic kidney disease stage III: Patient was evaluated by nephrology.  Acute kidney injury was in the setting of DKA and dehydration.  Creatinine has improved with IV fluids.  He will follow-up with nephrology as an outpatient.  3.  Essential hypertension: Continue metoprolol  4.  Hypokalemia: This was repleted.  5. Tobacco dependence: Patient is encouraged to quit smoking. Counseling was provided for 4 minutes.     DISCHARGE CONDITIONS AND DIET:   Stable Heart healthy diabetic dietr  CONSULTS OBTAINED:  Treatment Team:  Arta Silence, MD Murlean Iba, MD  DRUG ALLERGIES:   Allergies  Allergen Reactions  . Valproic Acid Rash and Other (See Comments)    Hyperammonemia    DISCHARGE MEDICATIONS:   Allergies as of 06/06/2018      Reactions    Valproic Acid Rash, Other (See Comments)   Hyperammonemia      Medication List    STOP taking these medications   Insulin Glargine 100 UNIT/ML Solostar Pen Commonly known as:  LANTUS SOLOSTAR Replaced by:  insulin glargine 100 UNIT/ML injection     TAKE these medications   clozapine 200 MG tablet Commonly known as:  CLOZARIL Take 1 tablet (200 mg total) by mouth 2 (two) times daily.   divalproex 500 MG DR tablet Commonly known as:  DEPAKOTE Take 1 tablet (500 mg total) by mouth 2 (two) times daily.   insulin glargine 100 UNIT/ML injection Commonly known as:  LANTUS Inject 0.3 mLs (30 Units total) into the skin 2 (two) times daily. Replaces:  Insulin Glargine 100 UNIT/ML Solostar Pen   metoprolol succinate 50 MG 24 hr tablet Commonly known as:  TOPROL-XL Take 1 tablet (50 mg total) by mouth daily.   perphenazine 8 MG tablet Commonly known as:  TRILAFON Take 1 tablet (8 mg total) by mouth 2 (two) times daily.   prazosin 2 MG capsule Commonly known as:  MINIPRESS Take 1 capsule (2 mg total) by mouth daily.   temazepam 30 MG capsule Commonly known as:  RESTORIL Take 1 capsule (30 mg total) by mouth at bedtime.   Vitamin D (Ergocalciferol) 1.25 MG (50000 UT) Caps capsule Commonly known as:  DRISDOL Take 1 capsule (50,000 Units total) by mouth every 7 (seven) days.         Today   CHIEF COMPLAINT:  Doing ok this am DKA  resolved   VITAL SIGNS:  Blood pressure 92/64, pulse 94, temperature 98.4 F (36.9 C), temperature source Oral, resp. rate 18, height 5\' 9"  (1.753 m), weight 90.7 kg, SpO2 96 %.   REVIEW OF SYSTEMS:  Review of Systems  Constitutional: Positive for malaise/fatigue. Negative for chills and fever.  HENT: Negative.  Negative for ear discharge, ear pain, hearing loss, nosebleeds and sore throat.   Eyes: Negative.  Negative for blurred vision and pain.  Respiratory: Negative.  Negative for cough, hemoptysis, shortness of breath and wheezing.    Cardiovascular: Negative.  Negative for chest pain, palpitations and leg swelling.  Gastrointestinal: Negative.  Negative for abdominal pain, blood in stool, diarrhea, nausea and vomiting.  Genitourinary: Negative.  Negative for dysuria.  Musculoskeletal: Negative.  Negative for back pain.  Skin: Negative.   Neurological: Negative for dizziness, tremors, speech change, focal weakness, seizures and headaches.  Endo/Heme/Allergies: Negative.  Does not bruise/bleed easily.  Psychiatric/Behavioral: Negative.  Negative for depression, hallucinations and suicidal ideas.     PHYSICAL EXAMINATION:  GENERAL:  53 y.o.-year-old patient lying in the bed with no acute distress.  NECK:  Supple, no jugular venous distention. No thyroid enlargement, no tenderness.  LUNGS: Normal breath sounds bilaterally, no wheezing, rales,rhonchi  No use of accessory muscles of respiration.  CARDIOVASCULAR: S1, S2 normal. No murmurs, rubs, or gallops.  ABDOMEN: Soft, non-tender, non-distended. Bowel sounds present. No organomegaly or mass.  EXTREMITIES: No pedal edema, cyanosis, or clubbing.  PSYCHIATRIC: The patient is alert and oriented x 3.  SKIN: No obvious rash, lesion, or ulcer.   DATA REVIEW:   CBC Recent Labs  Lab 06/05/18 1309  WBC 4.6  HGB 14.2  HCT 42.1  PLT 165    Chemistries  Recent Labs  Lab 06/05/18 1011 06/06/18 0337  NA 133* 139  K 3.2* 3.0*  CL 100 105  CO2 24 23  GLUCOSE 275* 111*  BUN 58* 49*  CREATININE 2.60* 2.43*  CALCIUM 8.4* 9.4  MG 2.2  --   AST 12*  --   ALT 9  --   ALKPHOS 59  --   BILITOT 1.1  --     Cardiac Enzymes Recent Labs  Lab 06/04/18 1600  TROPONINI <0.03    Microbiology Results  @[id number] @  RADIOLOGY:  No results found.    Allergies as of 06/06/2018      Reactions   Valproic Acid Rash, Other (See Comments)   Hyperammonemia      Medication List    STOP taking these medications   Insulin Glargine 100 UNIT/ML Solostar Pen Commonly  known as:  LANTUS SOLOSTAR Replaced by:  insulin glargine 100 UNIT/ML injection     TAKE these medications   clozapine 200 MG tablet Commonly known as:  CLOZARIL Take 1 tablet (200 mg total) by mouth 2 (two) times daily.   divalproex 500 MG DR tablet Commonly known as:  DEPAKOTE Take 1 tablet (500 mg total) by mouth 2 (two) times daily.   insulin glargine 100 UNIT/ML injection Commonly known as:  LANTUS Inject 0.3 mLs (30 Units total) into the skin 2 (two) times daily. Replaces:  Insulin Glargine 100 UNIT/ML Solostar Pen   metoprolol succinate 50 MG 24 hr tablet Commonly known as:  TOPROL-XL Take 1 tablet (50 mg total) by mouth daily.   perphenazine 8 MG tablet Commonly known as:  TRILAFON Take 1 tablet (8 mg total) by mouth 2 (two) times daily.   prazosin 2 MG capsule Commonly known as:  MINIPRESS Take 1 capsule (2 mg total) by mouth daily.   temazepam 30 MG capsule Commonly known as:  RESTORIL Take 1 capsule (30 mg total) by mouth at bedtime.   Vitamin D (Ergocalciferol) 1.25 MG (50000 UT) Caps capsule Commonly known as:  DRISDOL Take 1 capsule (50,000 Units total) by mouth every 7 (seven) days.         Management plans discussed with the patient and he is in agreement. Stable for discharge with [place]  Patient should follow up with pcp  CODE STATUS:     Code Status Orders  (From admission, onward)         Start     Ordered   06/05/18 0658  Full code  Continuous     06/05/18 0658        Code Status History    Date Active Date Inactive Code Status Order ID Comments User Context   12/06/2017 1559 12/12/2017 1730 Full Code [id number]  Henreitta Leber, MD Inpatient   01/27/2017 2206 02/07/2017 1836 Full Code [id number]  Gonzella Lex, MD Inpatient   11/05/2016 2205 11/11/2016 1643 Full Code [id number]  Idelle Crouch, MD Inpatient      TOTAL TIME TAKING CARE OF THIS PATIENT: 38 minutes.    Note: This dictation was prepared with Dragon dictation along  with smaller phrase technology. Any transcriptional errors that result from this process are unintentional.  Champion Corales M.D on 06/07/2018 at 9:38 AM  Between 7am to 6pm - Pager - [phone] After 6pm go to [email or web address] - password [email or web address] [facility]  Office  [phone]  CC: Primary care physician; Lorelee Market, MD

## 2018-06-11 DIAGNOSIS — E1122 Type 2 diabetes mellitus with diabetic chronic kidney disease: Secondary | ICD-10-CM | POA: Diagnosis not present

## 2018-06-11 DIAGNOSIS — Z87891 Personal history of nicotine dependence: Secondary | ICD-10-CM | POA: Diagnosis not present

## 2018-06-11 DIAGNOSIS — Z794 Long term (current) use of insulin: Secondary | ICD-10-CM | POA: Diagnosis not present

## 2018-06-11 DIAGNOSIS — Z9181 History of falling: Secondary | ICD-10-CM | POA: Diagnosis not present

## 2018-06-11 DIAGNOSIS — N184 Chronic kidney disease, stage 4 (severe): Secondary | ICD-10-CM | POA: Diagnosis not present

## 2018-06-11 DIAGNOSIS — I129 Hypertensive chronic kidney disease with stage 1 through stage 4 chronic kidney disease, or unspecified chronic kidney disease: Secondary | ICD-10-CM | POA: Diagnosis not present

## 2018-06-11 DIAGNOSIS — E1165 Type 2 diabetes mellitus with hyperglycemia: Secondary | ICD-10-CM | POA: Diagnosis not present

## 2018-06-30 DEATH — deceased

## 2018-10-19 IMAGING — CT CT HEAD W/O CM
3 series · 15 of 47 positions shown, 18 images · non-contrast
Comparison: CT head 11/05/2016.

CLINICAL DATA: Altered mental status with lethargy and
hyperglycemia. History of diabetes and renal failure.

EXAM:
CT HEAD WITHOUT CONTRAST
TECHNIQUE: Contiguous axial images were obtained from the base of the skull
through the vertex without intravenous contrast.

[Series 2: head wo · axial · 0.47mm/px · z∈[-103,+22]mm · 9 of 30 slices shown, 12 images]
[im 3/30  brain]
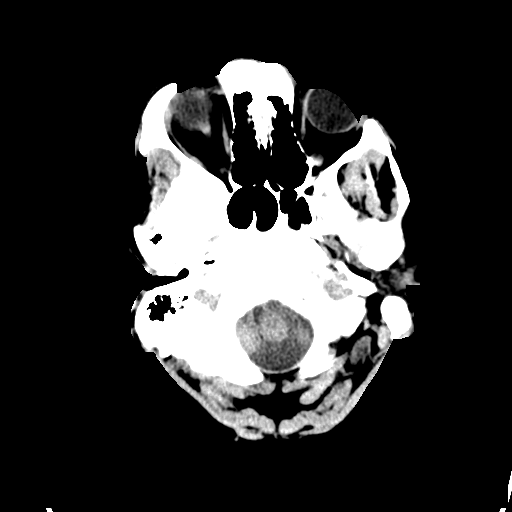
[im 3/30  bone]
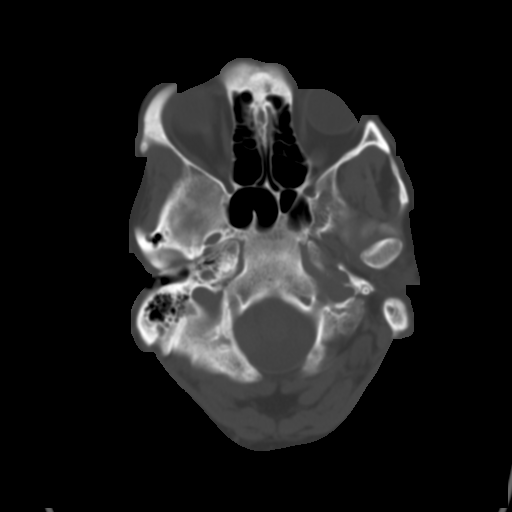
[im 6/30  brain]
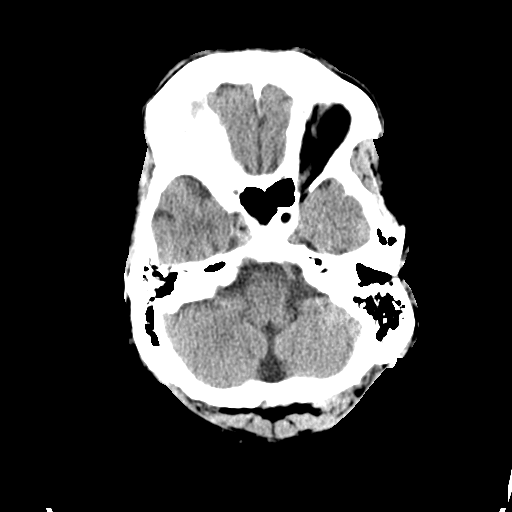
[im 9/30  brain]
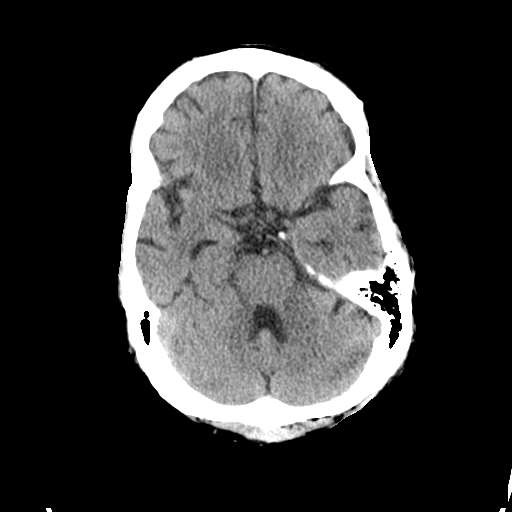
[im 12/30  brain]
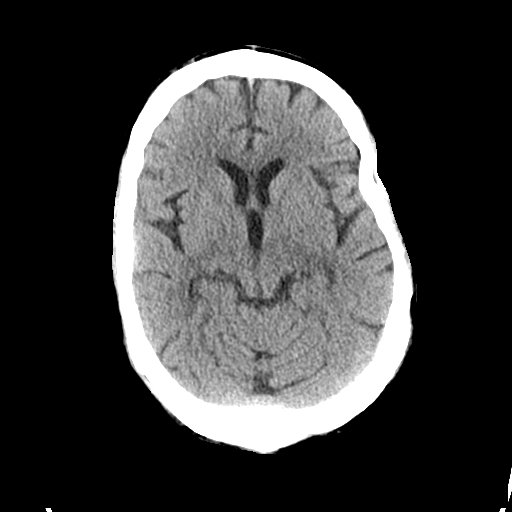
[im 16/30  brain]
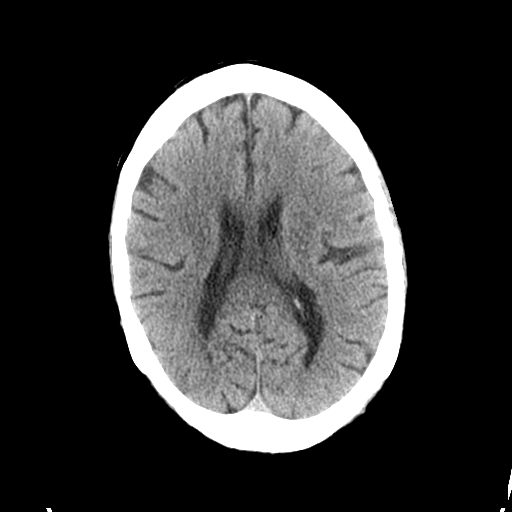
[im 16/30  bone]
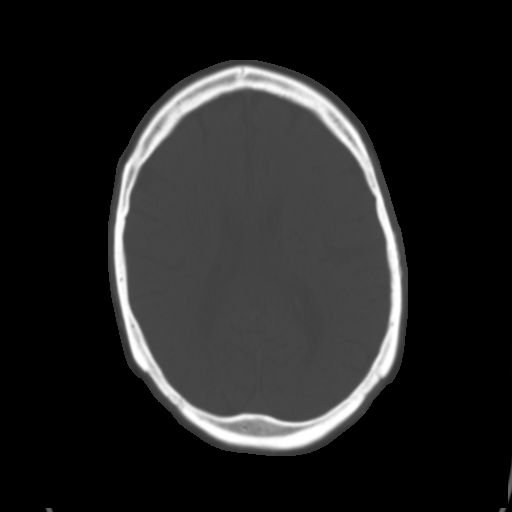
[im 19/30  brain]
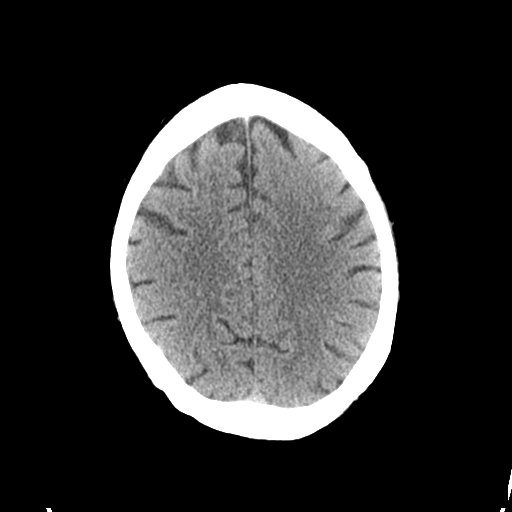
[im 22/30  brain]
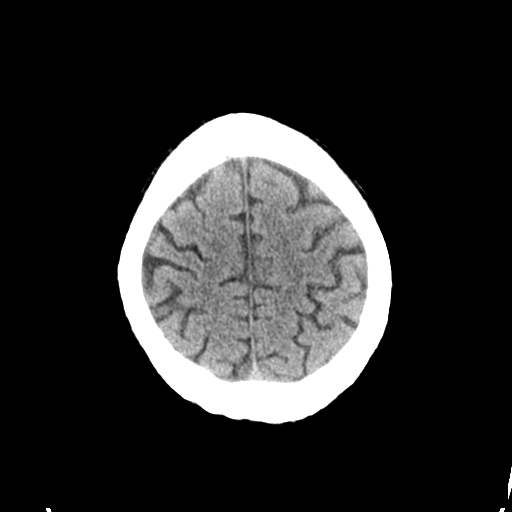
[im 25/30  brain]
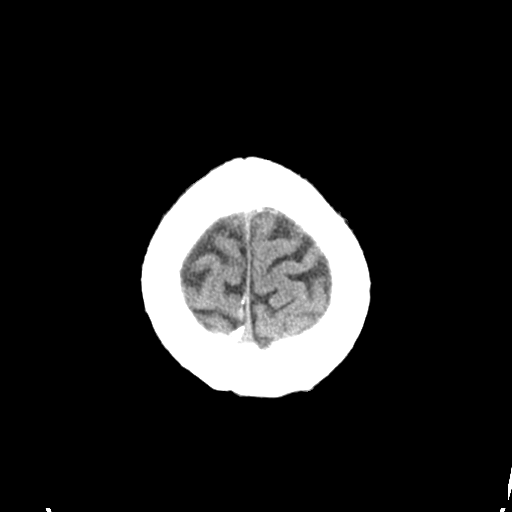
[im 28/30  brain]
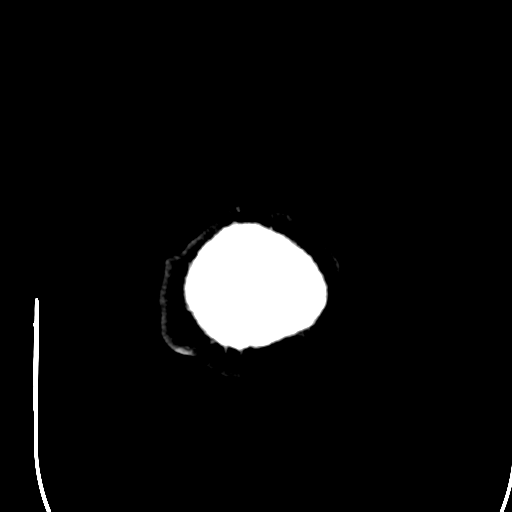
[im 28/30  bone]
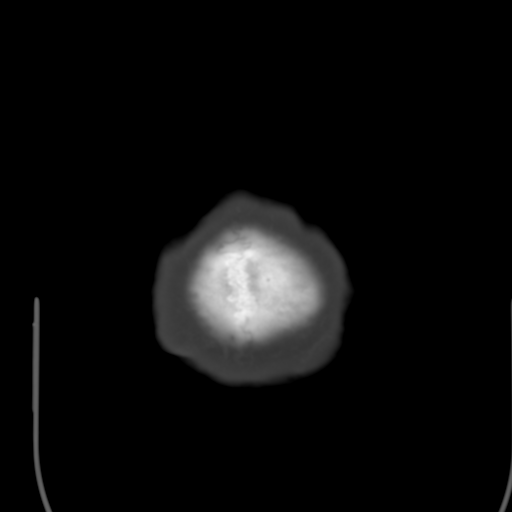

[Series 4: coronal soft tissue · coronal · 0.30mm/px · 3 of 64 slices shown]
[im 22/64  brain]
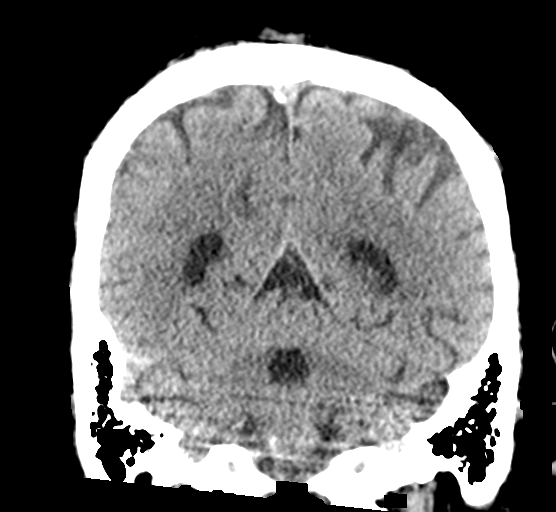
[im 29/64  brain]
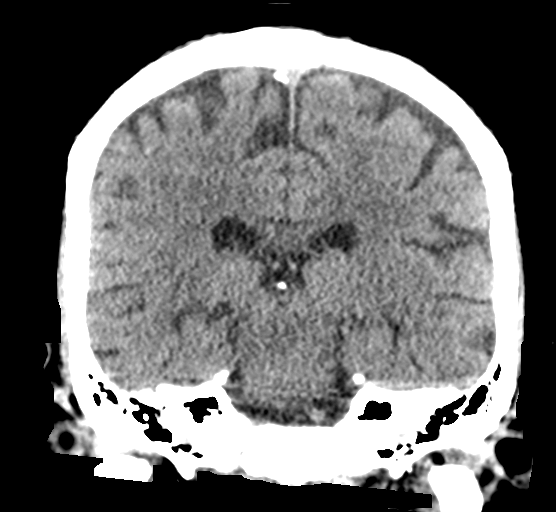
[im 36/64  brain]
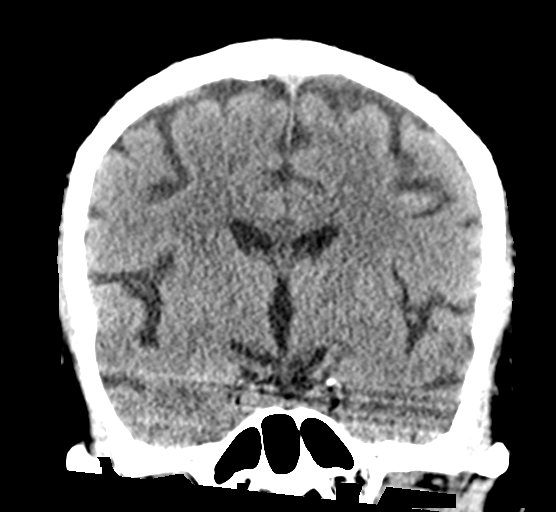

[Series 5: sagittal soft tissue · sagittal · 0.30mm/px · 3 of 56 slices shown]
[im 19/56  brain]
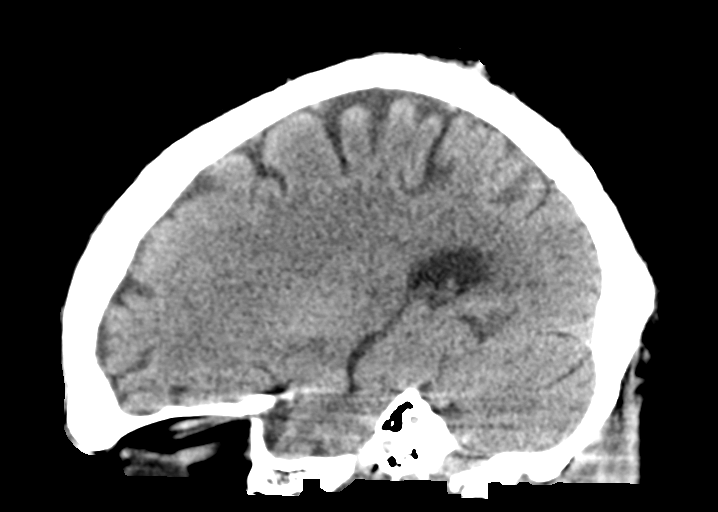
[im 28/56  brain]
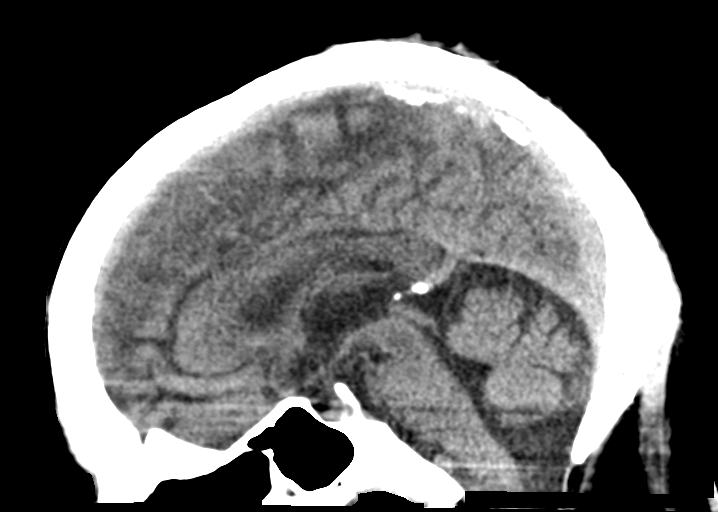
[im 37/56  brain]
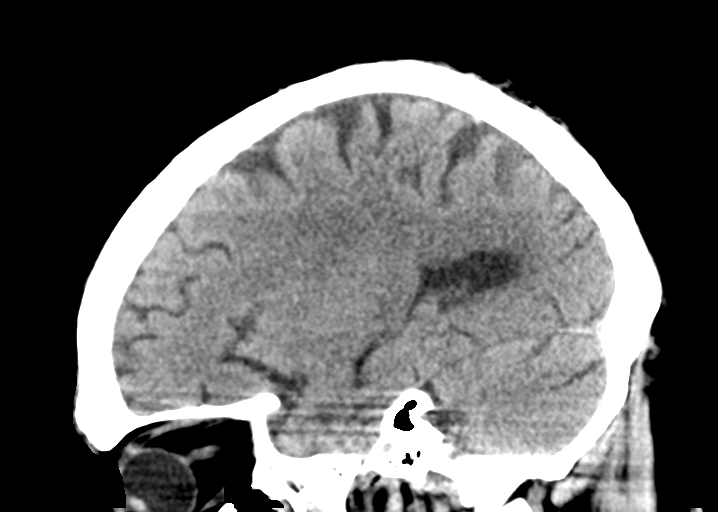

[15 of 47 positions shown; findings below may reference images not displayed]

FINDINGS: Brain: There is no evidence of acute intracranial hemorrhage, mass
lesion, brain edema or extra-axial fluid collection. The ventricles
and subarachnoid spaces are appropriately sized for age. There is no
CT evidence of acute cortical infarction.

Vascular:  No hyperdense vessel identified.

Skull: Negative for fracture or focal lesion.

Sinuses/Orbits: The visualized paranasal sinuses and mastoid air
cells are clear. No orbital abnormalities are seen.

Other: None.
IMPRESSION: Stable unremarkable noncontrast head CT.

## 2018-11-18 IMAGING — CR DG CHEST 2V
1 series · 2 of 2 positions shown · non-contrast
Comparison: [DATE] [DATE], [DATE], [DATE] [DATE], [DATE]

CLINICAL DATA: Central chest pain starting today

EXAM:
CHEST - 2 VIEW

[Series 1: dg chest 2 view · 0.14mm/px · 2 of 2 slices shown]
[im 1/2]
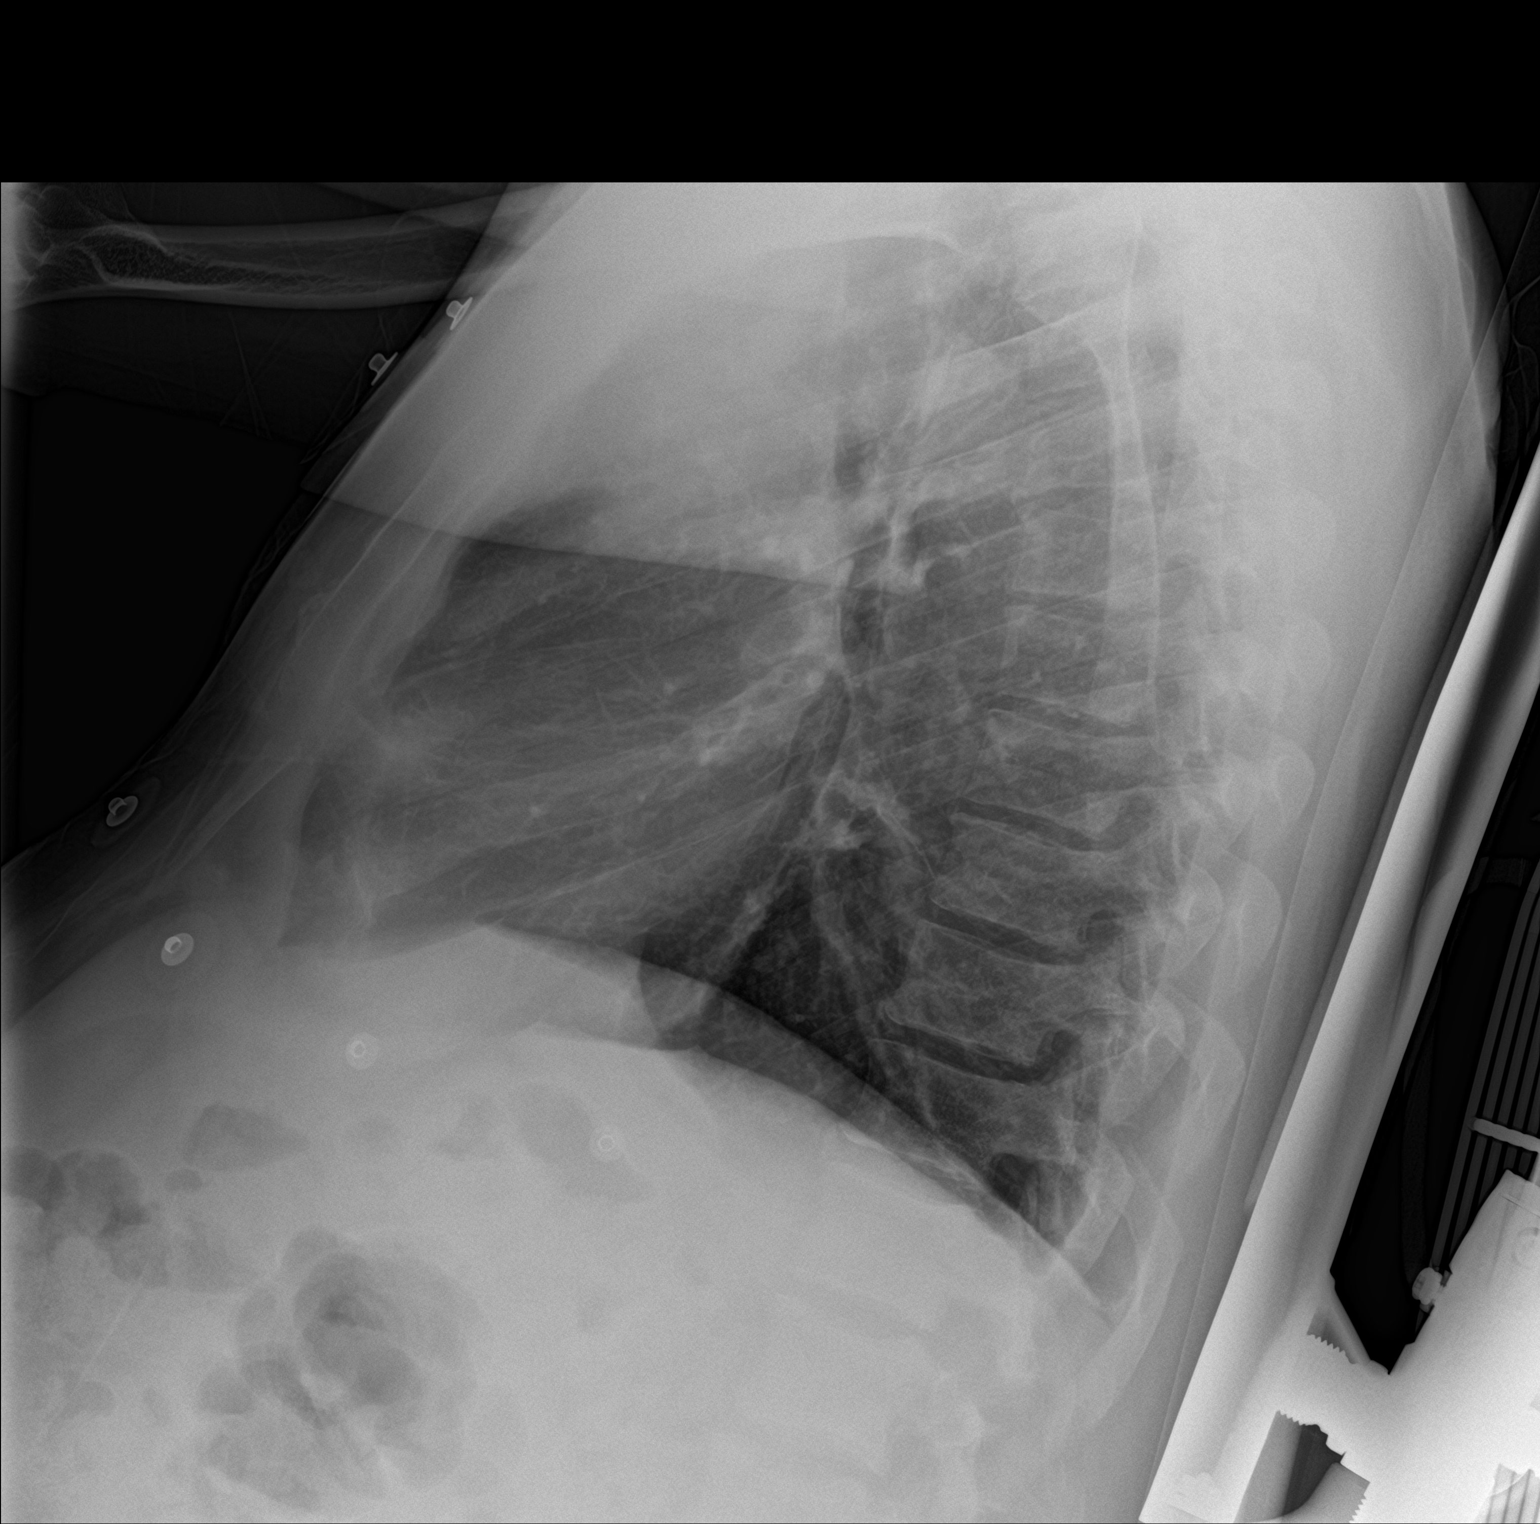
[im 2/2]
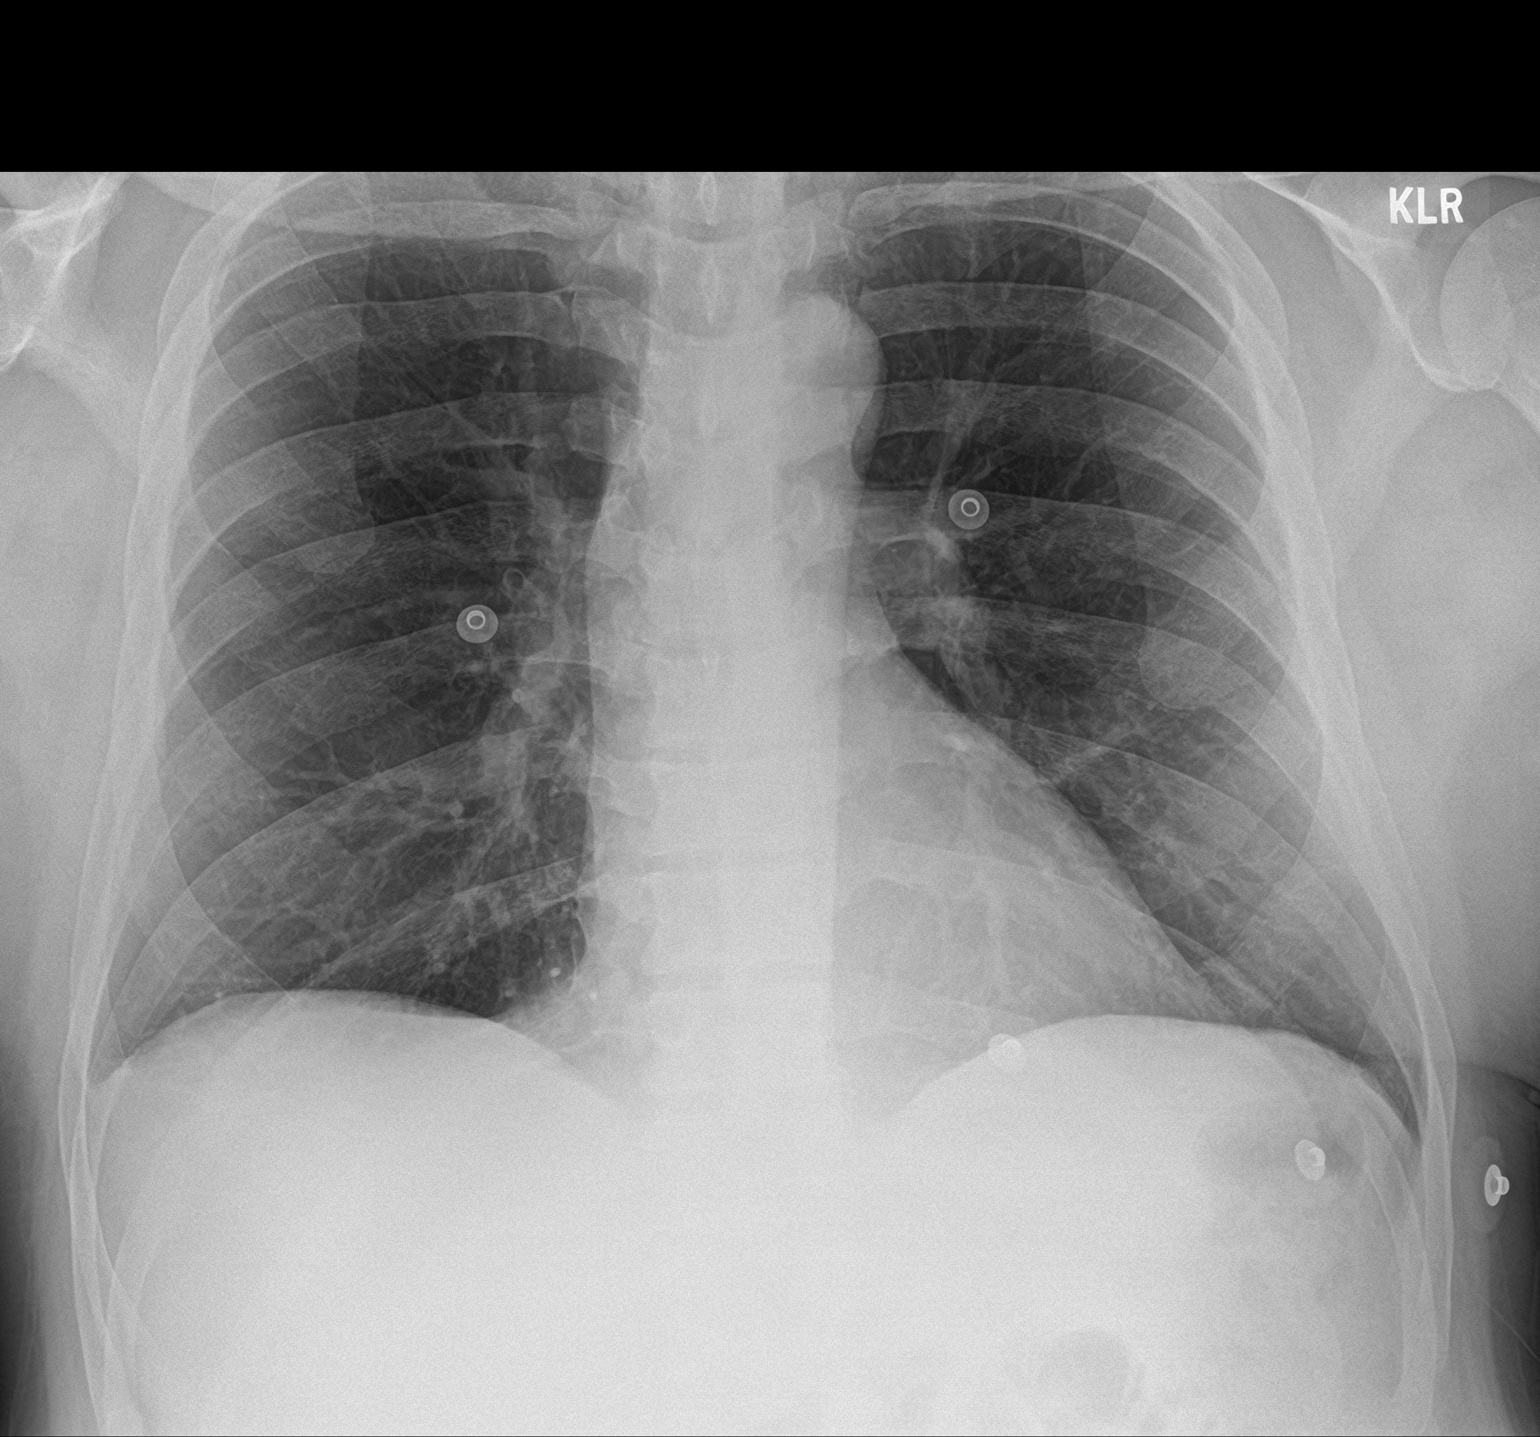

[2 of 2 positions shown; findings below may reference images not displayed]

FINDINGS: The heart size and mediastinal contours are within normal limits.
Both lungs are clear. The visualized skeletal structures are
unremarkable.
IMPRESSION: No active cardiopulmonary disease.
